# Patient Record
Sex: Female | Born: 1985 | Race: White | Hispanic: No | Marital: Single | Smoking: Never smoker
Health system: Southern US, Community
[De-identification: ages and names within clinical notes are randomized; demographics above are authoritative.]

## PROBLEM LIST (undated history)

## (undated) VITALS — BP 135/93 | HR 84 | Temp 97.7°F | Resp 14 | Ht 63.0 in | Wt 194.0 lb

## (undated) DIAGNOSIS — K567 Ileus, unspecified: Secondary | ICD-10-CM

## (undated) DIAGNOSIS — Z9889 Other specified postprocedural states: Secondary | ICD-10-CM

## (undated) DIAGNOSIS — E282 Polycystic ovarian syndrome: Secondary | ICD-10-CM

## (undated) DIAGNOSIS — I10 Essential (primary) hypertension: Secondary | ICD-10-CM

## (undated) DIAGNOSIS — F329 Major depressive disorder, single episode, unspecified: Secondary | ICD-10-CM

## (undated) DIAGNOSIS — G894 Chronic pain syndrome: Secondary | ICD-10-CM

## (undated) DIAGNOSIS — E3121 Multiple endocrine neoplasia [MEN] type I: Secondary | ICD-10-CM

## (undated) DIAGNOSIS — R112 Nausea with vomiting, unspecified: Secondary | ICD-10-CM

## (undated) DIAGNOSIS — G43909 Migraine, unspecified, not intractable, without status migrainosus: Secondary | ICD-10-CM

## (undated) DIAGNOSIS — F32A Depression, unspecified: Secondary | ICD-10-CM

## (undated) DIAGNOSIS — E213 Hyperparathyroidism, unspecified: Secondary | ICD-10-CM

## (undated) DIAGNOSIS — C801 Malignant (primary) neoplasm, unspecified: Secondary | ICD-10-CM

## (undated) DIAGNOSIS — F319 Bipolar disorder, unspecified: Secondary | ICD-10-CM

## (undated) DIAGNOSIS — S82899A Other fracture of unspecified lower leg, initial encounter for closed fracture: Secondary | ICD-10-CM

## (undated) DIAGNOSIS — F29 Unspecified psychosis not due to a substance or known physiological condition: Secondary | ICD-10-CM

## (undated) DIAGNOSIS — Z8489 Family history of other specified conditions: Secondary | ICD-10-CM

## (undated) DIAGNOSIS — M436 Torticollis: Secondary | ICD-10-CM

## (undated) DIAGNOSIS — M797 Fibromyalgia: Secondary | ICD-10-CM

## (undated) DIAGNOSIS — E063 Autoimmune thyroiditis: Secondary | ICD-10-CM

## (undated) DIAGNOSIS — Z87442 Personal history of urinary calculi: Secondary | ICD-10-CM

## (undated) DIAGNOSIS — K219 Gastro-esophageal reflux disease without esophagitis: Secondary | ICD-10-CM

## (undated) DIAGNOSIS — E039 Hypothyroidism, unspecified: Secondary | ICD-10-CM

## (undated) DIAGNOSIS — D352 Benign neoplasm of pituitary gland: Secondary | ICD-10-CM

## (undated) DIAGNOSIS — I499 Cardiac arrhythmia, unspecified: Secondary | ICD-10-CM

## (undated) DIAGNOSIS — K08409 Partial loss of teeth, unspecified cause, unspecified class: Secondary | ICD-10-CM

## (undated) HISTORY — PX: PARATHYROIDECTOMY: SHX19

## (undated) HISTORY — PX: LAPAROSCOPY: SHX197

## (undated) HISTORY — PX: DILATION AND CURETTAGE OF UTERUS: SHX78

## (undated) HISTORY — PX: OTHER SURGICAL HISTORY: SHX169

## (undated) HISTORY — PX: THYROIDECTOMY: SHX17

## (undated) HISTORY — PX: DENTAL SURGERY: SHX609

---
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
## 2005-01-06 ENCOUNTER — Ambulatory Visit (HOSPITAL_COMMUNITY): Admission: RE | Admit: 2005-01-06 | Discharge: 2005-01-06 | Payer: Self-pay | Admitting: "Endocrinology

## 2005-02-05 ENCOUNTER — Encounter: Admission: RE | Admit: 2005-02-05 | Discharge: 2005-02-05 | Payer: Self-pay | Admitting: Internal Medicine

## 2005-02-12 ENCOUNTER — Ambulatory Visit: Payer: Self-pay | Admitting: "Endocrinology

## 2005-03-09 ENCOUNTER — Ambulatory Visit (HOSPITAL_COMMUNITY): Admission: RE | Admit: 2005-03-09 | Discharge: 2005-03-09 | Payer: Self-pay | Admitting: "Endocrinology

## 2005-05-27 ENCOUNTER — Encounter: Admission: RE | Admit: 2005-05-27 | Discharge: 2005-05-27 | Payer: Self-pay | Admitting: Internal Medicine

## 2005-05-31 ENCOUNTER — Ambulatory Visit: Payer: Self-pay | Admitting: "Endocrinology

## 2005-09-23 ENCOUNTER — Ambulatory Visit (HOSPITAL_COMMUNITY): Payer: Self-pay | Admitting: *Deleted

## 2005-10-11 ENCOUNTER — Ambulatory Visit: Payer: Self-pay | Admitting: "Endocrinology

## 2005-12-21 ENCOUNTER — Ambulatory Visit (HOSPITAL_COMMUNITY): Payer: Self-pay | Admitting: *Deleted

## 2006-03-22 ENCOUNTER — Ambulatory Visit (HOSPITAL_COMMUNITY): Payer: Self-pay | Admitting: *Deleted

## 2006-05-14 IMAGING — CR DG CHEST 2V
2 series · 2 of 2 positions shown · non-contrast
Comparison: none

CLINICAL DATA: Pain.
 CHEST X-RAY:
 Two views of the chest show the lungs to be clear.  The heart is within normal limits in size. No acute bony abnormality is seen.

[w chest pa]
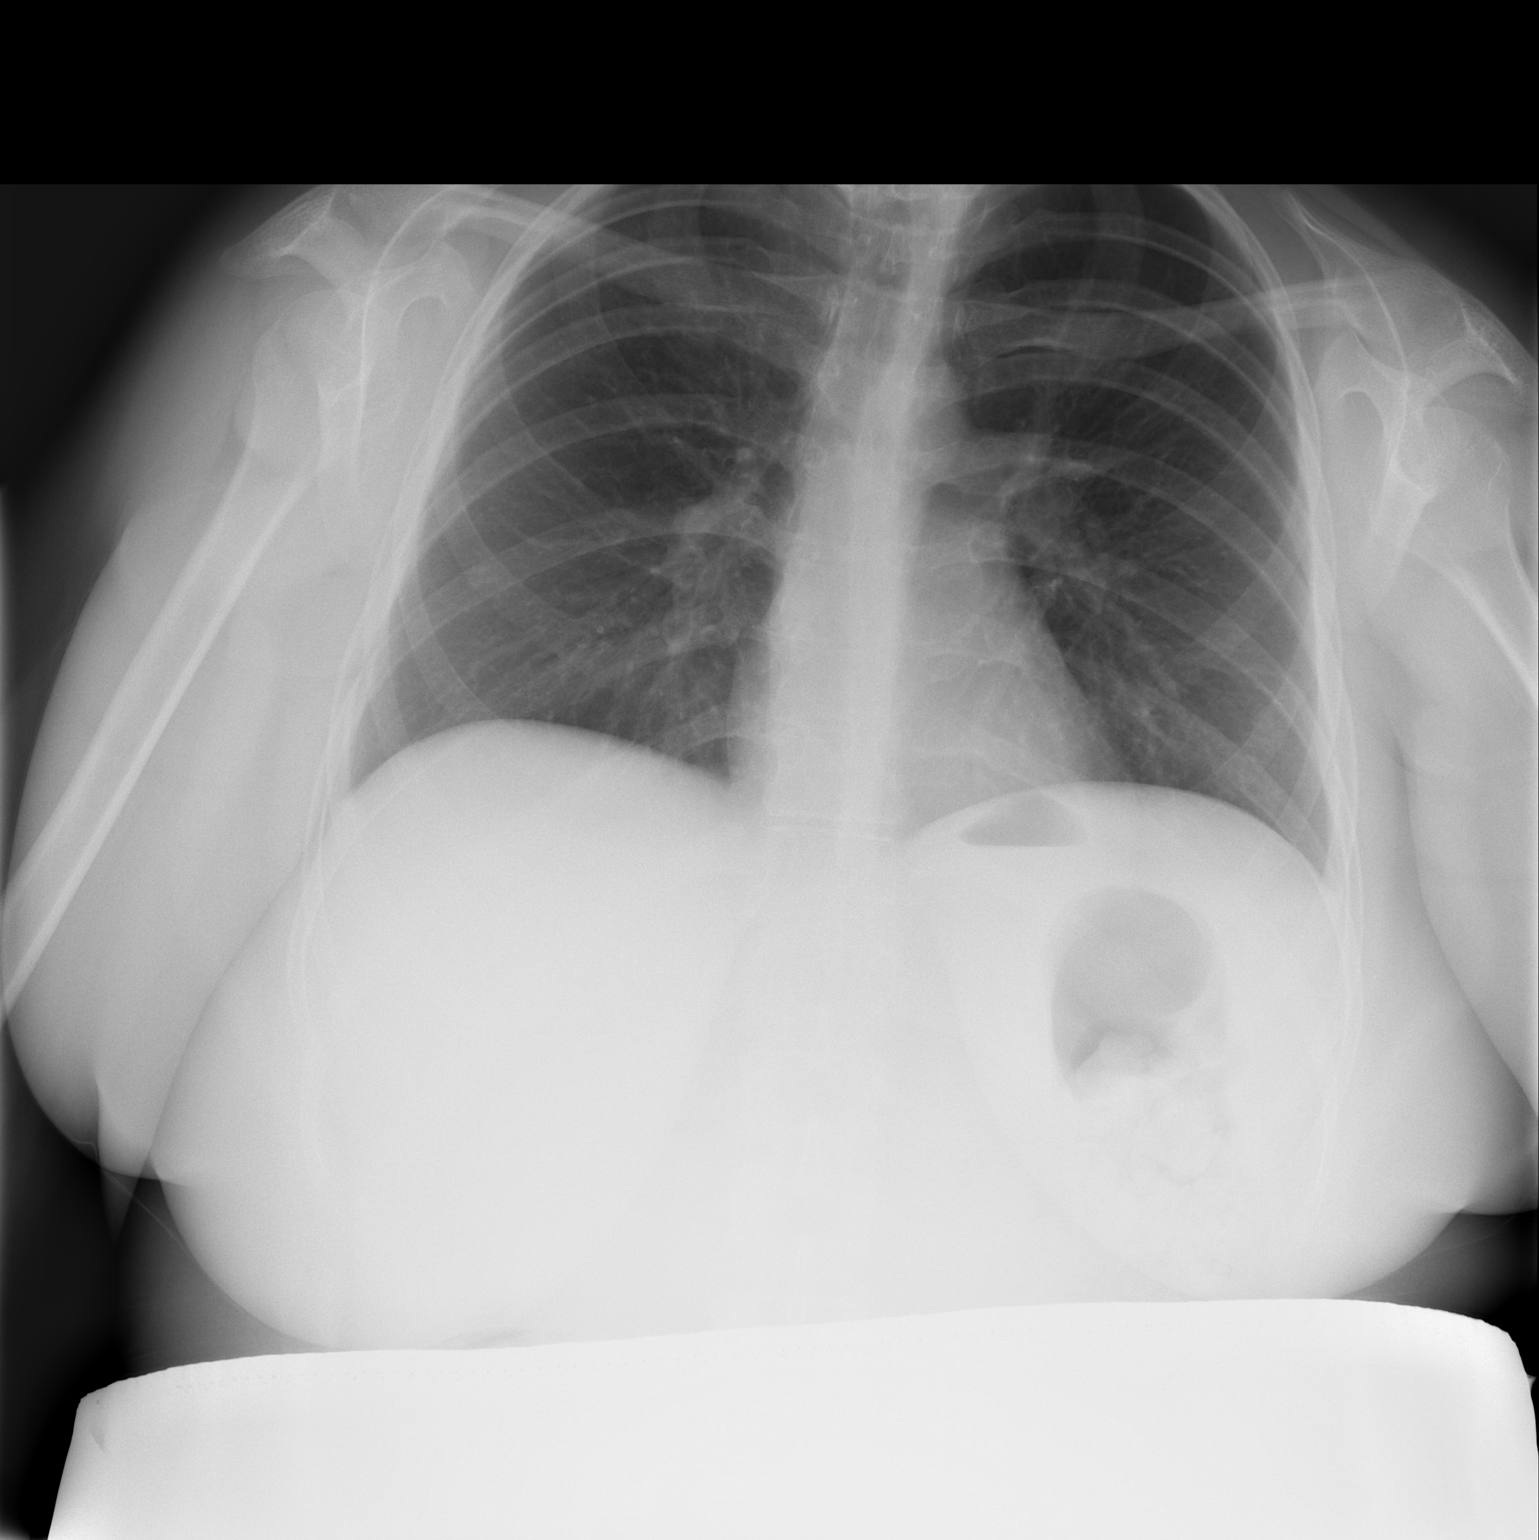

[w chest lat]
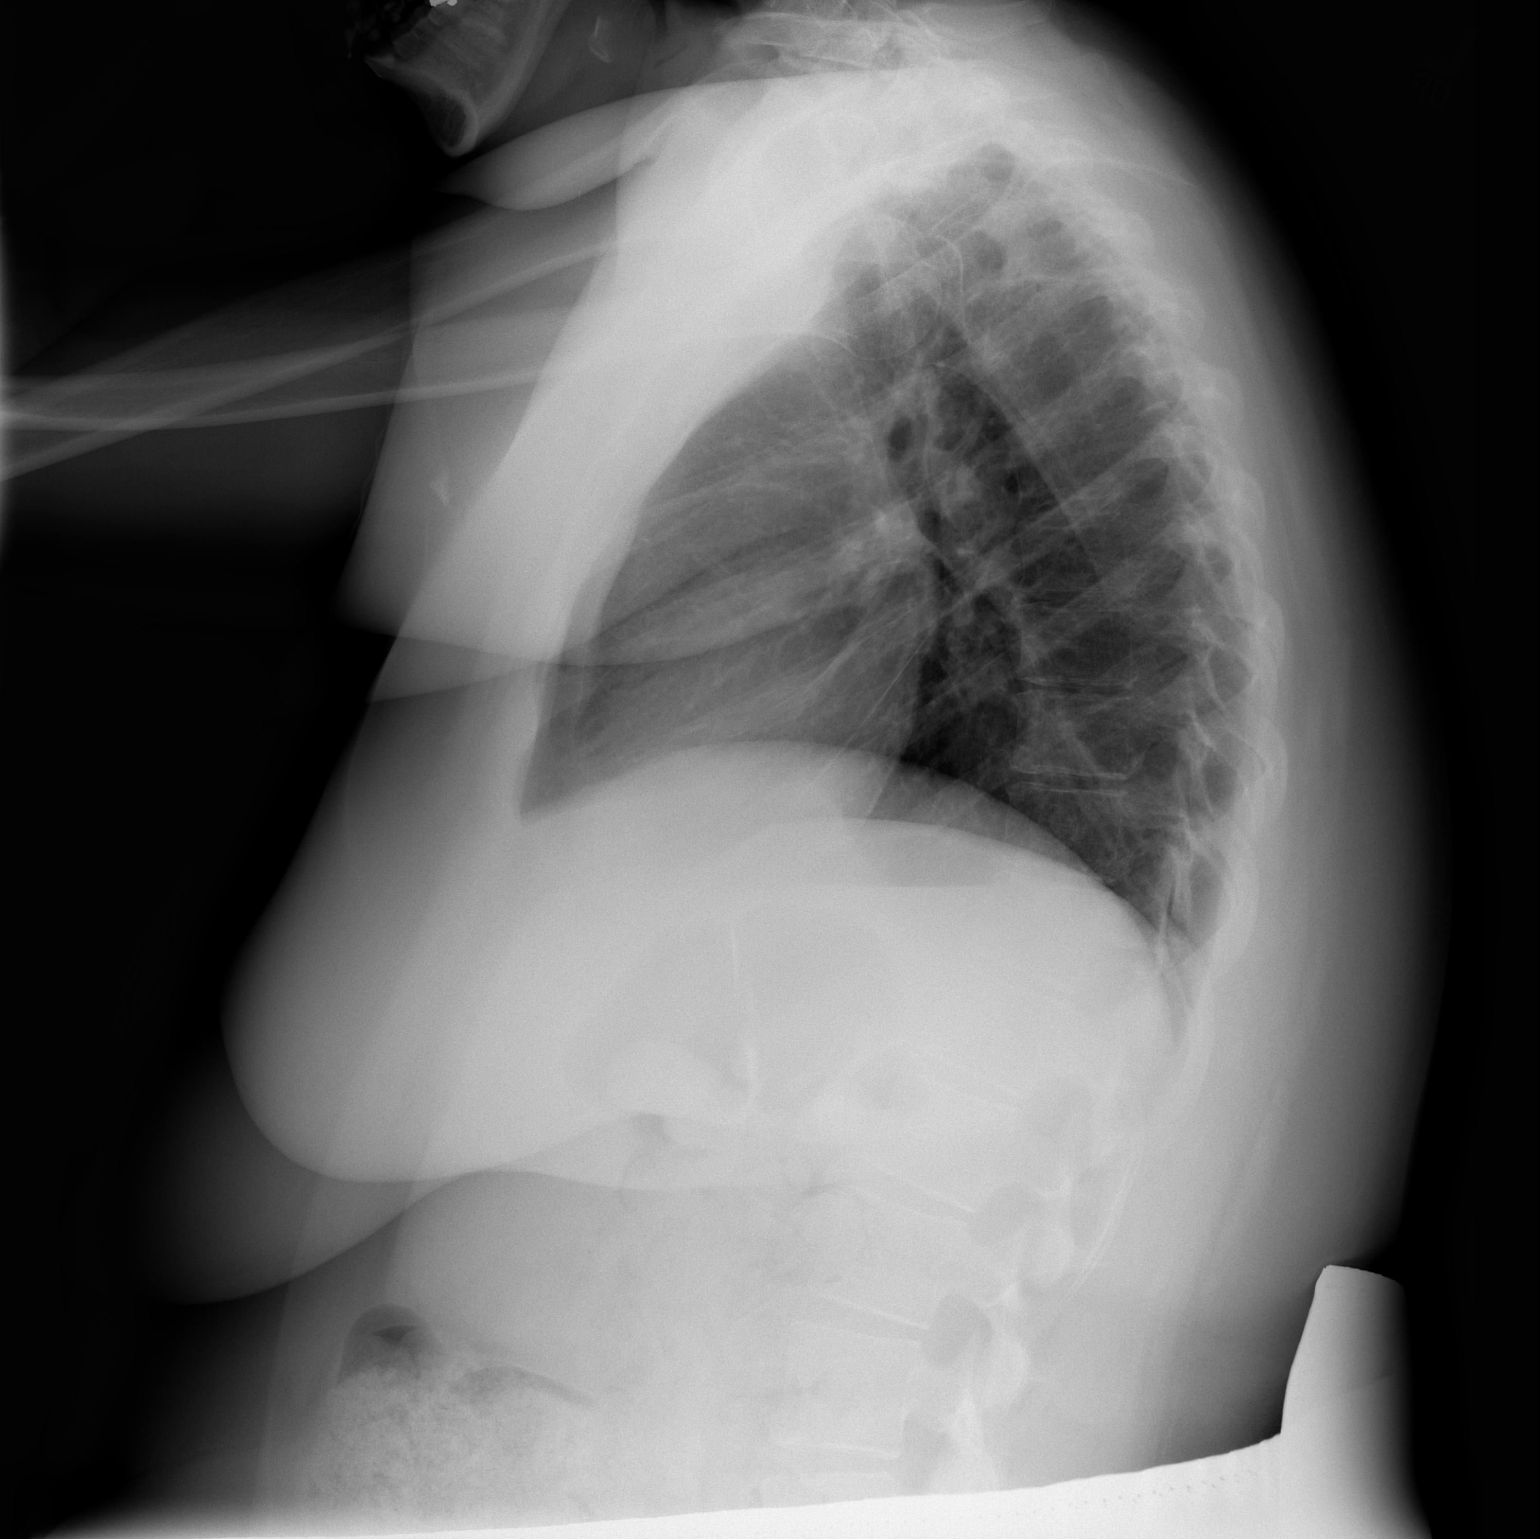

[2 of 2 positions shown; findings below may reference images not displayed]

IMPRESSION: No active lung disease.

## 2006-05-14 IMAGING — US US PELVIS COMPLETE MODIFY
2 series · 14 of 25 positions shown · non-contrast
Comparison: none

CLINICAL DATA: Pelvic pain, pain with urination.
 PELVIC ULTRASOUND WITH TRANSVAGINAL:
 Transabdominal and transvaginal ultrasound of the pelvis were performed.  The uterus measures 6.6 cm sagittally with a depth of 2.3 cm and width of 4.5 cm.  The endometrium is normal measuring 5.4 mm in thickness.  Both ovaries are normal in size.  No free fluid is seen.

[Series 1: unknown · 0.15mm/px · 7 of 25 slices shown (1 of 2)]
[im 1/25]
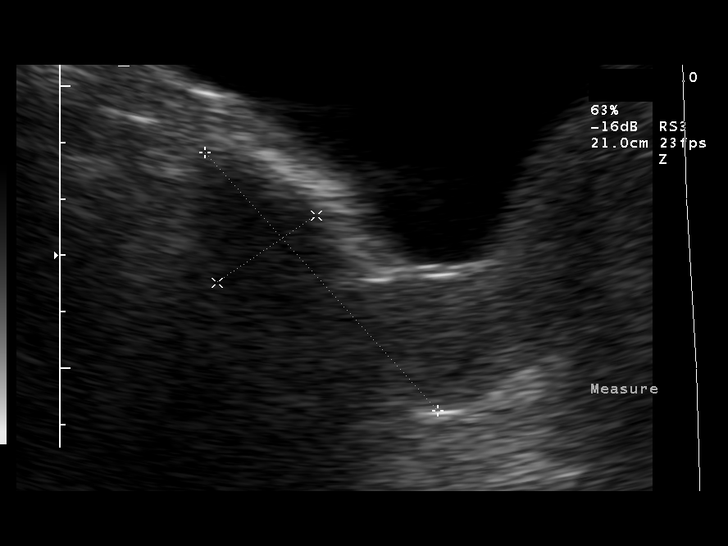
[im 5/25]
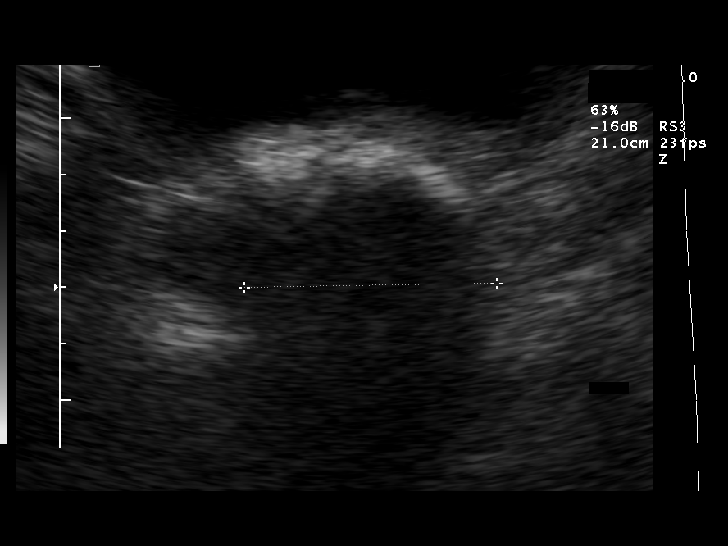
[im 9/25]
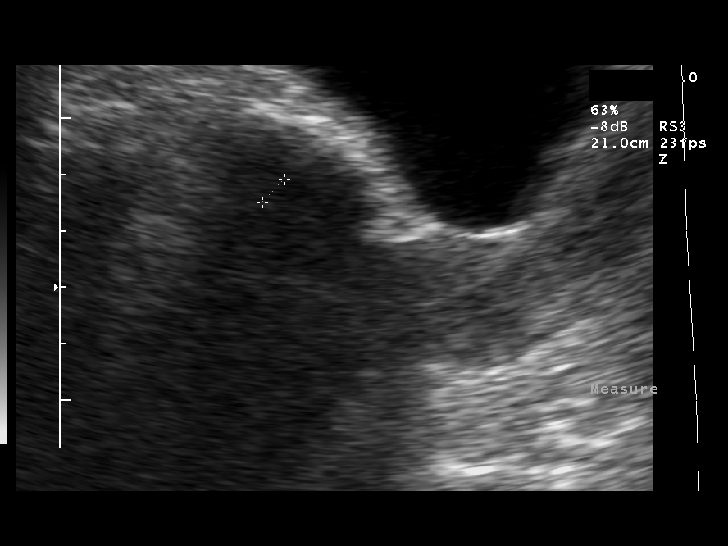
[im 13/25]
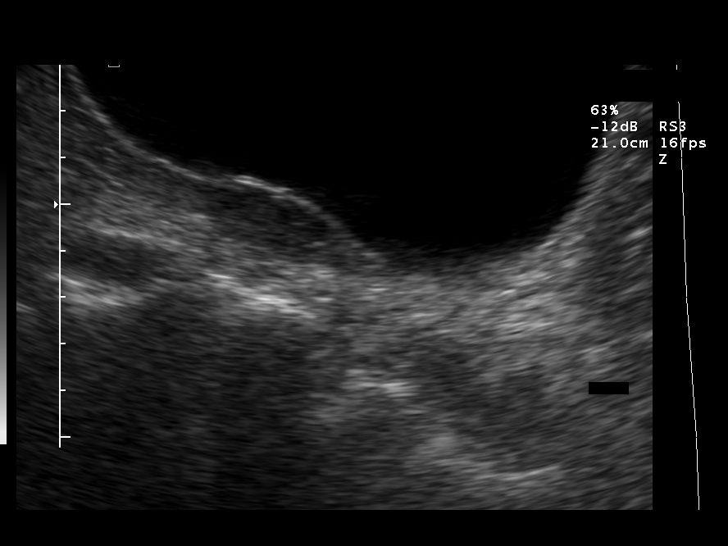
[im 17/25]
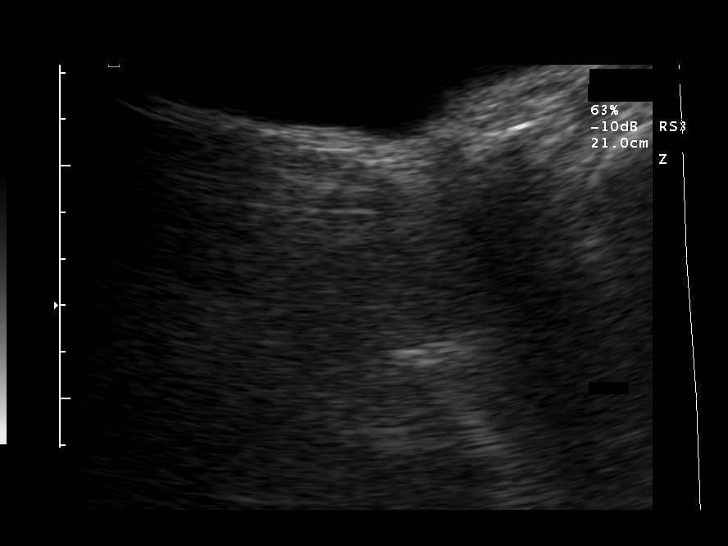
[im 19/25]
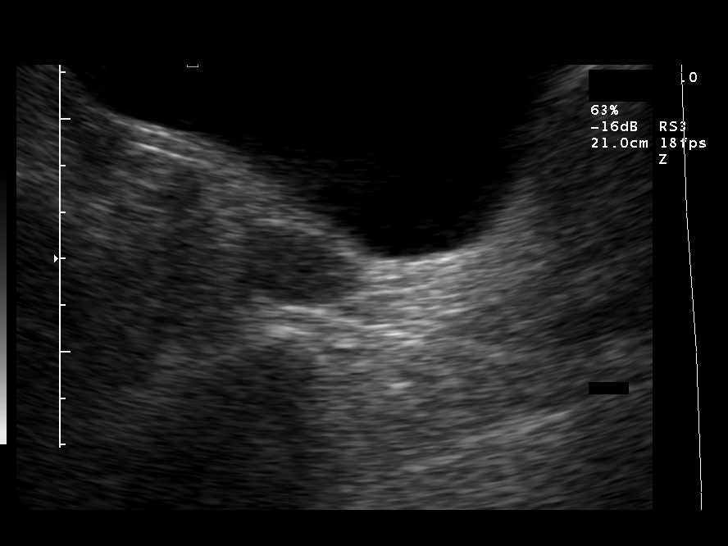
[im 23/25]
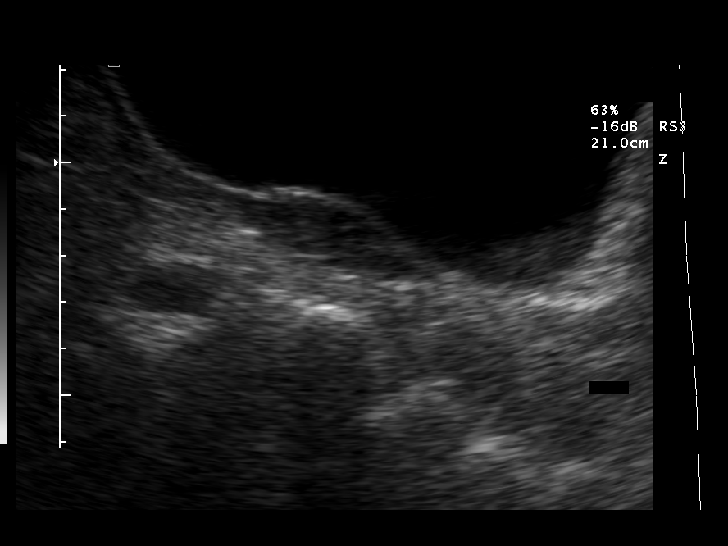

[Series 1: unknown · 0.12mm/px · 7 of 25 slices shown (2 of 2)]
[im 1/25]
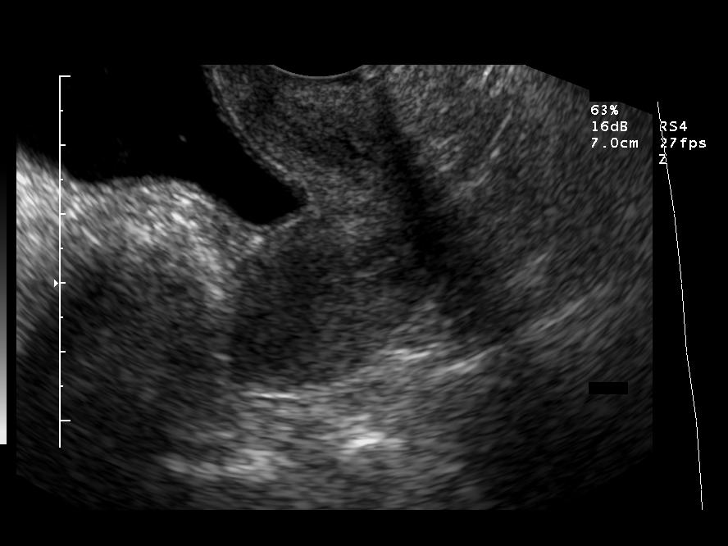
[im 5/25]
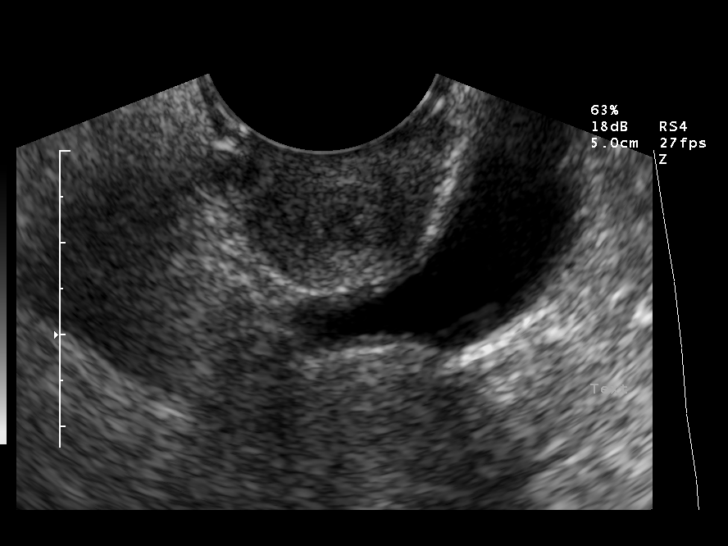
[im 7/25]
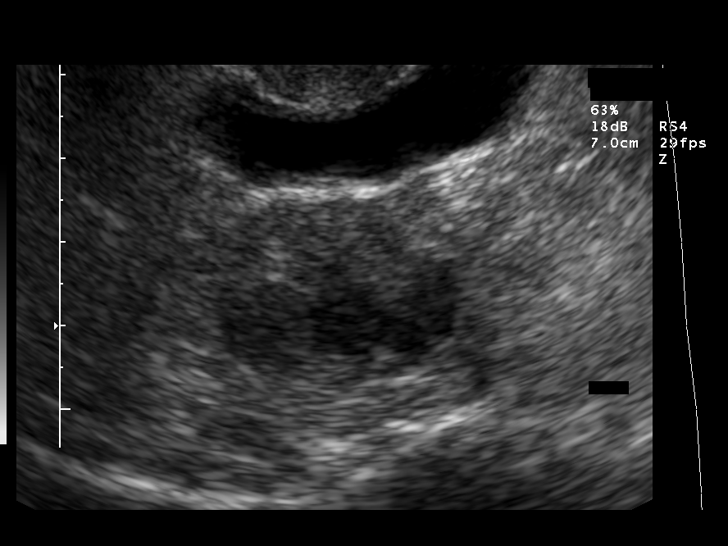
[im 11/25]
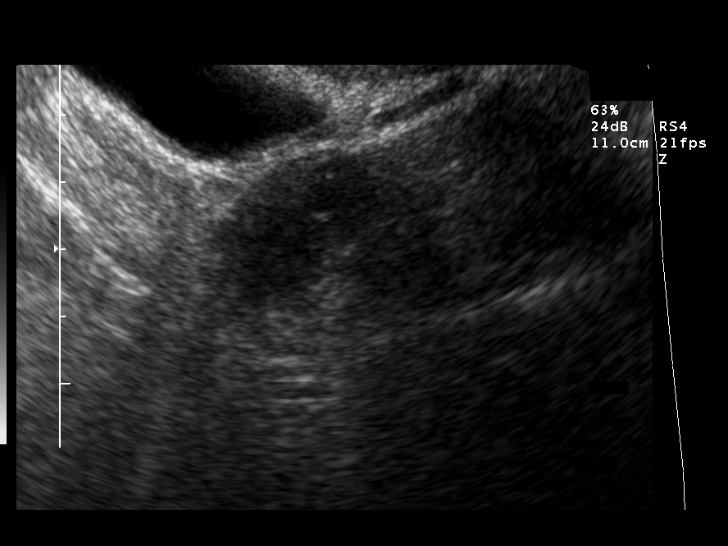
[im 16/25]
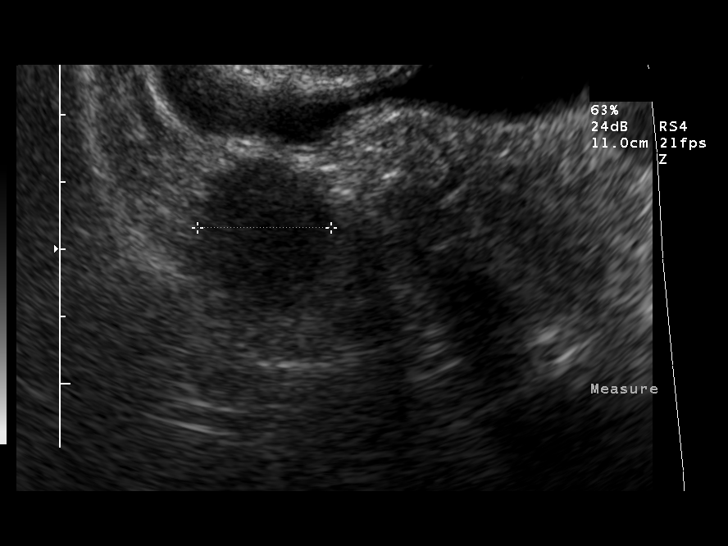
[im 20/25]
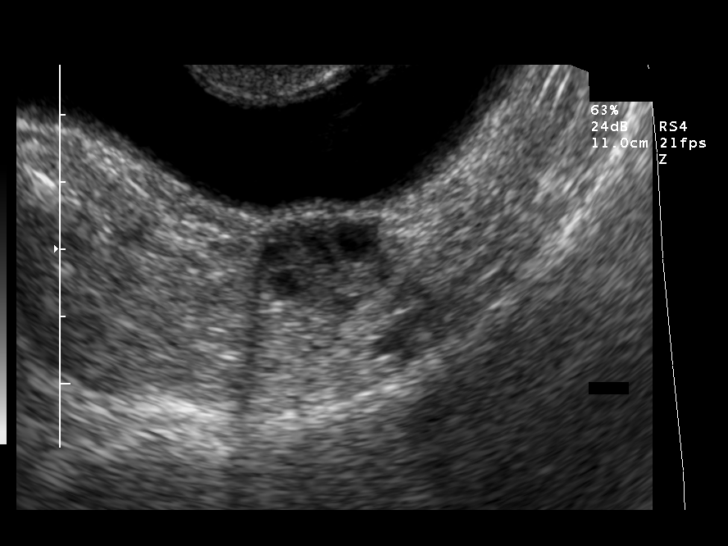
[im 25/25]
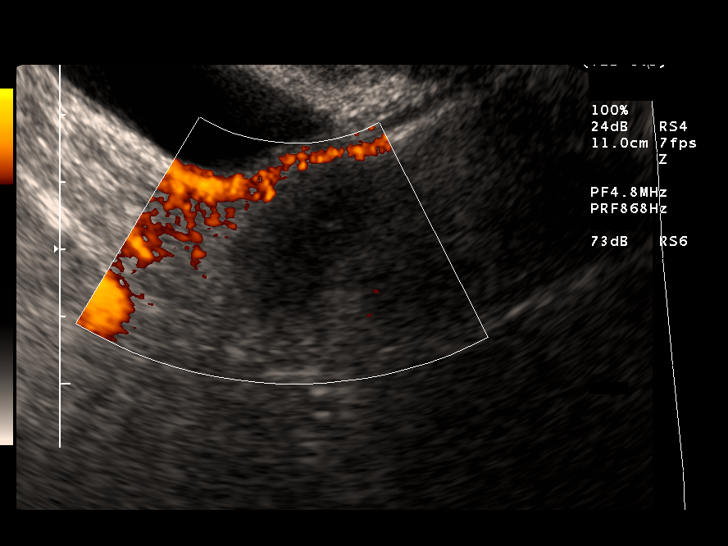

[14 of 25 positions shown; findings below may reference images not displayed]

IMPRESSION: Negative pelvic ultrasound.

## 2006-05-14 IMAGING — US US RENAL
1 series · 14 of 25 positions shown · non-contrast
Comparison: None.

CLINICAL DATA: Pelvic pain with urination.
 RENAL SONOGRAM:

[Series 1: unknown · 0.18mm/px · 14 of 34 slices shown]
[im 1/34]
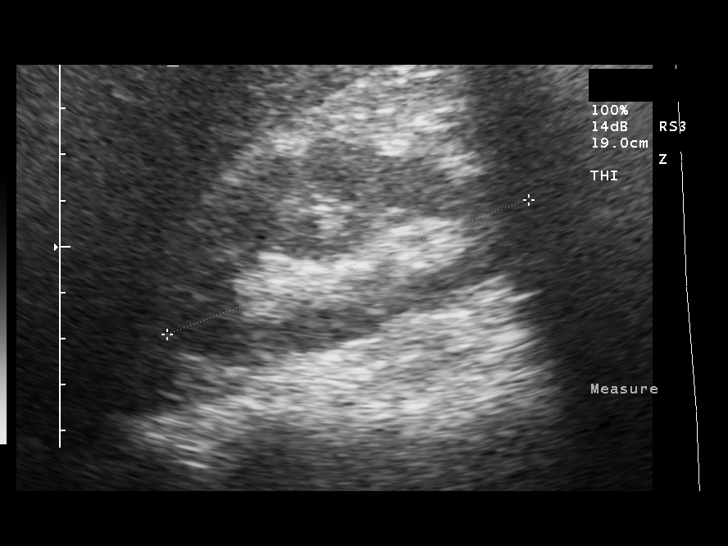
[im 3/34]
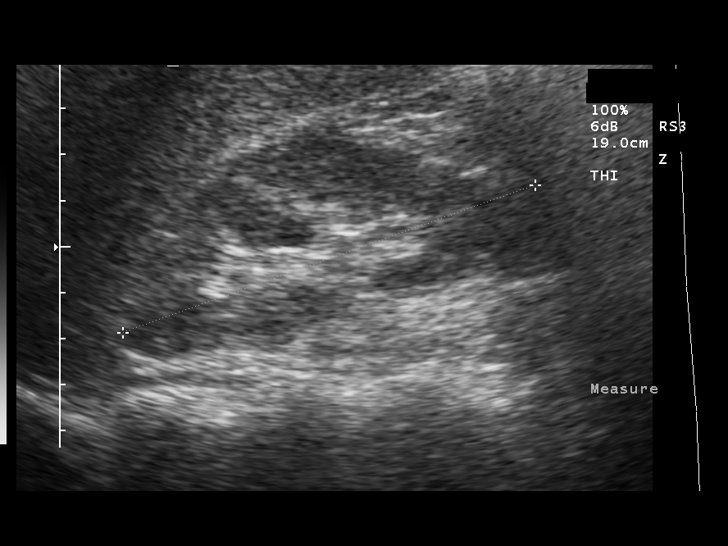
[im 6/34]
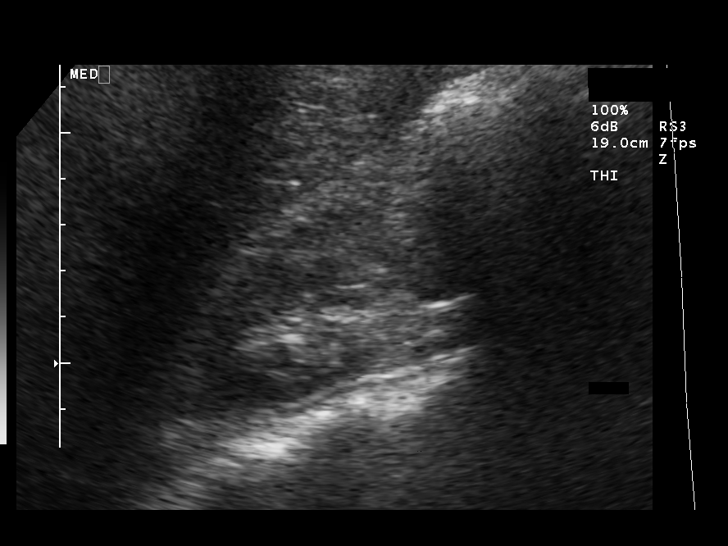
[im 9/34]
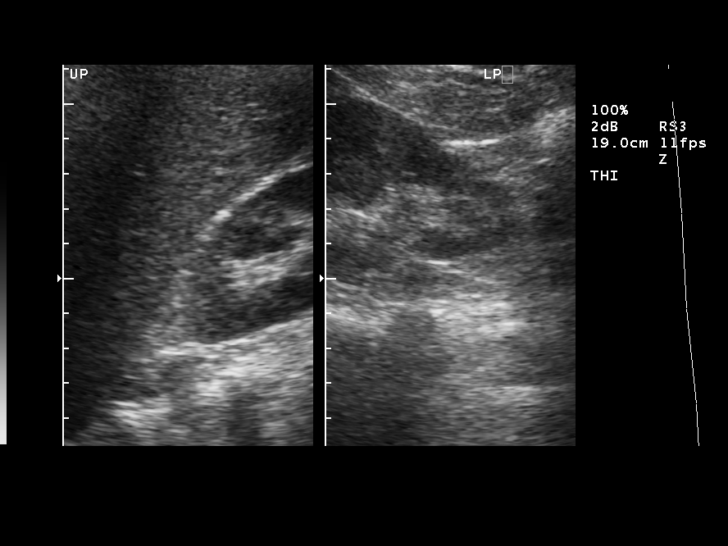
[im 12/34]
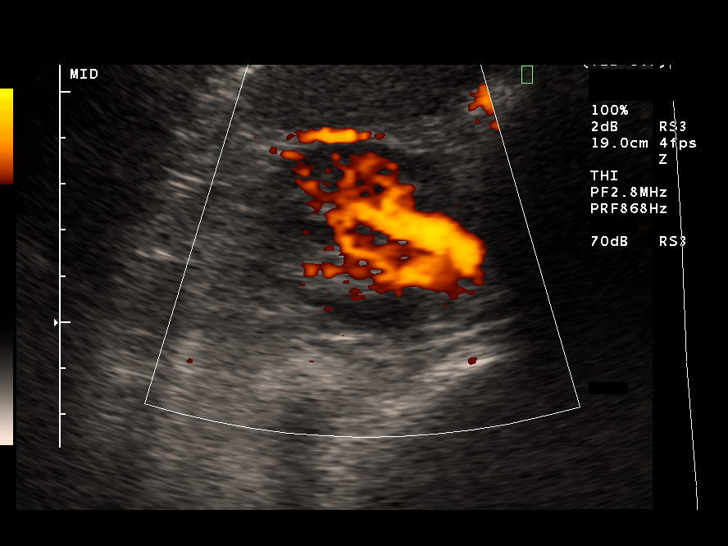
[im 13/34]
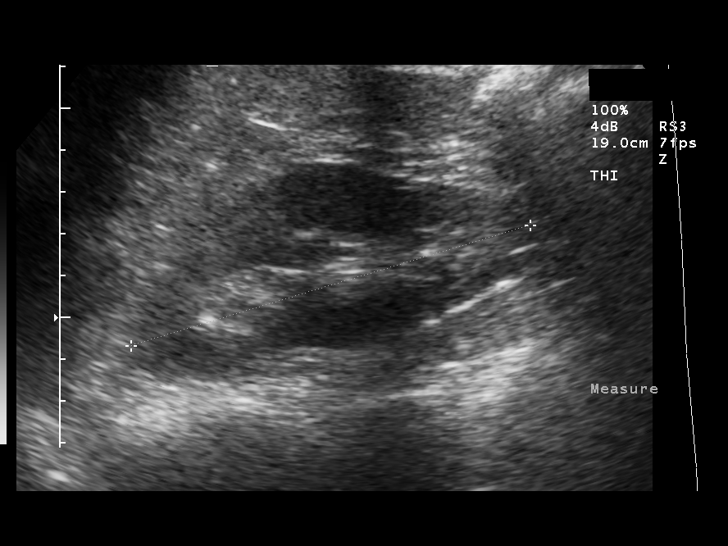
[im 16/34]
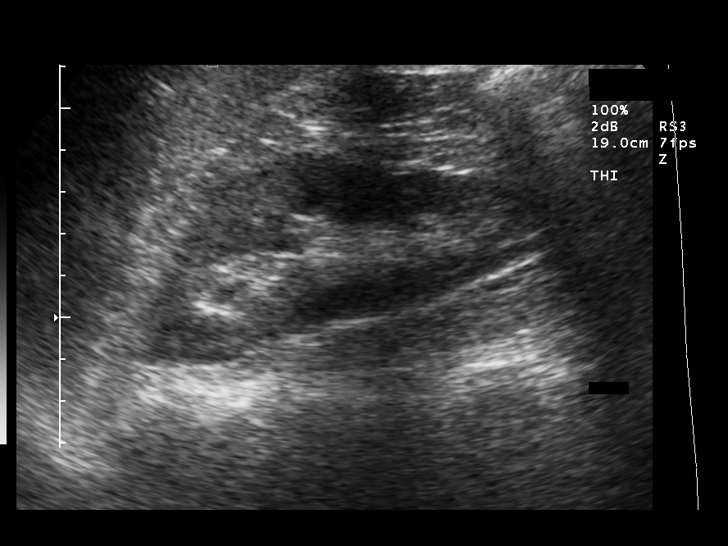
[im 18/34]
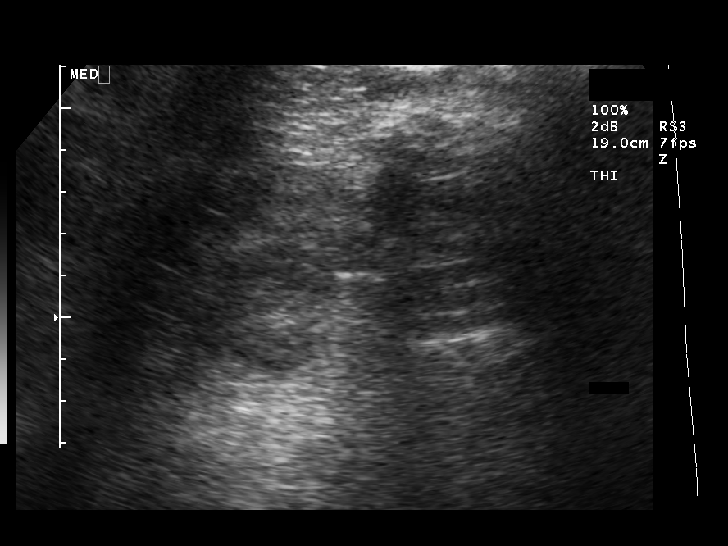
[im 21/34]
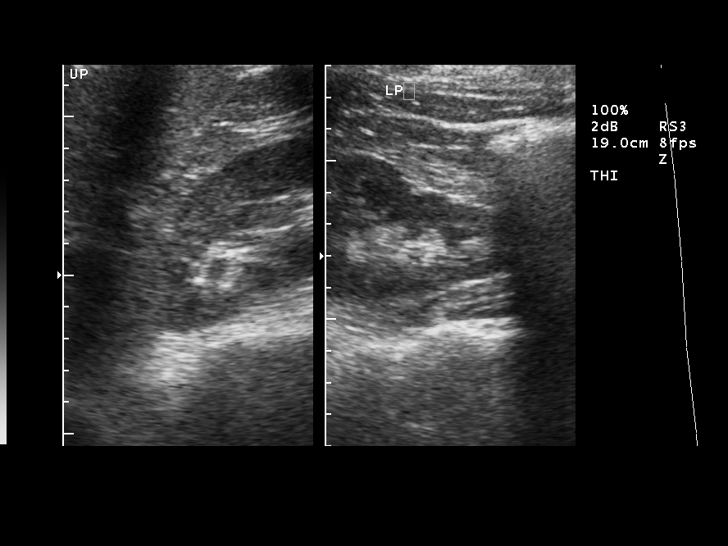
[im 23/34]
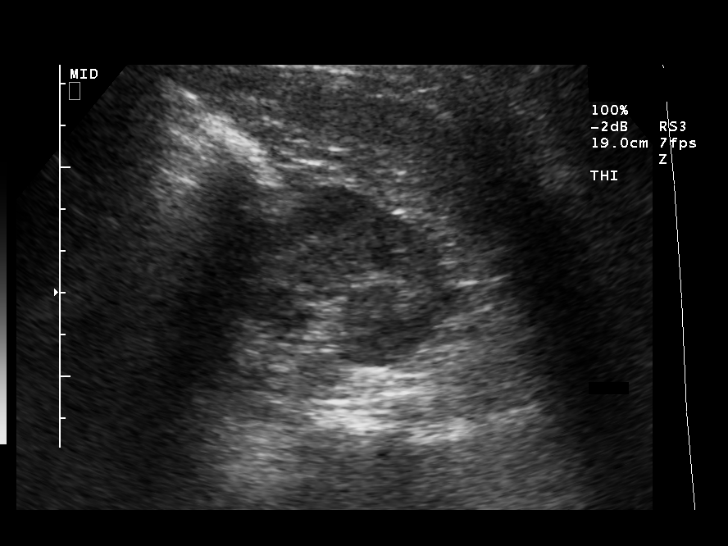
[im 25/34]
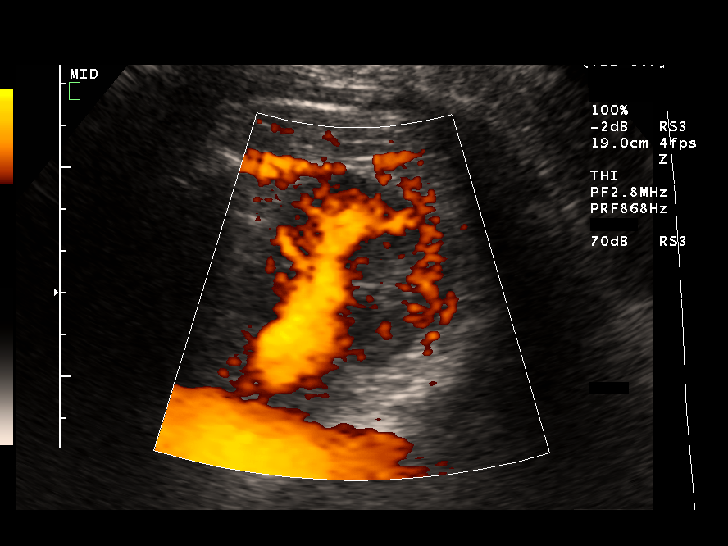
[im 28/34]
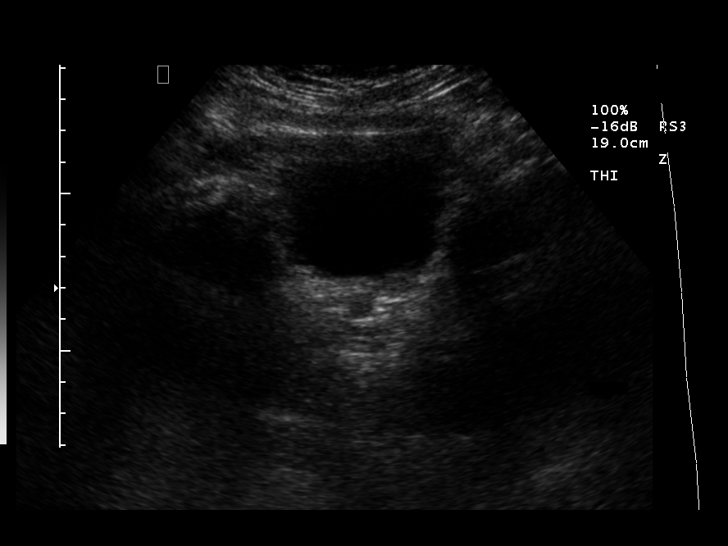
[im 31/34]
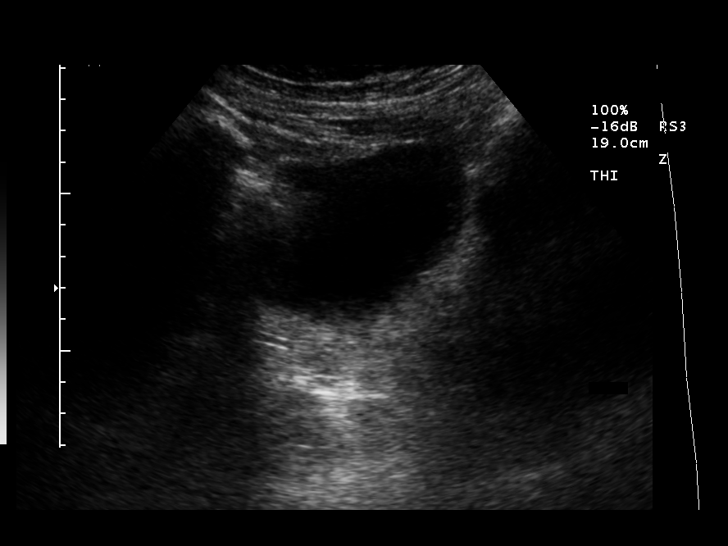
[im 34/34]
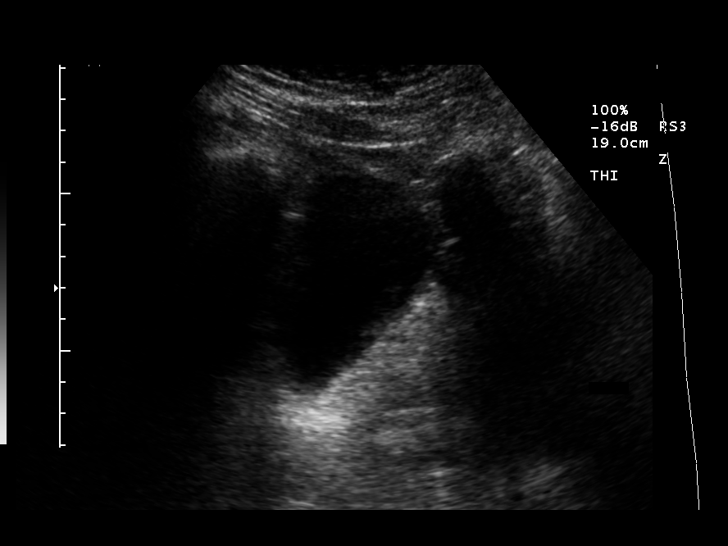

[14 of 25 positions shown; findings below may reference images not displayed]

FINDINGS: Right kidney is normal in appearance measuring 9.5 cm.  
 Left kidney is also normal measuring 10.1 cm. 
 A significant amount of post void residual was noted.  The bladder, however, was otherwise normal in appearance.
IMPRESSION: 1.  Normal kidneys. 
 2.  Significant post void residual within a normal appearing bladder.

## 2006-05-26 ENCOUNTER — Ambulatory Visit (HOSPITAL_COMMUNITY): Payer: Self-pay | Admitting: *Deleted

## 2006-08-30 ENCOUNTER — Ambulatory Visit (HOSPITAL_COMMUNITY): Payer: Self-pay | Admitting: *Deleted

## 2006-10-27 ENCOUNTER — Ambulatory Visit: Payer: Self-pay | Admitting: "Endocrinology

## 2006-11-01 ENCOUNTER — Ambulatory Visit (HOSPITAL_COMMUNITY): Payer: Self-pay | Admitting: *Deleted

## 2006-12-06 ENCOUNTER — Encounter (HOSPITAL_COMMUNITY): Admission: RE | Admit: 2006-12-06 | Discharge: 2007-02-10 | Payer: Self-pay | Admitting: Internal Medicine

## 2007-01-09 ENCOUNTER — Ambulatory Visit: Payer: Self-pay | Admitting: "Endocrinology

## 2007-01-09 ENCOUNTER — Emergency Department (HOSPITAL_COMMUNITY): Admission: EM | Admit: 2007-01-09 | Discharge: 2007-01-09 | Payer: Self-pay | Admitting: Emergency Medicine

## 2007-01-12 ENCOUNTER — Ambulatory Visit: Payer: Self-pay | Admitting: Oncology

## 2007-02-13 LAB — CBC & DIFF AND RETIC
BASO%: 0.4 % (ref 0.0–2.0)
HCT: 37.5 % (ref 34.8–46.6)
IRF: 0.32 (ref 0.130–0.330)
MCHC: 34.4 g/dL (ref 32.0–36.0)
MONO#: 0.4 10*3/uL (ref 0.1–0.9)
NEUT%: 51 % (ref 39.6–76.8)
RDW: 14.9 % — ABNORMAL HIGH (ref 11.3–14.5)
RETIC #: 109 10*3/uL (ref 19.7–115.1)
Retic %: 2.3 % (ref 0.4–2.3)
WBC: 4.7 10*3/uL (ref 3.9–10.0)
lymph#: 1.8 10*3/uL (ref 0.9–3.3)

## 2007-02-13 LAB — MORPHOLOGY
PLT EST: ADEQUATE
RBC Comments: NORMAL

## 2007-02-13 LAB — IVY BLEEDING TIME: Bleeding Time: 2 Minutes (ref 2.0–8.0)

## 2007-02-13 LAB — APTT: aPTT: 35 seconds (ref 24–37)

## 2007-02-13 LAB — CHCC SMEAR

## 2007-02-16 LAB — FERRITIN: Ferritin: 30 ng/mL (ref 10–291)

## 2007-02-16 LAB — VON WILLEBRAND PANEL
Factor-VIII Activity: 103 % (ref 75–150)
Ristocetin-Cofactor: 110 % (ref 50–150)
Von Willebrand Ag: 59 % normal — ABNORMAL LOW (ref 61–164)

## 2007-02-16 LAB — IRON AND TIBC
%SAT: 19 % — ABNORMAL LOW (ref 20–55)
Iron: 58 ug/dL (ref 42–145)

## 2007-03-08 ENCOUNTER — Ambulatory Visit: Payer: Self-pay | Admitting: Oncology

## 2007-04-26 ENCOUNTER — Ambulatory Visit: Payer: Self-pay | Admitting: Oncology

## 2007-06-06 ENCOUNTER — Encounter (HOSPITAL_COMMUNITY): Admission: RE | Admit: 2007-06-06 | Discharge: 2007-08-21 | Payer: Self-pay | Admitting: Oncology

## 2007-06-07 LAB — ABO/RH

## 2007-06-08 ENCOUNTER — Ambulatory Visit (HOSPITAL_COMMUNITY): Payer: Self-pay | Admitting: *Deleted

## 2007-06-08 ENCOUNTER — Ambulatory Visit: Payer: Self-pay | Admitting: Oncology

## 2007-06-13 LAB — THROMBIN TIME: Thrombin Time: 17 s (ref 14.7–19.5)

## 2007-06-13 LAB — FIBRINOGEN: Fibrinogen: 388 mg/dL (ref 204–475)

## 2007-06-13 LAB — FACTOR 13, QUAL

## 2007-06-13 LAB — FACTOR 12 ASSAY: Factor XII Activity: 56 % — ABNORMAL LOW (ref 58–166)

## 2008-08-13 ENCOUNTER — Ambulatory Visit (HOSPITAL_COMMUNITY): Payer: Self-pay | Admitting: *Deleted

## 2008-08-22 ENCOUNTER — Ambulatory Visit (HOSPITAL_COMMUNITY): Payer: Self-pay | Admitting: *Deleted

## 2009-02-05 ENCOUNTER — Ambulatory Visit (HOSPITAL_COMMUNITY): Payer: Self-pay | Admitting: Psychiatry

## 2009-03-04 ENCOUNTER — Ambulatory Visit (HOSPITAL_COMMUNITY): Payer: Self-pay | Admitting: Marriage and Family Therapist

## 2009-03-07 ENCOUNTER — Ambulatory Visit (HOSPITAL_COMMUNITY): Payer: Self-pay | Admitting: Psychiatry

## 2009-03-18 ENCOUNTER — Ambulatory Visit (HOSPITAL_COMMUNITY): Payer: Self-pay | Admitting: Psychiatry

## 2009-04-08 ENCOUNTER — Ambulatory Visit (HOSPITAL_COMMUNITY): Payer: Self-pay | Admitting: Psychiatry

## 2009-04-16 ENCOUNTER — Ambulatory Visit (HOSPITAL_COMMUNITY): Payer: Self-pay | Admitting: Marriage and Family Therapist

## 2009-08-08 ENCOUNTER — Ambulatory Visit (HOSPITAL_COMMUNITY): Payer: Self-pay | Admitting: Psychiatry

## 2009-09-16 ENCOUNTER — Ambulatory Visit (HOSPITAL_COMMUNITY): Payer: Self-pay | Admitting: Psychiatry

## 2010-03-13 ENCOUNTER — Ambulatory Visit (HOSPITAL_COMMUNITY): Payer: Self-pay | Admitting: Psychiatry

## 2010-04-26 ENCOUNTER — Encounter: Payer: Self-pay | Admitting: Neurology

## 2010-04-26 ENCOUNTER — Encounter: Payer: Self-pay | Admitting: Internal Medicine

## 2010-07-08 ENCOUNTER — Encounter (INDEPENDENT_AMBULATORY_CARE_PROVIDER_SITE_OTHER): Payer: PRIVATE HEALTH INSURANCE | Admitting: Physician Assistant

## 2010-07-08 DIAGNOSIS — F319 Bipolar disorder, unspecified: Secondary | ICD-10-CM

## 2010-09-09 ENCOUNTER — Encounter (HOSPITAL_COMMUNITY): Payer: PRIVATE HEALTH INSURANCE | Admitting: Physician Assistant

## 2010-09-10 ENCOUNTER — Encounter (HOSPITAL_COMMUNITY): Payer: Medicare Other | Admitting: Physician Assistant

## 2010-09-10 DIAGNOSIS — F410 Panic disorder [episodic paroxysmal anxiety] without agoraphobia: Secondary | ICD-10-CM

## 2010-09-10 DIAGNOSIS — F39 Unspecified mood [affective] disorder: Secondary | ICD-10-CM

## 2010-11-04 ENCOUNTER — Encounter (HOSPITAL_COMMUNITY): Payer: Medicare Other | Admitting: Physician Assistant

## 2010-12-03 ENCOUNTER — Encounter (INDEPENDENT_AMBULATORY_CARE_PROVIDER_SITE_OTHER): Payer: Medicare Other | Admitting: Physician Assistant

## 2010-12-03 DIAGNOSIS — F39 Unspecified mood [affective] disorder: Secondary | ICD-10-CM

## 2010-12-28 LAB — ABO/RH: ABO/RH(D): A POS

## 2011-01-14 LAB — CBC
MCHC: 33.3
Platelets: 234
RDW: 14.9 — ABNORMAL HIGH

## 2011-01-14 LAB — APTT: aPTT: 33

## 2011-01-14 LAB — DIFFERENTIAL
Basophils Absolute: 0
Basophils Relative: 0
Lymphocytes Relative: 33
Neutro Abs: 2.7
Neutrophils Relative %: 57

## 2011-01-14 LAB — PROTIME-INR: Prothrombin Time: 13.5

## 2011-02-08 ENCOUNTER — Other Ambulatory Visit (HOSPITAL_COMMUNITY): Payer: Self-pay | Admitting: Physician Assistant

## 2011-02-08 NOTE — Telephone Encounter (Signed)
Pt. Requested a refill on her amitriptyline and this was declined as she was to have followed up in 3 months from her last visit in June, and she has not done so.  Pharmacy was asked to have the patient schedule and appointment with her provider.

## 2011-02-10 ENCOUNTER — Other Ambulatory Visit (HOSPITAL_COMMUNITY): Payer: Self-pay | Admitting: *Deleted

## 2011-02-10 DIAGNOSIS — F39 Unspecified mood [affective] disorder: Secondary | ICD-10-CM

## 2011-02-10 MED ORDER — AMITRIPTYLINE HCL 100 MG PO TABS: ORAL_TABLET | ORAL | Status: DC

## 2011-02-16 ENCOUNTER — Telehealth (HOSPITAL_COMMUNITY): Payer: Self-pay | Admitting: Physician Assistant

## 2011-02-19 NOTE — Telephone Encounter (Signed)
Pt's mother returned call. Pt is schedule to undergo surgery to remove most teeth and will not be able to make an appt for an extended time. Writer will continue meds until pt is seen.   By Jorje Guild, PA

## 2011-02-22 ENCOUNTER — Other Ambulatory Visit (HOSPITAL_COMMUNITY): Payer: Self-pay | Admitting: Physician Assistant

## 2011-02-22 DIAGNOSIS — F319 Bipolar disorder, unspecified: Secondary | ICD-10-CM

## 2011-02-22 MED ORDER — LAMOTRIGINE 150 MG PO TABS: 150.0000 mg | ORAL_TABLET | Freq: Every day | ORAL | Status: DC

## 2011-02-22 MED ORDER — ZOLPIDEM TARTRATE 10 MG PO TABS: ORAL_TABLET | ORAL | Status: DC

## 2011-02-24 ENCOUNTER — Other Ambulatory Visit (HOSPITAL_COMMUNITY): Payer: Self-pay | Admitting: Physician Assistant

## 2011-03-04 ENCOUNTER — Ambulatory Visit (HOSPITAL_COMMUNITY): Payer: No Typology Code available for payment source | Admitting: Physician Assistant

## 2011-03-16 ENCOUNTER — Other Ambulatory Visit (HOSPITAL_COMMUNITY): Payer: Self-pay | Admitting: Psychology

## 2011-03-16 DIAGNOSIS — F319 Bipolar disorder, unspecified: Secondary | ICD-10-CM

## 2011-03-16 DIAGNOSIS — G4700 Insomnia, unspecified: Secondary | ICD-10-CM

## 2011-03-16 MED ORDER — ZOLPIDEM TARTRATE 10 MG PO TABS: ORAL_TABLET | ORAL | Status: DC

## 2011-03-17 ENCOUNTER — Other Ambulatory Visit (HOSPITAL_COMMUNITY): Payer: Self-pay | Admitting: *Deleted

## 2011-03-17 DIAGNOSIS — F39 Unspecified mood [affective] disorder: Secondary | ICD-10-CM

## 2011-03-17 MED ORDER — AMITRIPTYLINE HCL 100 MG PO TABS: ORAL_TABLET | ORAL | Status: DC

## 2011-03-25 ENCOUNTER — Other Ambulatory Visit (HOSPITAL_COMMUNITY): Payer: Self-pay | Admitting: Psychiatry

## 2011-03-25 DIAGNOSIS — F319 Bipolar disorder, unspecified: Secondary | ICD-10-CM

## 2011-03-25 MED ORDER — VENLAFAXINE HCL ER 150 MG PO TB24: 150.0000 mg | ORAL_TABLET | Freq: Every day | ORAL | Status: DC

## 2011-03-25 MED ORDER — LAMOTRIGINE 150 MG PO TABS: 150.0000 mg | ORAL_TABLET | Freq: Every day | ORAL | Status: DC

## 2011-03-31 ENCOUNTER — Other Ambulatory Visit (HOSPITAL_COMMUNITY): Payer: Self-pay | Admitting: Physician Assistant

## 2011-04-09 ENCOUNTER — Other Ambulatory Visit (HOSPITAL_COMMUNITY): Payer: Self-pay | Admitting: Physician Assistant

## 2011-04-09 DIAGNOSIS — F319 Bipolar disorder, unspecified: Secondary | ICD-10-CM

## 2011-04-09 MED ORDER — VENLAFAXINE HCL ER 150 MG PO TB24: 150.0000 mg | ORAL_TABLET | Freq: Every day | ORAL | Status: DC

## 2011-04-14 ENCOUNTER — Other Ambulatory Visit (HOSPITAL_COMMUNITY): Payer: Self-pay | Admitting: *Deleted

## 2011-04-14 DIAGNOSIS — F319 Bipolar disorder, unspecified: Secondary | ICD-10-CM

## 2011-04-14 MED ORDER — ZOLPIDEM TARTRATE 10 MG PO TABS: ORAL_TABLET | ORAL | Status: DC

## 2011-04-15 ENCOUNTER — Other Ambulatory Visit (HOSPITAL_COMMUNITY): Payer: Self-pay | Admitting: *Deleted

## 2011-04-15 DIAGNOSIS — F39 Unspecified mood [affective] disorder: Secondary | ICD-10-CM

## 2011-04-15 MED ORDER — OLANZAPINE 10 MG PO TBDP: ORAL_TABLET | ORAL | Status: DC

## 2011-04-21 ENCOUNTER — Other Ambulatory Visit (HOSPITAL_COMMUNITY): Payer: Self-pay | Admitting: Physician Assistant

## 2011-04-21 DIAGNOSIS — F319 Bipolar disorder, unspecified: Secondary | ICD-10-CM

## 2011-04-21 MED ORDER — LAMOTRIGINE 150 MG PO TABS: 150.0000 mg | ORAL_TABLET | Freq: Every day | ORAL | Status: DC

## 2011-05-04 ENCOUNTER — Ambulatory Visit (HOSPITAL_COMMUNITY): Payer: No Typology Code available for payment source | Admitting: Physician Assistant

## 2011-05-06 ENCOUNTER — Other Ambulatory Visit (HOSPITAL_COMMUNITY): Payer: Self-pay | Admitting: *Deleted

## 2011-05-06 ENCOUNTER — Other Ambulatory Visit (HOSPITAL_COMMUNITY): Payer: Self-pay

## 2011-05-06 DIAGNOSIS — F319 Bipolar disorder, unspecified: Secondary | ICD-10-CM

## 2011-05-06 DIAGNOSIS — F39 Unspecified mood [affective] disorder: Secondary | ICD-10-CM

## 2011-05-06 MED ORDER — DIAZEPAM 5 MG PO TABS: 5.0000 mg | ORAL_TABLET | Freq: Four times a day (QID) | ORAL | Status: AC | PRN

## 2011-05-06 MED ORDER — VENLAFAXINE HCL ER 150 MG PO TB24: 150.0000 mg | ORAL_TABLET | Freq: Every day | ORAL | Status: DC

## 2011-05-06 MED ORDER — AMITRIPTYLINE HCL 100 MG PO TABS: ORAL_TABLET | ORAL | Status: DC

## 2011-05-17 ENCOUNTER — Other Ambulatory Visit (HOSPITAL_COMMUNITY): Payer: Self-pay | Admitting: Physician Assistant

## 2011-05-17 DIAGNOSIS — F319 Bipolar disorder, unspecified: Secondary | ICD-10-CM

## 2011-05-17 MED ORDER — ZOLPIDEM TARTRATE 10 MG PO TABS: ORAL_TABLET | ORAL | Status: DC

## 2011-05-24 ENCOUNTER — Other Ambulatory Visit (HOSPITAL_COMMUNITY): Payer: Self-pay | Admitting: Physician Assistant

## 2011-05-24 DIAGNOSIS — F39 Unspecified mood [affective] disorder: Secondary | ICD-10-CM

## 2011-05-24 MED ORDER — AMITRIPTYLINE HCL 100 MG PO TABS: ORAL_TABLET | ORAL | Status: DC

## 2011-05-26 ENCOUNTER — Ambulatory Visit (INDEPENDENT_AMBULATORY_CARE_PROVIDER_SITE_OTHER): Payer: Medicare Other | Admitting: Physician Assistant

## 2011-05-26 DIAGNOSIS — F39 Unspecified mood [affective] disorder: Secondary | ICD-10-CM

## 2011-05-26 MED ORDER — VENLAFAXINE HCL ER 150 MG PO CP24: 300.0000 mg | ORAL_CAPSULE | Freq: Every day | ORAL | Status: DC
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
## 2011-06-04 ENCOUNTER — Other Ambulatory Visit (HOSPITAL_COMMUNITY): Payer: Self-pay | Admitting: Physician Assistant

## 2011-06-04 MED ORDER — DIAZEPAM 5 MG PO TABS: 5.0000 mg | ORAL_TABLET | Freq: Four times a day (QID) | ORAL | Status: AC | PRN

## 2011-06-18 ENCOUNTER — Encounter (HOSPITAL_COMMUNITY): Payer: Self-pay | Admitting: *Deleted

## 2011-06-18 ENCOUNTER — Other Ambulatory Visit (HOSPITAL_COMMUNITY): Payer: Self-pay | Admitting: Psychology

## 2011-06-18 DIAGNOSIS — F319 Bipolar disorder, unspecified: Secondary | ICD-10-CM

## 2011-06-18 MED ORDER — ZOLPIDEM TARTRATE 10 MG PO TABS: ORAL_TABLET | ORAL | Status: DC

## 2011-06-24 ENCOUNTER — Other Ambulatory Visit (HOSPITAL_COMMUNITY): Payer: Self-pay | Admitting: *Deleted

## 2011-06-24 DIAGNOSIS — F39 Unspecified mood [affective] disorder: Secondary | ICD-10-CM

## 2011-06-24 MED ORDER — VENLAFAXINE HCL ER 150 MG PO CP24: 300.0000 mg | ORAL_CAPSULE | Freq: Every day | ORAL | Status: DC

## 2011-06-28 ENCOUNTER — Other Ambulatory Visit (HOSPITAL_COMMUNITY): Payer: Self-pay | Admitting: Physician Assistant

## 2011-07-07 ENCOUNTER — Ambulatory Visit (INDEPENDENT_AMBULATORY_CARE_PROVIDER_SITE_OTHER): Payer: Medicare Other | Admitting: Physician Assistant

## 2011-07-07 DIAGNOSIS — F319 Bipolar disorder, unspecified: Secondary | ICD-10-CM

## 2011-07-07 DIAGNOSIS — F39 Unspecified mood [affective] disorder: Secondary | ICD-10-CM

## 2011-07-07 MED ORDER — ZOLPIDEM TARTRATE 10 MG PO TABS: ORAL_TABLET | ORAL | Status: DC

## 2011-07-07 MED ORDER — LAMOTRIGINE 150 MG PO TABS: 150.0000 mg | ORAL_TABLET | Freq: Every day | ORAL | Status: DC

## 2011-07-07 MED ORDER — DIAZEPAM 5 MG PO TABS: 5.0000 mg | ORAL_TABLET | Freq: Four times a day (QID) | ORAL | Status: DC

## 2011-07-07 MED ORDER — OLANZAPINE 10 MG PO TBDP: ORAL_TABLET | ORAL | Status: DC

## 2011-07-07 NOTE — Progress Notes (Signed)
   [facility] Follow-up Outpatient Visit  STARKEISHA VANWINKLE 12-05-1985  Date: 07/07/2011   Subjective: Kerri presents today with her mother to followup on medications for bipolar disorder. She reports that her mood is somewhat improved and she has more energy and motivation. She endorses that she is more interested in working on her craps as well as going out. She complains that she is having periods of anxiety in which she becomes irritable, and asks to be left alone. She states that these periods of anxiety and hearing ability last for several hours. She states that what can help her it is laughing, and if she can watch something funny it helps. She and her mother also brought up the fact that she is having some problems with her organization of her bedroom, and she has many crafts and other items in her room that spilled out onto the floor and at times into the hallway. She denies any suicidal or homicidal ideation. She denies any auditory or visual hallucinations.  There were no vitals filed for this visit.  Mental Status Examination  Appearance: Well groomed and casually dressed Alert: Yes Attention: good  Cooperative: Yes Eye Contact: Good Speech: Clear and even Psychomotor Activity: Decreased Memory/Concentration: Intact Oriented: person, place, time/date and situation Mood: Depressed, mildly Affect: Flat Thought Processes and Associations: Goal Directed and Logical Fund of Knowledge: Good Thought Content: Normal Insight: Fair Judgement: Good  Diagnosis: Bipolar disorder not otherwise specified  Treatment Plan: Although Sheron requested her Valium the increased, I encouraged her to find ways that she can treat her anxiety and irritability without using medication. She has agreed to try to use, he on the television to improve her mood. We will continue her Effexor at 300 mg daily, Ambien 20 mg at bedtime, Zyprexa Zydis 10 mg 3 times daily, Lamictal 150 mg daily,  Valium 5 mg 4 times daily, and Elavil 300 mg at bedtime. I asked her to return in one month, but she asked to wait 3. We will followup in 3 months per her request.  Raidyn Wassink, PA-C

## 2011-07-23 ENCOUNTER — Other Ambulatory Visit (HOSPITAL_COMMUNITY): Payer: Self-pay | Admitting: Physician Assistant

## 2011-07-28 ENCOUNTER — Other Ambulatory Visit (HOSPITAL_COMMUNITY): Payer: Self-pay

## 2011-07-28 DIAGNOSIS — F39 Unspecified mood [affective] disorder: Secondary | ICD-10-CM

## 2011-07-28 MED ORDER — AMITRIPTYLINE HCL 100 MG PO TABS: ORAL_TABLET | ORAL | Status: DC

## 2011-08-16 ENCOUNTER — Other Ambulatory Visit (HOSPITAL_COMMUNITY): Payer: Self-pay | Admitting: *Deleted

## 2011-08-16 DIAGNOSIS — F39 Unspecified mood [affective] disorder: Secondary | ICD-10-CM

## 2011-08-16 MED ORDER — AMITRIPTYLINE HCL 100 MG PO TABS: ORAL_TABLET | ORAL | Status: DC

## 2011-08-26 ENCOUNTER — Other Ambulatory Visit (HOSPITAL_COMMUNITY): Payer: Self-pay | Admitting: *Deleted

## 2011-08-26 DIAGNOSIS — F39 Unspecified mood [affective] disorder: Secondary | ICD-10-CM

## 2011-08-26 MED ORDER — VENLAFAXINE HCL ER 150 MG PO CP24: 300.0000 mg | ORAL_CAPSULE | Freq: Every day | ORAL | Status: DC

## 2011-09-07 ENCOUNTER — Other Ambulatory Visit (HOSPITAL_COMMUNITY): Payer: Self-pay | Admitting: *Deleted

## 2011-09-07 DIAGNOSIS — F319 Bipolar disorder, unspecified: Secondary | ICD-10-CM

## 2011-09-07 MED ORDER — DIAZEPAM 5 MG PO TABS: 5.0000 mg | ORAL_TABLET | Freq: Four times a day (QID) | ORAL | Status: DC

## 2011-09-07 NOTE — Telephone Encounter (Signed)
EPIC shows-RX of Diazepam 5 mg-take  QID - phoned in on 07/07/11  for 30 day supply with 2 refills. Per pharmacy--RX of Diazepam 5 mg-take  QID - phoned in on 4/3 was recorded as 30 day supply with one refill.   Was filled initially on 07/09/11 and the second time on 08/06/11.

## 2011-09-10 ENCOUNTER — Other Ambulatory Visit (HOSPITAL_COMMUNITY): Payer: Self-pay | Admitting: Psychology

## 2011-09-10 DIAGNOSIS — F319 Bipolar disorder, unspecified: Secondary | ICD-10-CM

## 2011-09-10 DIAGNOSIS — F39 Unspecified mood [affective] disorder: Secondary | ICD-10-CM

## 2011-09-10 MED ORDER — LAMOTRIGINE 150 MG PO TABS: 150.0000 mg | ORAL_TABLET | Freq: Every day | ORAL | Status: DC

## 2011-09-10 MED ORDER — OLANZAPINE 10 MG PO TBDP: ORAL_TABLET | ORAL | Status: DC

## 2011-10-06 ENCOUNTER — Ambulatory Visit (HOSPITAL_COMMUNITY): Payer: No Typology Code available for payment source | Admitting: Physician Assistant

## 2011-10-08 ENCOUNTER — Other Ambulatory Visit (HOSPITAL_COMMUNITY): Payer: Self-pay | Admitting: Psychology

## 2011-10-08 DIAGNOSIS — F319 Bipolar disorder, unspecified: Secondary | ICD-10-CM

## 2011-10-08 MED ORDER — ZOLPIDEM TARTRATE 10 MG PO TABS: ORAL_TABLET | ORAL | Status: DC

## 2011-10-26 ENCOUNTER — Other Ambulatory Visit (HOSPITAL_COMMUNITY): Payer: Self-pay | Admitting: *Deleted

## 2011-10-26 DIAGNOSIS — F39 Unspecified mood [affective] disorder: Secondary | ICD-10-CM

## 2011-10-26 MED ORDER — VENLAFAXINE HCL ER 150 MG PO CP24: 300.0000 mg | ORAL_CAPSULE | Freq: Every day | ORAL | Status: DC

## 2011-11-08 ENCOUNTER — Other Ambulatory Visit (HOSPITAL_COMMUNITY): Payer: Self-pay | Admitting: *Deleted

## 2011-11-08 DIAGNOSIS — F319 Bipolar disorder, unspecified: Secondary | ICD-10-CM

## 2011-11-08 MED ORDER — DIAZEPAM 5 MG PO TABS: 5.0000 mg | ORAL_TABLET | Freq: Four times a day (QID) | ORAL | Status: DC

## 2011-11-08 MED ORDER — ZOLPIDEM TARTRATE 10 MG PO TABS: ORAL_TABLET | ORAL | Status: DC

## 2011-11-08 NOTE — Telephone Encounter (Signed)
Per EPIC, Valium was given 09/07/11 - phoned in- #120 with 2 refills.  Per pharmacy, only 1 refill recorded.  Refill given for 30 days

## 2011-12-01 ENCOUNTER — Telehealth (HOSPITAL_COMMUNITY): Payer: Self-pay

## 2011-12-01 ENCOUNTER — Ambulatory Visit (HOSPITAL_COMMUNITY): Payer: No Typology Code available for payment source | Admitting: Physician Assistant

## 2011-12-01 NOTE — Telephone Encounter (Signed)
8:49AM 12/01/11 PT'S MOTHER CALLED STATING THAT PT'S WAS JUST DISCHARGED FROM THE HOSPITAL DUE TO ILLNESS WITH KIDNEY PROBLEMS - UNABLE TO MAKE APPT - PT'S MOTHER WOULD LIKE FOR THE PROVIDER (ALAN) TO CALL HER./SH

## 2011-12-08 ENCOUNTER — Other Ambulatory Visit (HOSPITAL_COMMUNITY): Payer: Self-pay | Admitting: *Deleted

## 2011-12-08 DIAGNOSIS — F319 Bipolar disorder, unspecified: Secondary | ICD-10-CM

## 2011-12-08 MED ORDER — ZOLPIDEM TARTRATE 10 MG PO TABS: ORAL_TABLET | ORAL | Status: DC

## 2011-12-09 ENCOUNTER — Other Ambulatory Visit (HOSPITAL_COMMUNITY): Payer: Self-pay | Admitting: *Deleted

## 2011-12-09 DIAGNOSIS — F319 Bipolar disorder, unspecified: Secondary | ICD-10-CM

## 2011-12-10 MED ORDER — DIAZEPAM 5 MG PO TABS: 5.0000 mg | ORAL_TABLET | Freq: Four times a day (QID) | ORAL | Status: DC

## 2011-12-10 NOTE — Telephone Encounter (Signed)
Attempted to reach patient at her home telephone number listed in the medical record. No answer, and no opportunity to leave a message. Patient has not been seen since 07/07/2011, and has had multiple missed appointments since. Patient continues to request refills on medications, although she has not been seen in 5 months. Provider needs to discuss with patient need to be seen in office for continued medication prescription. Will authorize one month of requested medication at this time, an attempt will be made to reach patient.

## 2011-12-16 ENCOUNTER — Emergency Department (HOSPITAL_BASED_OUTPATIENT_CLINIC_OR_DEPARTMENT_OTHER): Payer: No Typology Code available for payment source

## 2011-12-16 ENCOUNTER — Inpatient Hospital Stay (HOSPITAL_BASED_OUTPATIENT_CLINIC_OR_DEPARTMENT_OTHER)
Admission: EM | Admit: 2011-12-16 | Discharge: 2011-12-19 | DRG: 644 | Disposition: A | Discharge status: Home / Self Care | Payer: No Typology Code available for payment source | Attending: Internal Medicine | Admitting: Internal Medicine

## 2011-12-16 ENCOUNTER — Encounter (HOSPITAL_BASED_OUTPATIENT_CLINIC_OR_DEPARTMENT_OTHER): Payer: Self-pay | Admitting: Emergency Medicine

## 2011-12-16 DIAGNOSIS — K219 Gastro-esophageal reflux disease without esophagitis: Secondary | ICD-10-CM

## 2011-12-16 DIAGNOSIS — F329 Major depressive disorder, single episode, unspecified: Secondary | ICD-10-CM

## 2011-12-16 DIAGNOSIS — Z8249 Family history of ischemic heart disease and other diseases of the circulatory system: Secondary | ICD-10-CM

## 2011-12-16 DIAGNOSIS — K08409 Partial loss of teeth, unspecified cause, unspecified class: Secondary | ICD-10-CM

## 2011-12-16 DIAGNOSIS — F39 Unspecified mood [affective] disorder: Secondary | ICD-10-CM

## 2011-12-16 DIAGNOSIS — Z882 Allergy status to sulfonamides status: Secondary | ICD-10-CM

## 2011-12-16 DIAGNOSIS — K567 Ileus, unspecified: Secondary | ICD-10-CM

## 2011-12-16 DIAGNOSIS — F319 Bipolar disorder, unspecified: Secondary | ICD-10-CM

## 2011-12-16 DIAGNOSIS — M797 Fibromyalgia: Secondary | ICD-10-CM

## 2011-12-16 DIAGNOSIS — K5900 Constipation, unspecified: Secondary | ICD-10-CM

## 2011-12-16 DIAGNOSIS — IMO0001 Reserved for inherently not codable concepts without codable children: Secondary | ICD-10-CM | POA: Diagnosis present

## 2011-12-16 DIAGNOSIS — Z880 Allergy status to penicillin: Secondary | ICD-10-CM

## 2011-12-16 DIAGNOSIS — E213 Hyperparathyroidism, unspecified: Principal | ICD-10-CM

## 2011-12-16 DIAGNOSIS — G43909 Migraine, unspecified, not intractable, without status migrainosus: Secondary | ICD-10-CM

## 2011-12-16 DIAGNOSIS — E669 Obesity, unspecified: Secondary | ICD-10-CM | POA: Diagnosis present

## 2011-12-16 DIAGNOSIS — F32A Depression, unspecified: Secondary | ICD-10-CM

## 2011-12-16 DIAGNOSIS — N390 Urinary tract infection, site not specified: Secondary | ICD-10-CM | POA: Diagnosis present

## 2011-12-16 DIAGNOSIS — Z833 Family history of diabetes mellitus: Secondary | ICD-10-CM

## 2011-12-16 DIAGNOSIS — E063 Autoimmune thyroiditis: Secondary | ICD-10-CM | POA: Diagnosis present

## 2011-12-16 DIAGNOSIS — Z6836 Body mass index (BMI) 36.0-36.9, adult: Secondary | ICD-10-CM

## 2011-12-16 DIAGNOSIS — N200 Calculus of kidney: Secondary | ICD-10-CM

## 2011-12-16 HISTORY — DX: Hyperparathyroidism, unspecified: E21.3

## 2011-12-16 HISTORY — DX: Major depressive disorder, single episode, unspecified: F32.9

## 2011-12-16 HISTORY — DX: Migraine, unspecified, not intractable, without status migrainosus: G43.909

## 2011-12-16 HISTORY — DX: Bipolar disorder, unspecified: F31.9

## 2011-12-16 HISTORY — DX: Fibromyalgia: M79.7

## 2011-12-16 HISTORY — DX: Ileus, unspecified: K56.7

## 2011-12-16 HISTORY — DX: Partial loss of teeth, unspecified cause, unspecified class: K08.409

## 2011-12-16 HISTORY — DX: Autoimmune thyroiditis: E06.3

## 2011-12-16 HISTORY — DX: Gastro-esophageal reflux disease without esophagitis: K21.9

## 2011-12-16 HISTORY — DX: Depression, unspecified: F32.A

## 2011-12-16 LAB — RAPID URINE DRUG SCREEN, HOSP PERFORMED
Amphetamines: NOT DETECTED
Barbiturates: NOT DETECTED
Benzodiazepines: POSITIVE — AB
Tetrahydrocannabinol: NOT DETECTED

## 2011-12-16 LAB — URINALYSIS, ROUTINE W REFLEX MICROSCOPIC
Glucose, UA: NEGATIVE mg/dL
Hgb urine dipstick: NEGATIVE
Specific Gravity, Urine: 1.018 (ref 1.005–1.030)
pH: 6 (ref 5.0–8.0)

## 2011-12-16 LAB — BASIC METABOLIC PANEL
BUN: 9 mg/dL (ref 6–23)
CO2: 22 mEq/L (ref 19–32)
Chloride: 103 mEq/L (ref 96–112)
Chloride: 103 mEq/L (ref 96–112)
Creatinine, Ser: 0.9 mg/dL (ref 0.50–1.10)
GFR calc Af Amer: >90 mL/min (ref >90)
Potassium: 3.3 mEq/L — ABNORMAL LOW (ref 3.5–5.1)

## 2011-12-16 LAB — HEPATIC FUNCTION PANEL
ALT: 11 U/L (ref 0–35)
AST: 12 U/L (ref 0–37)
Albumin: 4.6 g/dL (ref 3.5–5.2)
Indirect Bilirubin: 0.1 mg/dL — ABNORMAL LOW (ref 0.3–0.9)
Total Protein: 7.6 g/dL (ref 6.0–8.3)

## 2011-12-16 LAB — CBC WITH DIFFERENTIAL/PLATELET
Eosinophils Relative: 1 % (ref 0–5)
HCT: 37.8 % (ref 36.0–46.0)
Lymphocytes Relative: 23 % (ref 12–46)
Lymphs Abs: 1.2 10*3/uL (ref 0.7–4.0)
MCV: 76.8 fL — ABNORMAL LOW (ref 78.0–100.0)
Monocytes Absolute: 0.5 10*3/uL (ref 0.1–1.0)
Neutro Abs: 3.6 10*3/uL (ref 1.7–7.7)
Platelets: 215 10*3/uL (ref 150–400)
RBC: 4.92 MIL/uL (ref 3.87–5.11)
WBC: 5.4 10*3/uL (ref 4.0–10.5)

## 2011-12-16 LAB — GC/CHLAMYDIA PROBE AMP, GENITAL: Chlamydia, DNA Probe: NEGATIVE

## 2011-12-16 LAB — POTASSIUM: Potassium: 3.5 mEq/L (ref 3.5–5.1)

## 2011-12-16 LAB — WET PREP, GENITAL: Clue Cells Wet Prep HPF POC: NONE SEEN

## 2011-12-16 LAB — ACETAMINOPHEN LEVEL: Acetaminophen (Tylenol), Serum: <15 ug/mL (ref 10–30)

## 2011-12-16 LAB — MAGNESIUM: Magnesium: 1.9 mg/dL (ref 1.5–2.5)

## 2011-12-16 MED ORDER — PROMETHAZINE HCL 25 MG PO TABS
25.0000 mg | ORAL_TABLET | Freq: Four times a day (QID) | ORAL | Status: DC | PRN
  Administered 2011-12-17 (×2): 25 mg via ORAL
  Filled 2011-12-16 (×2): qty 1

## 2011-12-16 MED ORDER — POTASSIUM CHLORIDE CRYS ER 20 MEQ PO TBCR
EXTENDED_RELEASE_TABLET | ORAL | Status: AC
  Administered 2011-12-16: 40 meq via ORAL
  Filled 2011-12-16: qty 2

## 2011-12-16 MED ORDER — FUROSEMIDE 10 MG/ML IJ SOLN
20.0000 mg | Freq: Once | INTRAMUSCULAR | Status: AC
  Administered 2011-12-16: 20 mg via INTRAVENOUS

## 2011-12-16 MED ORDER — LAMOTRIGINE 150 MG PO TABS
150.0000 mg | ORAL_TABLET | Freq: Every day | ORAL | Status: DC
  Administered 2011-12-16 – 2011-12-19 (×4): 150 mg via ORAL
  Filled 2011-12-16 (×4): qty 1

## 2011-12-16 MED ORDER — METHOCARBAMOL 500 MG PO TABS
500.0000 mg | ORAL_TABLET | Freq: Three times a day (TID) | ORAL | Status: DC | PRN
  Filled 2011-12-16: qty 1

## 2011-12-16 MED ORDER — ENOXAPARIN SODIUM 40 MG/0.4ML ~~LOC~~ SOLN
40.0000 mg | SUBCUTANEOUS | Status: DC
  Administered 2011-12-16 – 2011-12-19 (×4): 40 mg via SUBCUTANEOUS
  Filled 2011-12-16 (×4): qty 0.4

## 2011-12-16 MED ORDER — FUROSEMIDE 10 MG/ML IJ SOLN
40.0000 mg | Freq: Once | INTRAMUSCULAR | Status: AC
  Administered 2011-12-16: 40 mg via INTRAVENOUS
  Filled 2011-12-16: qty 4

## 2011-12-16 MED ORDER — POTASSIUM CHLORIDE CRYS ER 20 MEQ PO TBCR
40.0000 meq | EXTENDED_RELEASE_TABLET | Freq: Once | ORAL | Status: AC
  Administered 2011-12-16: 40 meq via ORAL

## 2011-12-16 MED ORDER — SODIUM CHLORIDE 0.9 % IV BOLUS (SEPSIS)
1000.0000 mL | Freq: Once | INTRAVENOUS | Status: AC
  Administered 2011-12-16: 1000 mL via INTRAVENOUS

## 2011-12-16 MED ORDER — FUROSEMIDE 10 MG/ML IJ SOLN
INTRAMUSCULAR | Status: AC
  Administered 2011-12-16: 20 mg via INTRAVENOUS
  Filled 2011-12-16: qty 4

## 2011-12-16 MED ORDER — CIPROFLOXACIN HCL 500 MG PO TABS
500.0000 mg | ORAL_TABLET | Freq: Two times a day (BID) | ORAL | Status: DC
  Administered 2011-12-16 – 2011-12-17 (×4): 500 mg via ORAL
  Filled 2011-12-16 (×5): qty 1

## 2011-12-16 MED ORDER — OXYCODONE-ACETAMINOPHEN 5-325 MG PO TABS
1.0000 | ORAL_TABLET | ORAL | Status: DC | PRN
  Administered 2011-12-16 – 2011-12-17 (×4): 2 via ORAL
  Filled 2011-12-16 (×4): qty 2

## 2011-12-16 MED ORDER — ACETAMINOPHEN 325 MG PO TABS
650.0000 mg | ORAL_TABLET | Freq: Once | ORAL | Status: AC
  Administered 2011-12-16: 650 mg via ORAL

## 2011-12-16 MED ORDER — LACTULOSE 10 GM/15ML PO SOLN
20.0000 g | Freq: Three times a day (TID) | ORAL | Status: DC
  Filled 2011-12-16 (×12): qty 30

## 2011-12-16 MED ORDER — ZONISAMIDE 100 MG PO CAPS
300.0000 mg | ORAL_CAPSULE | Freq: Every day | ORAL | Status: DC
  Administered 2011-12-16 – 2011-12-18 (×3): 300 mg via ORAL
  Filled 2011-12-16 (×4): qty 3

## 2011-12-16 MED ORDER — HYDROCODONE-ACETAMINOPHEN 10-325 MG PO TABS
1.0000 | ORAL_TABLET | Freq: Four times a day (QID) | ORAL | Status: DC | PRN
  Administered 2011-12-16 (×3): 1 via ORAL
  Filled 2011-12-16 (×3): qty 1

## 2011-12-16 MED ORDER — VENLAFAXINE HCL ER 150 MG PO CP24
300.0000 mg | ORAL_CAPSULE | Freq: Every day | ORAL | Status: DC
  Administered 2011-12-16 – 2011-12-19 (×4): 300 mg via ORAL
  Filled 2011-12-16 (×4): qty 2

## 2011-12-16 MED ORDER — ONDANSETRON HCL 4 MG PO TABS
4.0000 mg | ORAL_TABLET | Freq: Four times a day (QID) | ORAL | Status: DC | PRN
  Administered 2011-12-16 – 2011-12-18 (×3): 4 mg via ORAL
  Filled 2011-12-16 (×3): qty 1

## 2011-12-16 MED ORDER — ACETAMINOPHEN 650 MG RE SUPP: 650.0000 mg | Freq: Four times a day (QID) | RECTAL | Status: DC | PRN

## 2011-12-16 MED ORDER — SERTRALINE HCL 100 MG PO TABS
200.0000 mg | ORAL_TABLET | Freq: Every day | ORAL | Status: DC
  Administered 2011-12-16 – 2011-12-19 (×4): 200 mg via ORAL
  Filled 2011-12-16 (×4): qty 2

## 2011-12-16 MED ORDER — ZOLPIDEM TARTRATE 5 MG PO TABS
5.0000 mg | ORAL_TABLET | Freq: Every evening | ORAL | Status: DC | PRN
  Administered 2011-12-16 – 2011-12-17 (×2): 5 mg via ORAL
  Filled 2011-12-16 (×2): qty 1

## 2011-12-16 MED ORDER — DIAZEPAM 5 MG PO TABS
5.0000 mg | ORAL_TABLET | Freq: Four times a day (QID) | ORAL | Status: DC | PRN
  Administered 2011-12-17: 5 mg via ORAL
  Filled 2011-12-16: qty 1

## 2011-12-16 MED ORDER — OLANZAPINE 10 MG PO TBDP
10.0000 mg | ORAL_TABLET | Freq: Three times a day (TID) | ORAL | Status: DC | PRN
  Filled 2011-12-16: qty 1

## 2011-12-16 MED ORDER — ONDANSETRON HCL 4 MG/2ML IJ SOLN
4.0000 mg | Freq: Once | INTRAMUSCULAR | Status: AC
  Administered 2011-12-16: 4 mg via INTRAVENOUS

## 2011-12-16 MED ORDER — ONDANSETRON HCL 4 MG/2ML IJ SOLN
4.0000 mg | Freq: Four times a day (QID) | INTRAMUSCULAR | Status: DC | PRN
  Administered 2011-12-17 (×4): 4 mg via INTRAVENOUS
  Filled 2011-12-16 (×4): qty 2

## 2011-12-16 MED ORDER — ACETAMINOPHEN 325 MG PO TABS
650.0000 mg | ORAL_TABLET | Freq: Four times a day (QID) | ORAL | Status: DC | PRN
  Administered 2011-12-16: 650 mg via ORAL
  Filled 2011-12-16: qty 2

## 2011-12-16 MED ORDER — FUROSEMIDE 10 MG/ML IJ SOLN
20.0000 mg | Freq: Once | INTRAMUSCULAR | Status: AC
  Administered 2011-12-16: 20 mg via INTRAVENOUS
  Filled 2011-12-16: qty 2

## 2011-12-16 MED ORDER — SODIUM CHLORIDE 0.9 % IV SOLN
INTRAVENOUS | Status: DC
  Administered 2011-12-16 – 2011-12-19 (×8): via INTRAVENOUS
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
## 2011-12-16 NOTE — ED Provider Notes (Signed)
History     CSN: [id number]  Arrival date & time 12/16/11  0033   First MD Initiated Contact with Patient 12/16/11 0054      No chief complaint on file.   (Consider location/radiation/quality/duration/timing/severity/associated sxs/prior treatment) Patient is a 26 y.o. female presenting with weakness. The history is provided by the patient and a parent.  Weakness The primary symptoms include nausea. Primary symptoms do not include headaches, syncope, loss of consciousness, altered mental status, seizures, dizziness, visual change, paresthesias, focal weakness, loss of sensation, speech change, memory loss, fever or vomiting. The symptoms began more than 1 week ago (more than 2 weeks has been seen at [facility] in the Ed four times and treated for UTI). The symptoms are unchanged. The neurological symptoms are diffuse. Context: none.  Additional symptoms include weakness. Additional symptoms do not include neck stiffness, loss of balance, photophobia, hallucinations, taste disturbance, hyperacusis, hearing loss, tinnitus, vertigo, anxiety or irritability. Associated symptoms comments: Myalgias but has fibromyalgia. Medical issues do not include seizures, alcohol use, diabetes, hypertension or recent surgery. Workup history does not include MRI.  Feels fatigued.  Denies CP, SOB and DOE.  PERC negative and [place] 0.  No leg swelling or pain.  No long car trips or plane trips.  No rashes on the skin.  No f/c/r.   No tick exposure (asked twice).  No travel.    History reviewed. No pertinent past medical history.  No past surgical history on file.  No family history on file.  History  Substance Use Topics  . Smoking status: Not on file  . Smokeless tobacco: Not on file  . Alcohol Use: Not on file    OB History    Grav Para Term Preterm Abortions TAB SAB Ect Mult Living                  Review of Systems  Constitutional: Negative for fever, chills, diaphoresis, irritability and unexpected  weight change.  HENT: Negative for hearing loss, facial swelling, neck stiffness and tinnitus.   Eyes: Negative for photophobia.  Respiratory: Negative for chest tightness and shortness of breath.   Cardiovascular: Negative for chest pain, palpitations, leg swelling and syncope.  Gastrointestinal: Positive for nausea. Negative for vomiting, abdominal pain and anal bleeding.  Skin: Negative for rash.  Neurological: Positive for weakness. Negative for dizziness, vertigo, speech change, focal weakness, seizures, loss of consciousness, syncope, facial asymmetry, speech difficulty, light-headedness, numbness, headaches, paresthesias and loss of balance.  Hematological: Negative for adenopathy.  Psychiatric/Behavioral: Negative for hallucinations, memory loss and altered mental status. The patient is nervous/anxious.   All other systems reviewed and are negative.    Allergies  Review of patient's allergies indicates not on file.  Home Medications   Current Outpatient Rx  Name Route Sig Dispense Refill  . AMITRIPTYLINE HCL 100 MG PO TABS  Take 3 tablets by mouth every night at bedtime 90 tablet 1  . DIAZEPAM 5 MG PO TABS Oral Take 1 tablet (5 mg total) by mouth 4 (four) times daily. 120 tablet 0  . LAMOTRIGINE 150 MG PO TABS Oral Take 1 tablet (150 mg total) by mouth daily. 30 tablet 2  . OLANZAPINE 10 MG PO TBDP  Dissolve 1 (one) tablet by mouth three times daily 90 tablet 2  . VENLAFAXINE HCL ER 150 MG PO CP24 Oral Take 2 capsules (300 mg total) by mouth daily. 60 capsule 1  . ZOLPIDEM TARTRATE 10 MG PO TABS  Two by mouth at bedtime  60 tablet 0    BP 135/76  Pulse 94  Temp 98.7 F (37.1 C) (Oral)  Resp 18  Ht 5\' 3"  (1.6 m)  Wt 225 lb (102.059 kg)  BMI 39.86 kg/m2  SpO2 99%  Physical Exam  Constitutional: She is oriented to person, place, and time. She appears well-developed and well-nourished. No distress.  HENT:  Head: Normocephalic and atraumatic.  Right Ear: Tympanic membrane  is not injected. No hemotympanum.  Left Ear: Tympanic membrane is not injected. No hemotympanum.  Mouth/Throat: Oropharynx is clear and moist.  Eyes: Conjunctivae normal and EOM are normal. Pupils are equal, round, and reactive to light.  Neck: Normal range of motion. Neck supple. No tracheal deviation present. No thyromegaly present.       No LAN of the neck arms posterior knees or groin  Cardiovascular: Normal rate, regular rhythm and intact distal pulses.   Pulmonary/Chest: Effort normal and breath sounds normal. She has no wheezes. She has no rales.  Abdominal: Soft. Bowel sounds are normal. There is no tenderness. There is no rebound and no guarding.  Genitourinary: Vaginal discharge found.  Musculoskeletal: Normal range of motion. She exhibits no edema and no tenderness.  Lymphadenopathy:    She has no cervical adenopathy.  Neurological: She is alert and oriented to person, place, and time.  Skin: Skin is warm and dry. No rash noted. She is not diaphoretic. No pallor.  Psychiatric: Her mood appears anxious.    ED Course  Procedures (including critical care time)  Labs Reviewed  URINALYSIS, ROUTINE W REFLEX MICROSCOPIC - Abnormal; Notable for the following:    APPearance CLOUDY (*)     Bilirubin Urine SMALL (*)     All other components within normal limits  CBC WITH DIFFERENTIAL - Abnormal; Notable for the following:    MCV 76.8 (*)     MCH 24.8 (*)     All other components within normal limits  BASIC METABOLIC PANEL - Abnormal; Notable for the following:    Calcium 12.7 (*)     GFR calc non Af Amer 88 (*)     All other components within normal limits  URINE RAPID DRUG SCREEN (HOSP PERFORMED) - Abnormal; Notable for the following:    Opiates POSITIVE (*)     Benzodiazepines POSITIVE (*)     All other components within normal limits  SALICYLATE LEVEL - Abnormal; Notable for the following:    Salicylate Lvl <2.0 (*)     All other components within normal limits  WET PREP,  GENITAL - Abnormal; Notable for the following:    WBC, Wet Prep HPF POC FEW (*)     All other components within normal limits  PREGNANCY, URINE  MONONUCLEOSIS SCREEN  ACETAMINOPHEN LEVEL  GC/CHLAMYDIA PROBE AMP, GENITAL   Dg Chest 2 View  12/16/2011  *RADIOLOGY REPORT*  Clinical Data: Abdominal pain. Fatigue.  Headache.  CHEST - 2 VIEW  Comparison: 10/21/2004  Findings: The heart size and pulmonary vascularity are normal. The lungs appear clear and expanded without focal air space disease or consolidation. No blunting of the costophrenic angles. Calcified granuloma in the right midlung.  No pneumothorax.  Mediastinal contours appear intact.  The mild anterior wedging of T12 is stable since previous study.  Mild degenerative changes in the thoracic spine.  IMPRESSION: No evidence of active pulmonary disease.   Original Report Authenticated By: Marlon Pel, M.D.      No diagnosis found.   Date: 12/16/2011  Rate: 77  Rhythm: normal sinus rhythm  QRS Axis: normal  Intervals: normal  ST/T Wave abnormalities: normal  Conduction Disutrbances: none  Narrative Interpretation: unremarkable      MDM  Cannot exclude that myalgias and nausea and hypersomnolence are secondary to patient's known but untreated hypercalcemia secondary to hyper parathyroidism will continue IVF and lasix and admit        Shamiya Demeritt K Abcde Oneil-Rasch, MD 12/16/11 [phone]

## 2011-12-17 DIAGNOSIS — G43909 Migraine, unspecified, not intractable, without status migrainosus: Secondary | ICD-10-CM

## 2011-12-17 DIAGNOSIS — F39 Unspecified mood [affective] disorder: Secondary | ICD-10-CM

## 2011-12-17 LAB — BASIC METABOLIC PANEL
BUN: 7 mg/dL (ref 6–23)
BUN: 7 mg/dL (ref 6–23)
Calcium: 11.8 mg/dL — ABNORMAL HIGH (ref 8.4–10.5)
Chloride: 104 mEq/L (ref 96–112)
Creatinine, Ser: 0.88 mg/dL (ref 0.50–1.10)
GFR calc Af Amer: >90 mL/min (ref >90)
GFR calc non Af Amer: >90 mL/min (ref >90)
Glucose, Bld: 85 mg/dL (ref 70–99)
Glucose, Bld: 70 mg/dL (ref 70–99)

## 2011-12-17 LAB — PTH, INTACT AND CALCIUM
Calcium, Total (PTH): 12.3 mg/dL — ABNORMAL HIGH (ref 8.4–10.5)
PTH: 179 pg/mL — ABNORMAL HIGH (ref 14.0–72.0)

## 2011-12-17 LAB — CBC
Hemoglobin: 11.2 g/dL — ABNORMAL LOW (ref 12.0–15.0)
MCH: 24.5 pg — ABNORMAL LOW (ref 26.0–34.0)
MCHC: 31.6 g/dL (ref 30.0–36.0)
Platelets: 206 10*3/uL (ref 150–400)

## 2011-12-17 LAB — T3, FREE: T3, Free: 3 pg/mL (ref 2.3–4.2)

## 2011-12-17 MED ORDER — CINACALCET HCL 30 MG PO TABS
30.0000 mg | ORAL_TABLET | Freq: Two times a day (BID) | ORAL | Status: DC
  Administered 2011-12-17 – 2011-12-19 (×5): 30 mg via ORAL
  Filled 2011-12-17 (×7): qty 1

## 2011-12-17 MED ORDER — FUROSEMIDE 10 MG/ML IJ SOLN
20.0000 mg | Freq: Once | INTRAMUSCULAR | Status: AC
  Administered 2011-12-17: 20 mg via INTRAVENOUS
  Filled 2011-12-17: qty 2

## 2011-12-17 MED ORDER — OXYCODONE HCL 5 MG PO TABS
10.0000 mg | ORAL_TABLET | Freq: Four times a day (QID) | ORAL | Status: DC | PRN
  Administered 2011-12-17 – 2011-12-19 (×7): 10 mg via ORAL
  Filled 2011-12-17 (×7): qty 2

## 2011-12-17 MED ORDER — SODIUM CHLORIDE 0.9 % IV SOLN: 60.0000 mg | Freq: Once | INTRAVENOUS | Status: DC

## 2011-12-17 MED ORDER — OXYCODONE HCL 5 MG PO TABS: 5.0000 mg | ORAL_TABLET | Freq: Four times a day (QID) | ORAL | Status: DC | PRN

## 2011-12-17 MED ORDER — POTASSIUM CHLORIDE CRYS ER 20 MEQ PO TBCR
20.0000 meq | EXTENDED_RELEASE_TABLET | Freq: Once | ORAL | Status: AC
  Administered 2011-12-17: 20 meq via ORAL

## 2011-12-17 MED ORDER — KETOROLAC TROMETHAMINE 30 MG/ML IJ SOLN
30.0000 mg | Freq: Once | INTRAMUSCULAR | Status: AC
  Administered 2011-12-17: 30 mg via INTRAVENOUS
  Filled 2011-12-17: qty 1

## 2011-12-17 MED ORDER — PANTOPRAZOLE SODIUM 40 MG PO TBEC
40.0000 mg | DELAYED_RELEASE_TABLET | Freq: Every day | ORAL | Status: DC
  Administered 2011-12-18 – 2011-12-19 (×2): 40 mg via ORAL
  Filled 2011-12-17 (×2): qty 1

## 2011-12-17 MED ORDER — FUROSEMIDE 10 MG/ML IJ SOLN
20.0000 mg | Freq: Once | INTRAMUSCULAR | Status: AC
  Administered 2011-12-17: 20 mg via INTRAVENOUS

## 2011-12-17 MED ORDER — KETOROLAC TROMETHAMINE 30 MG/ML IJ SOLN
30.0000 mg | Freq: Four times a day (QID) | INTRAMUSCULAR | Status: DC
  Administered 2011-12-18 – 2011-12-19 (×7): 30 mg via INTRAVENOUS
  Filled 2011-12-17 (×12): qty 1

## 2011-12-17 MED ORDER — BUTALBITAL-APAP-CAFFEINE 50-325-40 MG PO TABS
2.0000 | ORAL_TABLET | Freq: Four times a day (QID) | ORAL | Status: DC | PRN
  Administered 2011-12-17: 2 via ORAL
  Filled 2011-12-17: qty 2

## 2011-12-17 NOTE — Discharge Summary (Signed)
Physician Discharge Summary  Ashlee Day ZOX:[id number] DOB: March 10, 1986 DOA: 12/16/2011  PCP: No primary provider on file.  Admit date: 12/16/2011 Discharge date: 12/19/2011  Recommendations for Outpatient Follow-up:  Follow up with primary care doctor within 1 week for migraine headache management. Follow up with Dr. Allena Katz for further evaluation of hyperparathyroidism, Hashimoto thyroiditis.  Discharge Diagnoses:  Active Problems:  Constipation  Fibromyalgia  Depression  Hashimoto's thyroiditis  Hyperparathyroidism, unspecified  Bipolar disorder  GERD (gastroesophageal reflux disease)  Migraines   Discharge Condition: stable, improved  Diet recommendation:  Regular  Wt Readings from Last 3 Encounters:  12/16/11 93.895 kg (207 lb)    History of present illness:   The patient is a 26 year old female with depression, bipolar disorder, fibromyalgia, hashimoto thyroiditis, migraines, constipation, kidney stones. A month ago, she had a tooth infection, treated with antibiotics. She became very fatigued and was sleeping throughout the day. She was seen at [facility] where she was diagnosed with a virus. She has not been drinking or eating well so her mom took her back one week later where she was rediagnosed with a virus. She was then found to have a UTI. She was started on macrobid and then 4 days ago she was called because her culture revealed that she had resistance to macrobid adn she was started on ciprofloxacin. She did not urinate for 48 hours and was not eating or drinking. Her mother stayed up over night two nights ago to make sure that she was drinking. She has been profoundly weak.   Her hypercalcemia was diagnosed incidentally in Feb 2013 when she had an ileus. No further work up was done at the time, but then she developed a kidney stone in June/July and was again found to be hypercalcemic. Her primary care doctor determined she had hyperparathyroidism and she has  been scheduled to see Dr. Allena Katz at [facility].   Hospital Course:   Hypercalcemia: Likely primary hyperparathyroidism as phos is low and calcium and high and PTH is elevated at 179. Vitamin D was wnl at 35. Patient has had significant symptoms of hypercalcemia, including constipation, nausea, aches, and headache. Discussed case with Endocrinologist Dr. Allena Katz who recommended starting sensipar, which was started on 9/13.  [name] was given NS infusion with intermittently dosed lasix 20mg  IV with adequate diuresis after each dose.  Her calcium slowly trended down and her muscle and bone pains also started to improve.  Spoke with the pharmacist who recommended against using bisphosphonate at this time secondary to hypophosphatemia.  Her calcium on the date of discharge is 11.1.  And she should continue hydration with 3 liters of fluids daily as an outpatient.  She should also continue sensipar until she follows up with Dr. Allena Katz.    Hashimoto thyroiditis:  The patient's TSH was just below the lower limit of normal and her fT4 and T3 were within normal limit:  Subclinical hyperthyroidism.  She should follow up with Dr. Allena Katz regarding these results.  TSH 0.342, Free T4 1.22, free T3 3.  Although she initially had a mild headache at admission, her headache evolved and she developed worsening pain with nausea, photophobia and phonophobia similar to previous migraines.  She was started on toradol with GI prophylaxis, Butalbital - APAP - caffeine, phenergan.  She continued her home narcotics for pain but was advised that these are not a good treatment for migraine headache.  The morning of 9/14, she continued to have pain so prednisone was started.  She has allergy to DHE and is on zoloft which is a relative contraindication to triptan.  She also has allergy to reglan and depakote.  She started to feel better by the morning of 9/14 and should continue a Adm[place] course of high dose ibuprofen and  phenergan at home for her headache.  She will use protonix for GI prophylaxis.    Although she stated that she had decreased urine output prior to admission, her BUN and creatinine were within normal limits and she made normal quantities of urine during admission.    Bipolar disorder and depression, currently stable. Denies SI and home medications were continued.    UTI: MDR, s/p cipro course.  No signs of C dif.  Procedures:  CXR on 9/12  Consultations:  None  Discharge Exam: Filed Vitals:   12/19/11 0630  BP: 115/64  Pulse: 61  Temp: 98.6 F (37 C)  Resp: 15   Filed Vitals:   12/18/11 0500 12/18/11 1400 12/18/11 2130 12/19/11 0630  BP: 133/83 125/72 106/72 115/64  Pulse: 90 89 78 61  Temp: 98.2 F (36.8 C) 98 F (36.7 C) 98.5 F (36.9 C) 98.6 F (37 C)  TempSrc:  Oral    Resp: 18 20 18 15   Height:      Weight:      SpO2: 95% 96% 93% 98%    General:  Obese caucasian female, no acute distress  ENT: Nares and oropharynx nonerythematous, no exudate, MMM  Cardiovascular: RRR, no murmurs, rubs, or gallops  Respiratory: CTAB  Abdomen: Normal active BS, soft, nondistended, nontender, no organomegaly  Skin: No rash  Musculoskeletal: Normal tone and bulk  Psychiatric: A&Ox4    Discharge Instructions      Discharge Orders    Future Orders Please Complete By Expires   Diet general      Increase activity slowly      Discharge instructions      Comments:   You were hospitalized with fatigue and headache and were found to have a high calcium level from overactive parathyroid glands (hyperparathyroidism).  You were started on IV fluids and lasix to help you flush out the calcium.  You were also given a medication called sensipar which helps calm your parathyroid glands.  Your calcium on the day of discharge is 11.1.  Please follow up with Dr. Allena Katz on 9/19.  Your TSH (thyroid stimulating hormone) was a little low, but your thyroid hormones were in the normal range.  You  had a migraine which was treated with toradol, narcotics, phenergan, and prednisone.  Fioricet did not work and you were not able to take the other common medications we use to treat migraine.  Please take high dose ibuprofen at home as needed and use protonix to protect your stomach from the ibuprofen.  You may discontinue both when you feel better.  Please drink plenty of fluids, at least 3L a day which is 3-4 of your 32 ounce bottles of water.  Hope you feel better!   Call MD for:  temperature >100.4      Call MD for:  persistant nausea and vomiting      Call MD for:  severe uncontrolled pain      Call MD for:  difficulty breathing, headache or visual disturbances      Call MD for:  hives      Call MD for:  persistant dizziness or light-headedness      Call MD for:  extreme fatigue  Medication List     As of 12/19/2011  9:43 AM    STOP taking these medications         ciprofloxacin 500 MG tablet   Commonly known as: CIPRO      HYDROcodone-acetaminophen 10-325 MG per tablet   Commonly known as: NORCO      naproxen 500 MG tablet   Commonly known as: NAPROSYN      TAKE these medications         cinacalcet 30 MG tablet   Commonly known as: SENSIPAR   Take 1 tablet (30 mg total) by mouth 2 (two) times daily with a meal.      diazepam 5 MG tablet   Commonly known as: VALIUM   Take 5 mg by mouth every 6 (six) hours as needed. Bipolar disorder      ibuprofen 800 MG tablet   Commonly known as: ADVIL,MOTRIN   Take 1 tablet (800 mg total) by mouth every 8 (eight) hours as needed for pain.      lactulose 10 GM/15ML solution   Commonly known as: CHRONULAC   Take 20 g by mouth 3 (three) times daily.      lamoTRIgine 150 MG tablet   Commonly known as: LAMICTAL   Take 1 tablet (150 mg total) by mouth daily.      methocarbamol 500 MG tablet   Commonly known as: ROBAXIN   Take 500 mg by mouth every 8 (eight) hours as needed. Muscle spasms      OLANZapine zydis 10 MG  disintegrating tablet   Commonly known as: ZYPREXA   Take 10 mg by mouth 3 (three) times daily as needed. For periods of high anxiety or mood swings      Oxycodone HCl 10 MG Tabs   Take 1 tablet (10 mg total) by mouth every 6 (six) hours as needed.      pantoprazole 40 MG tablet   Commonly known as: PROTONIX   Take 1 tablet (40 mg total) by mouth daily at 12 noon.      promethazine 25 MG tablet   Commonly known as: PHENERGAN   Take 1 tablet (25 mg total) by mouth every 6 (six) hours as needed for nausea. nausea      sertraline 100 MG tablet   Commonly known as: ZOLOFT   Take 200 mg by mouth daily.      venlafaxine XR 150 MG 24 hr capsule   Commonly known as: EFFEXOR-XR   Take 2 capsules (300 mg total) by mouth daily.      zolpidem 10 MG tablet   Commonly known as: AMBIEN   Two by mouth at bedtime      zonisamide 100 MG capsule   Commonly known as: ZONEGRAN   Take 300 mg by mouth at bedtime.        Follow-up Information    Follow up with PATEL,DHAVAL, MD. On 12/23/2011.   Contact information:   [address] [place] [address] [phone]       Follow up with Primary care doctor. Schedule an appointment as soon as possible for a visit in 1 week. (to discuss migraine management)           The results of significant diagnostics from this hospitalization (including imaging, microbiology, ancillary and laboratory) are listed below for reference.    Significant Diagnostic Studies: Dg Chest 2 View  12/16/2011  *RADIOLOGY REPORT*  Clinical Data: Abdominal pain. Fatigue.  Headache.  CHEST -  2 VIEW  Comparison: 10/21/2004  Findings: The heart size and pulmonary vascularity are normal. The lungs appear clear and expanded without focal air space disease or consolidation. No blunting of the costophrenic angles. Calcified granuloma in the right midlung.  No pneumothorax.  Mediastinal contours appear intact.  The mild anterior wedging of T12 is stable since previous  study.  Mild degenerative changes in the thoracic spine.  IMPRESSION: No evidence of active pulmonary disease.   Original Report Authenticated By: Marlon Pel, M.D.     Microbiology: Recent Results (from the past 240 hour(s))  WET PREP, GENITAL     Status: Abnormal   Collection Time   12/16/11  2:30 AM      Component Value Range Status Comment   Yeast Wet Prep HPF POC NONE SEEN  NONE SEEN Final    Trich, Wet Prep NONE SEEN  NONE SEEN Final    Clue Cells Wet Prep HPF POC NONE SEEN  NONE SEEN Final    WBC, Wet Prep HPF POC FEW (*) NONE SEEN Final      Labs: Basic Metabolic Panel:  Lab 12/19/11 4696 12/18/11 0635 12/17/11 1305 12/17/11 0534 12/16/11 2123 12/16/11 0949  NA 136 138 139 140 -- 138  K 3.7 3.8 3.6 3.8 3.5 --  CL 105 107 104 108 -- 103  CO2 21 21 23 20  -- 22  GLUCOSE 107* 86 70 85 -- 90  BUN 8 6 7 7  -- 7  CREATININE 0.78 0.83 0.88 0.83 -- 0.81  CALCIUM 11.1* 11.2* 12.1* 11.8* -- 12.3*12.7*  MG -- -- -- -- -- 1.9  PHOS 2.8 -- -- -- -- 1.9*   Liver Function Tests:  Lab 12/16/11 0100  AST 12  ALT 11  ALKPHOS 152*  BILITOT 0.2*  PROT 7.6  ALBUMIN 4.6   No results found for this basename: LIPASE:5,AMYLASE:5 in the last 168 hours No results found for this basename: AMMONIA:5 in the last 168 hours CBC:  Lab 12/18/11 0635 12/17/11 0534 12/16/11 0115  WBC 4.1 4.0 5.4  NEUTROABS -- -- 3.6  HGB 11.4* 11.2* 12.2  HCT 36.1 35.4* 37.8  MCV 76.5* 77.5* 76.8*  PLT 194 206 215   Cardiac Enzymes: No results found for this basename: CKTOTAL:5,CKMB:5,CKMBINDEX:5,TROPONINI:5 in the last 168 hours BNP: BNP (last 3 results) No results found for this basename: PROBNP:3 in the last 8760 hours CBG:  Lab 12/19/11 0753 12/18/11 0758  GLUCAP 70 80    Time coordinating discharge: 45 minutes  Signed:  Addiel Mccardle  [facility] 12/19/2011, 9:43 AM

## 2011-12-17 NOTE — Progress Notes (Signed)
[facility] PROGRESS NOTE  Ashlee Day QMV:[id number] DOB: 1985-04-20 DOA: 12/16/2011 PCP: No primary provider on file.  Assessment/Plan: Active Problems:  Constipation  Fibromyalgia  Depression  Hashimoto's thyroiditis  Hyperparathyroidism, unspecified  Bipolar disorder  GERD (gastroesophageal reflux disease)  Migraines  Active Problems:  Constipation   Hypercalcemia: Likely primary hyperparathyroidism as phos is low and calcium and high and PTH is elevated.  Vitamin D wnl. Patient has had significant symptoms of hypercalcemia, including constipation, nausea, aches, and headache. Discussed case with Endocrinologist Dr. Allena Katz who recommended starting sensipar. -  Sensipar 30mg  po BID - NS at 160ml/h  - Serial BMP - Lasix PRN  - Zofran and phenergan prn nausea  - Continue lactulose as needed  -  Spoke with pharmacist who recommended against using bisphosphonate at this time secondary to hypophosphatemia.  Headache, migrainous.  Per patient steroids do not work.  DHEA and  -  Toradol with GI prophylaxis -  Avoid reglan because of rash -  Oral phenergan -  Butalbital - APAP - caffeine -  Avoid tryptans and DHE because patient is on SSRI. -  Oxycodone prn - Tylenol and narcotics as needed for headache   Urine output brisk and creatinine is stable.    Bipolar disorder and depression, currently stable. Denies SI  - Continue home medications   UTI: MDR, s/p cipro - Monitor for c. Diff   DIET:  Regular ACCESS:  PIV PROPH:  lovenox  Code Status: full  Family Communication: updated mom and patient Disposition Plan: pending improvement in calcium and headache.   Brief narrative:  The patient is a 26 year old female with depression, bipolar disorder, fibromyalgia, hashimoto thyroiditis, migraines, constipation, kidney stones. A month ago, she had a tooth infection, treated with antibiotics. She became very fatigued and was sleeping throughout the day. She was seen  at [facility] where she was diagnosed with a virus. She has not been drinking or eating well so her mom took her back one week later where she was rediagnosed with a virus. She was then found to have a UTI. She was started on macrobid and then 4 days ago she was called because her culture revealed that she had resistance to macrobid adn she was started on ciprofloxacin. She did not urinate for 48 hours and was not eating or drinking. Her mother stayed up over night two nights ago to make sure that she was drinking. She has been profoundly weak.  Her hypercalcemia was diagnosed incidentally in Feb 2013 when she had an ileus. No further work up was done at the time, but then she developed a kidney stone in June/July and was again found to be hypercalcemic. Her primary care doctor determined she had hyperparathyroidism and she has been scheduled to see Dr. Allena Katz at [facility].    Consultants:  None  Procedures:  none  Antibiotics:  Ciprofloxacin s/p 3 day course on 9/12  HPI/Subjective:  Increased headache, severe 10/10 with photophobia and phonophobia.  Continues to have muscle and bone pain.  Objective: Filed Vitals:   12/16/11 1500 12/16/11 1600 12/16/11 2047 12/17/11 0500  BP: 138/85 111/76 92/65 113/75  Pulse: 79 73 73 76  Temp: 98 F (36.7 C) 98 F (36.7 C) 97.5 F (36.4 C) 98.3 F (36.8 C)  TempSrc:  Oral Oral Oral  Resp: 18 18 20    Height:      Weight:      SpO2: 98% 96% 95% 97%    Intake/Output Summary (Last  24 hours) at 12/17/11 0852 Last data filed at 12/16/11 1200  Gross per 24 hour  Intake    240 ml  Output      0 ml  Net    240 ml   Filed Weights   12/16/11 0043 12/16/11 0642  Weight: 102.059 kg (225 lb) 93.895 kg (207 lb)    Exam:  General: Obese caucasian female, no acute distress  ENT: Nares and oropharynx nonerythematous, no exudate, MMM  Lymph: No cervcial or supraclavicular lymphadenopathy  Cardiovascular: RRR, no murmurs,  rubs, or gallops  Respiratory: CTAB  Abdomen: Normal active BS, soft, nondistended, nontender, no organomegaly  Skin: No rash  Musculoskeletal: Normal tone and bulk  Psychiatric: A&Ox4    Data Reviewed: Basic Metabolic Panel:  Lab 12/17/11 1610 12/16/11 2123 12/16/11 0949 12/16/11 0115  NA 140 -- 138 138  K 3.8 3.5 3.3* 3.8  CL 108 -- 103 103  CO2 20 -- 22 22  GLUCOSE 85 -- 90 99  BUN 7 -- 7 9  CREATININE 0.83 -- 0.81 0.90  CALCIUM 11.8* -- 12.7* 12.7*  MG -- -- 1.9 --  PHOS -- -- 1.9* --   Liver Function Tests:  Lab 12/16/11 0100  AST 12  ALT 11  ALKPHOS 152*  BILITOT 0.2*  PROT 7.6  ALBUMIN 4.6   No results found for this basename: LIPASE:5,AMYLASE:5 in the last 168 hours No results found for this basename: AMMONIA:5 in the last 168 hours CBC:  Lab 12/17/11 0534 12/16/11 0115  WBC 4.0 5.4  NEUTROABS -- 3.6  HGB 11.2* 12.2  HCT 35.4* 37.8  MCV 77.5* 76.8*  PLT 206 215   Cardiac Enzymes: No results found for this basename: CKTOTAL:5,CKMB:5,CKMBINDEX:5,TROPONINI:5 in the last 168 hours BNP (last 3 results) No results found for this basename: PROBNP:3 in the last 8760 hours CBG: No results found for this basename: GLUCAP:5 in the last 168 hours  Recent Results (from the past 240 hour(s))  WET PREP, GENITAL     Status: Abnormal   Collection Time   12/16/11  2:30 AM      Component Value Range Status Comment   Yeast Wet Prep HPF POC NONE SEEN  NONE SEEN Final    Trich, Wet Prep NONE SEEN  NONE SEEN Final    Clue Cells Wet Prep HPF POC NONE SEEN  NONE SEEN Final    WBC, Wet Prep HPF POC FEW (*) NONE SEEN Final      Studies: Dg Chest 2 View  12/16/2011  *RADIOLOGY REPORT*  Clinical Data: Abdominal pain. Fatigue.  Headache.  CHEST - 2 VIEW  Comparison: 10/21/2004  Findings: The heart size and pulmonary vascularity are normal. The lungs appear clear and expanded without focal air space disease or consolidation. No blunting of the costophrenic angles. Calcified  granuloma in the right midlung.  No pneumothorax.  Mediastinal contours appear intact.  The mild anterior wedging of T12 is stable since previous study.  Mild degenerative changes in the thoracic spine.  IMPRESSION: No evidence of active pulmonary disease.   Original Report Authenticated By: Marlon Pel, M.D.     Scheduled Meds:   . cinacalcet  30 mg Oral BID WC  . ciprofloxacin  500 mg Oral BID  . enoxaparin (LOVENOX) injection  40 mg Subcutaneous Q24H  . furosemide  20 mg Intravenous Once  . furosemide  20 mg Intravenous Once  . furosemide  20 mg Intravenous Once  . lactulose  20 g Oral TID  .  lamoTRIgine  150 mg Oral Daily  . potassium chloride  40 mEq Oral Once  . sertraline  200 mg Oral Daily  . venlafaxine XR  300 mg Oral Daily  . zonisamide  300 mg Oral QHS   Continuous Infusions:   . sodium chloride 150 mL/hr at 12/17/11 4098    Active Problems:  Constipation  Fibromyalgia  Depression  Hashimoto's thyroiditis  Hyperparathyroidism, unspecified  Bipolar disorder  GERD (gastroesophageal reflux disease)  Migraines    Time spent: 30    Ashlee Day, [facility]  [facility] Pager [phone]. If 8PM-8AM, please contact night-coverage at [email or web address], password [facility] 12/17/2011, 8:52 AM  LOS: 1 day

## 2011-12-18 LAB — BASIC METABOLIC PANEL
Calcium: 11.2 mg/dL — ABNORMAL HIGH (ref 8.4–10.5)
Creatinine, Ser: 0.83 mg/dL (ref 0.50–1.10)
GFR calc non Af Amer: >90 mL/min (ref >90)
Sodium: 138 mEq/L (ref 135–145)

## 2011-12-18 LAB — CBC
MCH: 24.2 pg — ABNORMAL LOW (ref 26.0–34.0)
MCV: 76.5 fL — ABNORMAL LOW (ref 78.0–100.0)
Platelets: 194 10*3/uL (ref 150–400)
RBC: 4.72 MIL/uL (ref 3.87–5.11)
RDW: 14.2 % (ref 11.5–15.5)
WBC: 4.1 10*3/uL (ref 4.0–10.5)

## 2011-12-18 LAB — GLUCOSE, CAPILLARY: Glucose-Capillary: 80 mg/dL (ref 70–99)

## 2011-12-18 MED ORDER — PROMETHAZINE HCL 25 MG RE SUPP: 25.0000 mg | Freq: Four times a day (QID) | RECTAL | Status: DC | PRN

## 2011-12-18 MED ORDER — FUROSEMIDE 10 MG/ML IJ SOLN
20.0000 mg | Freq: Once | INTRAMUSCULAR | Status: AC
  Administered 2011-12-18: 20 mg via INTRAVENOUS
  Filled 2011-12-18: qty 2

## 2011-12-18 MED ORDER — PROMETHAZINE HCL 25 MG/ML IJ SOLN
12.5000 mg | Freq: Four times a day (QID) | INTRAMUSCULAR | Status: DC | PRN
  Administered 2011-12-18: 25 mg via INTRAVENOUS
  Filled 2011-12-18 (×2): qty 1

## 2011-12-18 MED ORDER — PREDNISONE 20 MG PO TABS
40.0000 mg | ORAL_TABLET | Freq: Once | ORAL | Status: AC
  Administered 2011-12-18: 40 mg via ORAL
  Filled 2011-12-18: qty 2

## 2011-12-18 MED ORDER — PROMETHAZINE HCL 25 MG PO TABS
25.0000 mg | ORAL_TABLET | Freq: Four times a day (QID) | ORAL | Status: DC | PRN
  Administered 2011-12-18 (×2): 25 mg via ORAL
  Filled 2011-12-18 (×3): qty 1

## 2011-12-18 MED ORDER — PREDNISONE 20 MG PO TABS
40.0000 mg | ORAL_TABLET | Freq: Every day | ORAL | Status: DC
  Administered 2011-12-19: 40 mg via ORAL
  Filled 2011-12-18 (×2): qty 2

## 2011-12-18 NOTE — Progress Notes (Signed)
[facility] PROGRESS NOTE  Ashlee Day LKG:[id number] DOB: 1986-01-06 DOA: 12/16/2011 PCP: No primary provider on file.  Assessment/Plan: Active Problems:  Constipation  Fibromyalgia  Depression  Hashimoto's thyroiditis  Hyperparathyroidism, unspecified  Bipolar disorder  GERD (gastroesophageal reflux disease)  Migraines  Active Problems:  Constipation   Hypercalcemia: Likely primary hyperparathyroidism as phos is low and calcium and high and PTH is elevated.  Vitamin D wnl. Patient has had significant symptoms of hypercalcemia, including constipation, nausea, aches, and headache. Discussed case with Endocrinologist Dr. Allena Katz who recommended starting sensipar.  Calcium slowly trending down, but patient still symptomatic.  Will continue fluids and lasix x 1 more day and if Calcium still decreasing, will discharge to home. -  Sensipar 30mg  po BID -  Continue NS at 163ml/h  -  Serial BMP - Continue Lasix PRN  - Zofran and phenergan prn nausea  - Continue lactulose as needed  -  Spoke with pharmacist who recommended against using bisphosphonate at this time secondary to hypophosphatemia.  Headache, migrainous.  Not improved with toradol and worsened nausea overnight.  She has allergy to DHE and is on zoloft which is a relative contraindication to triptan. She also has allergy to reglan and depakote.  -  Add prednisone 40mg  daily  -  Continue toradol with GI prophylaxis -  Continue oral phenergan -  Minimize narcotics -  Continue Butalbital - APAP - caffeine -  Oxycodone prn  Urine output brisk and creatinine is stable.    Bipolar disorder and depression, currently stable. Denies SI  - Continue home medications   UTI: MDR, s/p cipro - Monitor for c. Diff   DIET:  Regular ACCESS:  PIV PROPH:  lovenox  Code Status: full  Family Communication: updated mom and patient Disposition Plan: pending improvement in calcium and headache.   Brief narrative:  The patient  is a 26 year old female with depression, bipolar disorder, fibromyalgia, hashimoto thyroiditis, migraines, constipation, kidney stones. A month ago, she had a tooth infection, treated with antibiotics. She became very fatigued and was sleeping throughout the day. She was seen at [facility] where she was diagnosed with a virus. She has not been drinking or eating well so her mom took her back one week later where she was rediagnosed with a virus. She was then found to have a UTI. She was started on macrobid and then 4 days ago she was called because her culture revealed that she had resistance to macrobid adn she was started on ciprofloxacin. She did not urinate for 48 hours and was not eating or drinking. Her mother stayed up over night two nights ago to make sure that she was drinking. She has been profoundly weak.  Her hypercalcemia was diagnosed incidentally in Feb 2013 when she had an ileus. No further work up was done at the time, but then she developed a kidney stone in June/July and was again found to be hypercalcemic. Her primary care doctor determined she had hyperparathyroidism and she has been scheduled to see Dr. Allena Katz at [facility].    Consultants:  None  Procedures:  none  Antibiotics:  Ciprofloxacin s/p 3 day course on 9/12  HPI/Subjective:  Stable headache, severe 10/10 with photophobia and phonophobia and worsened nausea overnight.  Continues to have muscle and bone pain.  Objective: Filed Vitals:   12/17/11 1622 12/17/11 2100 12/18/11 0500 12/18/11 1400  BP: 113/78 107/71 133/83 125/72  Pulse: 81 70 90 89  Temp: 97.7 F (36.5 C) 98.2  F (36.8 C) 98.2 F (36.8 C) 98 F (36.7 C)  TempSrc: Oral   Oral  Resp: 20 18 18 20   Height:      Weight:      SpO2: 96% 96% 95% 96%    Intake/Output Summary (Last 24 hours) at 12/18/11 2055 Last data filed at 12/18/11 1800  Gross per 24 hour  Intake   3938 ml  Output   1100 ml  Net   2838 ml   Filed  Weights   12/16/11 0043 12/16/11 0642  Weight: 102.059 kg (225 lb) 93.895 kg (207 lb)    Exam:  General: Obese caucasian female, no acute distress  ENT:  MMM  Cardiovascular: RRR, no murmurs, rubs, or gallops  Respiratory: CTAB  Abdomen: Normal active BS, soft, nondistended, nontender, no organomegaly  Musculoskeletal: Normal tone and bulk  Psychiatric: A&Ox4    Data Reviewed: Basic Metabolic Panel:  Lab 12/18/11 1478 12/17/11 1305 12/17/11 0534 12/16/11 2123 12/16/11 0949 12/16/11 0115  NA 138 139 140 -- 138 138  K 3.8 3.6 3.8 3.5 3.3* --  CL 107 104 108 -- 103 103  CO2 21 23 20  -- 22 22  GLUCOSE 86 70 85 -- 90 99  BUN 6 7 7  -- 7 9  CREATININE 0.83 0.88 0.83 -- 0.81 0.90  CALCIUM 11.2* 12.1* 11.8* -- 12.3*12.7* 12.7*  MG -- -- -- -- 1.9 --  PHOS -- -- -- -- 1.9* --   Liver Function Tests:  Lab 12/16/11 0100  AST 12  ALT 11  ALKPHOS 152*  BILITOT 0.2*  PROT 7.6  ALBUMIN 4.6   No results found for this basename: LIPASE:5,AMYLASE:5 in the last 168 hours No results found for this basename: AMMONIA:5 in the last 168 hours CBC:  Lab 12/18/11 0635 12/17/11 0534 12/16/11 0115  WBC 4.1 4.0 5.4  NEUTROABS -- -- 3.6  HGB 11.4* 11.2* 12.2  HCT 36.1 35.4* 37.8  MCV 76.5* 77.5* 76.8*  PLT 194 206 215   Cardiac Enzymes: No results found for this basename: CKTOTAL:5,CKMB:5,CKMBINDEX:5,TROPONINI:5 in the last 168 hours BNP (last 3 results) No results found for this basename: PROBNP:3 in the last 8760 hours CBG:  Lab 12/18/11 0758  GLUCAP 80    Recent Results (from the past 240 hour(s))  WET PREP, GENITAL     Status: Abnormal   Collection Time   12/16/11  2:30 AM      Component Value Range Status Comment   Yeast Wet Prep HPF POC NONE SEEN  NONE SEEN Final    Trich, Wet Prep NONE SEEN  NONE SEEN Final    Clue Cells Wet Prep HPF POC NONE SEEN  NONE SEEN Final    WBC, Wet Prep HPF POC FEW (*) NONE SEEN Final      Studies: No results found.  Scheduled Meds:      . cinacalcet  30 mg Oral BID WC  . enoxaparin (LOVENOX) injection  40 mg Subcutaneous Q24H  . furosemide  20 mg Intravenous Once  . ketorolac  30 mg Intravenous Q6H  . lactulose  20 g Oral TID  . lamoTRIgine  150 mg Oral Daily  . pantoprazole  40 mg Oral Q1200  . predniSONE  40 mg Oral Once  . sertraline  200 mg Oral Daily  . venlafaxine XR  300 mg Oral Daily  . zonisamide  300 mg Oral QHS  . DISCONTD: ciprofloxacin  500 mg Oral BID   Continuous Infusions:    .  sodium chloride 150 mL/hr at 12/18/11 1713    Active Problems:  Constipation  Fibromyalgia  Depression  Hashimoto's thyroiditis  Hyperparathyroidism, unspecified  Bipolar disorder  GERD (gastroesophageal reflux disease)  Migraines    Time spent: 30    Ashlee Day, [facility]  [facility] Pager [phone]. If 8PM-8AM, please contact night-coverage at [email or web address], password [facility] 12/18/2011, 8:55 PM  LOS: 2 days

## 2011-12-19 DIAGNOSIS — N200 Calculus of kidney: Secondary | ICD-10-CM

## 2011-12-19 DIAGNOSIS — K219 Gastro-esophageal reflux disease without esophagitis: Secondary | ICD-10-CM

## 2011-12-19 LAB — BASIC METABOLIC PANEL
BUN: 8 mg/dL (ref 6–23)
Calcium: 11.1 mg/dL — ABNORMAL HIGH (ref 8.4–10.5)
GFR calc Af Amer: >90 mL/min (ref >90)
GFR calc non Af Amer: >90 mL/min (ref >90)
Glucose, Bld: 107 mg/dL — ABNORMAL HIGH (ref 70–99)
Potassium: 3.7 mEq/L (ref 3.5–5.1)
Sodium: 136 mEq/L (ref 135–145)

## 2011-12-19 LAB — GLUCOSE, CAPILLARY

## 2011-12-19 MED ORDER — OXYCODONE HCL 10 MG PO TABS: 10.0000 mg | ORAL_TABLET | Freq: Four times a day (QID) | ORAL | Status: AC | PRN

## 2011-12-19 MED ORDER — CINACALCET HCL 30 MG PO TABS: 30.0000 mg | ORAL_TABLET | Freq: Two times a day (BID) | ORAL | Status: AC

## 2011-12-19 MED ORDER — IBUPROFEN 800 MG PO TABS: 800.0000 mg | ORAL_TABLET | Freq: Three times a day (TID) | ORAL | Status: AC | PRN

## 2011-12-19 MED ORDER — PROMETHAZINE HCL 25 MG PO TABS: 25.0000 mg | ORAL_TABLET | Freq: Four times a day (QID) | ORAL | Status: DC | PRN

## 2011-12-19 MED ORDER — PANTOPRAZOLE SODIUM 40 MG PO TBEC: 40.0000 mg | DELAYED_RELEASE_TABLET | Freq: Every day | ORAL | Status: DC

## 2011-12-19 NOTE — Progress Notes (Signed)
Pt reports being more weak and lethargic than she has been.  Pt is alert and ambulatory to bathroom.  Pt has been medicated previously with oxy and phenergan as prescribed.  Pt's mother at bedside and agrees.  They request am labs to be drawn.  NP on call advised and new orders to draw labs now received.  Will continue to monitor.

## 2011-12-27 ENCOUNTER — Telehealth (HOSPITAL_COMMUNITY): Payer: Self-pay | Admitting: *Deleted

## 2011-12-27 ENCOUNTER — Telehealth (HOSPITAL_COMMUNITY): Payer: Self-pay | Admitting: Physician Assistant

## 2011-12-27 NOTE — Telephone Encounter (Signed)
Patient's mother called to update provider on patient's current medical condition. Patient has been diagnosed with hyperparathyroid problem. Was hospitalized and do to hypercalcemia, and has subsequently had surgery to correct her hyperparathyroid. Has had complications from surgery with calcium dropping too low. Patient's doctor, Dr. Orvan Falconer, may be contacting this provider to discuss possible medication changes.

## 2011-12-27 NOTE — Telephone Encounter (Signed)
Mother called 9/20  @ 1353.Left VM. Received 9/23 @ 0907. Mother wanted to update provider, Jorje Guild PA,  on Ashlee Day: Ashlee Day missed last appt on 12/01/11 due to patient being recently discharged from [facility] at that time. Was discharged Sunday 12/19/11 from [facility], admitted for increased Calcium levels, placed on cardiac unit to be monitored Now inpatient at [facility] for increased Calcium, with complications. May need surgery.

## 2012-01-03 ENCOUNTER — Telehealth (HOSPITAL_COMMUNITY): Payer: Self-pay | Admitting: *Deleted

## 2012-01-03 DIAGNOSIS — F39 Unspecified mood [affective] disorder: Secondary | ICD-10-CM

## 2012-01-03 MED ORDER — VENLAFAXINE HCL ER 150 MG PO CP24: 300.0000 mg | ORAL_CAPSULE | Freq: Every day | ORAL | Status: DC

## 2012-01-05 ENCOUNTER — Other Ambulatory Visit (HOSPITAL_COMMUNITY): Payer: Self-pay | Admitting: *Deleted

## 2012-01-05 DIAGNOSIS — F319 Bipolar disorder, unspecified: Secondary | ICD-10-CM

## 2012-01-06 ENCOUNTER — Telehealth (HOSPITAL_COMMUNITY): Payer: Self-pay | Admitting: *Deleted

## 2012-01-06 MED ORDER — ZOLPIDEM TARTRATE 10 MG PO TABS: ORAL_TABLET | ORAL | Status: DC

## 2012-01-06 NOTE — Telephone Encounter (Signed)
error 

## 2012-01-06 NOTE — Telephone Encounter (Signed)
Patient last appt 4/3. Requested front office staff contact pt for follow up appt.

## 2012-01-27 ENCOUNTER — Other Ambulatory Visit (HOSPITAL_COMMUNITY): Payer: Self-pay | Admitting: *Deleted

## 2012-01-27 DIAGNOSIS — F319 Bipolar disorder, unspecified: Secondary | ICD-10-CM

## 2012-01-27 MED ORDER — LAMOTRIGINE 150 MG PO TABS: 150.0000 mg | ORAL_TABLET | Freq: Every day | ORAL | Status: DC

## 2012-02-01 ENCOUNTER — Other Ambulatory Visit (HOSPITAL_COMMUNITY): Payer: Self-pay | Admitting: *Deleted

## 2012-02-01 DIAGNOSIS — F39 Unspecified mood [affective] disorder: Secondary | ICD-10-CM

## 2012-02-01 MED ORDER — VENLAFAXINE HCL ER 150 MG PO CP24: 300.0000 mg | ORAL_CAPSULE | Freq: Every day | ORAL | Status: DC

## 2012-02-08 ENCOUNTER — Other Ambulatory Visit (HOSPITAL_COMMUNITY): Payer: Self-pay | Admitting: *Deleted

## 2012-02-08 DIAGNOSIS — F319 Bipolar disorder, unspecified: Secondary | ICD-10-CM

## 2012-02-08 MED ORDER — ZOLPIDEM TARTRATE 10 MG PO TABS: ORAL_TABLET | ORAL | Status: DC

## 2012-02-23 ENCOUNTER — Ambulatory Visit (INDEPENDENT_AMBULATORY_CARE_PROVIDER_SITE_OTHER): Payer: Medicare Other | Admitting: Physician Assistant

## 2012-02-23 DIAGNOSIS — F39 Unspecified mood [affective] disorder: Secondary | ICD-10-CM | POA: Insufficient documentation

## 2012-02-23 MED ORDER — LORAZEPAM 1 MG PO TABS: 1.0000 mg | ORAL_TABLET | Freq: Four times a day (QID) | ORAL | Status: DC | PRN

## 2012-02-23 MED ORDER — VENLAFAXINE HCL ER 150 MG PO CP24: 300.0000 mg | ORAL_CAPSULE | Freq: Every day | ORAL | Status: DC

## 2012-02-23 NOTE — Progress Notes (Signed)
   [facility] Follow-up Outpatient Visit  Ashlee Day 02-14-1986  Date: 02/23/2012   Subjective: Ashlee Day presents today with her mother to followup on her treatment for depression and anxiety. She was last seen in June of 2013. She has been going through multiple medical problems, and had surgery for a parathyroid problem. This surgery seems to have resolved her aggressive behavior. She also has lost 60 pounds, and feels a lot better. She feels that she can think more clearly now. She has stopped taking the Zyprexa as she felt it did not help and she no longer needed. She feels that the Valium has not been very effective, and reports that while in the hospital she received Ativan, which she would like to try on an outpatient basis instead of the Valium. She reports that recently she has been depressed and anxious. Her sleep is okay, but she complains of having nightmares. Her appetite is poor, and she does not always he has she should. She denies any suicidal or homicidal ideation. She denies any auditory or visual hallucinations.  There were no vitals filed for this visit.  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 We will continue her alum it to 150 mg daily, Zoloft 200 mg daily, Effexor XR 300 mg daily, and Ambien 20 mg at bedtime. She will return for followup in 2 months at my next available  appointment.  Jeff Frieden, PA-C

## 2012-03-06 ENCOUNTER — Other Ambulatory Visit (HOSPITAL_COMMUNITY): Payer: Self-pay | Admitting: *Deleted

## 2012-03-06 DIAGNOSIS — F319 Bipolar disorder, unspecified: Secondary | ICD-10-CM

## 2012-03-06 MED ORDER — ZOLPIDEM TARTRATE 10 MG PO TABS: ORAL_TABLET | ORAL | Status: DC

## 2012-03-31 ENCOUNTER — Telehealth (HOSPITAL_COMMUNITY): Payer: Self-pay

## 2012-03-31 DIAGNOSIS — F319 Bipolar disorder, unspecified: Secondary | ICD-10-CM

## 2012-03-31 MED ORDER — LAMOTRIGINE 150 MG PO TABS: 150.0000 mg | ORAL_TABLET | Freq: Every day | ORAL | Status: DC

## 2012-03-31 NOTE — Telephone Encounter (Signed)
Refill authorization for Lamictal sent to pharmacy

## 2012-04-10 ENCOUNTER — Other Ambulatory Visit (HOSPITAL_COMMUNITY): Payer: Self-pay | Admitting: *Deleted

## 2012-04-10 DIAGNOSIS — F319 Bipolar disorder, unspecified: Secondary | ICD-10-CM

## 2012-04-10 MED ORDER — ZOLPIDEM TARTRATE 10 MG PO TABS: ORAL_TABLET | ORAL | Status: DC

## 2012-05-03 ENCOUNTER — Ambulatory Visit (HOSPITAL_COMMUNITY): Payer: Self-pay | Admitting: Physician Assistant

## 2012-05-10 ENCOUNTER — Telehealth (HOSPITAL_COMMUNITY): Payer: Self-pay

## 2012-05-15 ENCOUNTER — Telehealth (HOSPITAL_COMMUNITY): Payer: Self-pay

## 2012-05-16 ENCOUNTER — Telehealth (HOSPITAL_COMMUNITY): Payer: Self-pay

## 2012-05-17 ENCOUNTER — Ambulatory Visit (HOSPITAL_COMMUNITY): Payer: PRIVATE HEALTH INSURANCE | Admitting: Physician Assistant

## 2012-05-19 ENCOUNTER — Telehealth (HOSPITAL_COMMUNITY): Payer: Self-pay

## 2012-05-19 ENCOUNTER — Ambulatory Visit (HOSPITAL_COMMUNITY)
Admission: RE | Admit: 2012-05-19 | Discharge: 2012-05-19 | Disposition: A | Discharge status: Home / Self Care | Payer: No Typology Code available for payment source | Attending: Psychiatry | Admitting: Psychiatry

## 2012-05-19 ENCOUNTER — Encounter (HOSPITAL_COMMUNITY): Payer: Self-pay | Admitting: Emergency Medicine

## 2012-05-19 ENCOUNTER — Emergency Department (HOSPITAL_COMMUNITY)
Admission: EM | Admit: 2012-05-19 | Discharge: 2012-05-22 | Disposition: A | Discharge status: Other Acute Inpatient Hospital | Payer: No Typology Code available for payment source | Attending: Emergency Medicine | Admitting: Emergency Medicine

## 2012-05-19 ENCOUNTER — Encounter (HOSPITAL_COMMUNITY): Payer: Self-pay | Admitting: *Deleted

## 2012-05-19 DIAGNOSIS — F319 Bipolar disorder, unspecified: Secondary | ICD-10-CM | POA: Diagnosis not present

## 2012-05-19 DIAGNOSIS — Z8719 Personal history of other diseases of the digestive system: Secondary | ICD-10-CM | POA: Diagnosis not present

## 2012-05-19 DIAGNOSIS — R443 Hallucinations, unspecified: Secondary | ICD-10-CM | POA: Insufficient documentation

## 2012-05-19 DIAGNOSIS — Z79899 Other long term (current) drug therapy: Secondary | ICD-10-CM | POA: Diagnosis not present

## 2012-05-19 DIAGNOSIS — E3121 Multiple endocrine neoplasia [MEN] type I: Secondary | ICD-10-CM | POA: Insufficient documentation

## 2012-05-19 DIAGNOSIS — Z862 Personal history of diseases of the blood and blood-forming organs and certain disorders involving the immune mechanism: Secondary | ICD-10-CM | POA: Insufficient documentation

## 2012-05-19 DIAGNOSIS — Z87442 Personal history of urinary calculi: Secondary | ICD-10-CM | POA: Insufficient documentation

## 2012-05-19 DIAGNOSIS — Z008 Encounter for other general examination: Secondary | ICD-10-CM | POA: Diagnosis present

## 2012-05-19 DIAGNOSIS — Z8639 Personal history of other endocrine, nutritional and metabolic disease: Secondary | ICD-10-CM | POA: Insufficient documentation

## 2012-05-19 DIAGNOSIS — F411 Generalized anxiety disorder: Secondary | ICD-10-CM | POA: Diagnosis not present

## 2012-05-19 DIAGNOSIS — IMO0001 Reserved for inherently not codable concepts without codable children: Secondary | ICD-10-CM | POA: Insufficient documentation

## 2012-05-19 DIAGNOSIS — Z8739 Personal history of other diseases of the musculoskeletal system and connective tissue: Secondary | ICD-10-CM | POA: Insufficient documentation

## 2012-05-19 DIAGNOSIS — G43909 Migraine, unspecified, not intractable, without status migrainosus: Secondary | ICD-10-CM | POA: Insufficient documentation

## 2012-05-19 DIAGNOSIS — F419 Anxiety disorder, unspecified: Secondary | ICD-10-CM

## 2012-05-19 HISTORY — DX: Torticollis: M43.6

## 2012-05-19 LAB — COMPREHENSIVE METABOLIC PANEL
ALT: 14 U/L (ref 0–35)
AST: 10 U/L (ref 0–37)
Alkaline Phosphatase: 82 U/L (ref 39–117)
CO2: 20 mEq/L (ref 19–32)
Chloride: 100 mEq/L (ref 96–112)
GFR calc non Af Amer: >90 mL/min (ref >90)
Potassium: 3.6 mEq/L (ref 3.5–5.1)
Sodium: 134 mEq/L — ABNORMAL LOW (ref 135–145)
Total Bilirubin: 0.1 mg/dL — ABNORMAL LOW (ref 0.3–1.2)

## 2012-05-19 LAB — RAPID URINE DRUG SCREEN, HOSP PERFORMED
Amphetamines: NOT DETECTED
Barbiturates: NOT DETECTED
Tetrahydrocannabinol: NOT DETECTED

## 2012-05-19 LAB — T4: T4, Total: 8.9 ug/dL (ref 5.0–12.5)

## 2012-05-19 LAB — CBC
MCV: 79.3 fL (ref 78.0–100.0)
Platelets: 295 10*3/uL (ref 150–400)
RBC: 4.97 MIL/uL (ref 3.87–5.11)
WBC: 6.2 10*3/uL (ref 4.0–10.5)

## 2012-05-19 LAB — TSH: TSH: 0.993 u[IU]/mL (ref 0.350–4.500)

## 2012-05-19 MED ORDER — IBUPROFEN 600 MG PO TABS
600.0000 mg | ORAL_TABLET | Freq: Three times a day (TID) | ORAL | Status: DC | PRN
  Administered 2012-05-20 – 2012-05-22 (×2): 600 mg via ORAL
  Filled 2012-05-19 (×2): qty 1

## 2012-05-19 MED ORDER — HALOPERIDOL 5 MG PO TABS
5.0000 mg | ORAL_TABLET | Freq: Two times a day (BID) | ORAL | Status: DC
  Administered 2012-05-19 – 2012-05-20 (×2): 5 mg via ORAL
  Filled 2012-05-19 (×2): qty 1

## 2012-05-19 MED ORDER — OXYCODONE-ACETAMINOPHEN 5-325 MG PO TABS
1.0000 | ORAL_TABLET | Freq: Once | ORAL | Status: AC
  Administered 2012-05-19: 1 via ORAL
  Filled 2012-05-19: qty 1

## 2012-05-19 MED ORDER — LORAZEPAM 1 MG PO TABS
1.0000 mg | ORAL_TABLET | Freq: Three times a day (TID) | ORAL | Status: DC | PRN
  Administered 2012-05-20 – 2012-05-22 (×5): 1 mg via ORAL
  Filled 2012-05-19 (×5): qty 1

## 2012-05-19 MED ORDER — ONDANSETRON HCL 4 MG PO TABS: 4.0000 mg | ORAL_TABLET | Freq: Three times a day (TID) | ORAL | Status: DC | PRN

## 2012-05-19 MED ORDER — ZOLPIDEM TARTRATE 5 MG PO TABS
5.0000 mg | ORAL_TABLET | Freq: Every evening | ORAL | Status: DC | PRN
  Administered 2012-05-19 – 2012-05-21 (×3): 5 mg via ORAL
  Filled 2012-05-19 (×3): qty 1

## 2012-05-19 MED ORDER — LORAZEPAM 1 MG PO TABS
1.0000 mg | ORAL_TABLET | Freq: Once | ORAL | Status: AC
  Administered 2012-05-19: 1 mg via ORAL
  Filled 2012-05-19: qty 1

## 2012-05-19 NOTE — BH Assessment (Signed)
Assessment Note   Ashlee Day is an 27 y.o. female. Walk in accompanied by Mother for an assessment for admission. Mother had called earlier today after speaking to Ashlee Guild PA in our outpatient dept and he referred her here for admission. She reports she has been more agitated than usual and she is having auditory hallucinations and is not sleeping. She has an appt with an outpatient provider but not until the 26th and Mom didn't think she could wait that long for an eval and intervention. She has multiple health problems possibly contributing to her mental health concerns. She had parathyroid surgery the end of Sept and she has Hashimotos disease.They discovered her calcium was elevated and she almost went in to heart failure.She was having very long and heavy menses and she recently had a D&C laproscope and hystoscope on 02/2012.She has not had a period since Nov.She has a pituitary lesion daily migraines and chronic back pain for which she takes Lorocet without much relief.She is pleasant and cooperative. Her Mother is knowledgeable and supportive re her history and need for treatment. [name] endorses visual hallucinations of various types of spiders and bees that she sees all the time. She also reports hearing voices in the room but no one there.She has both nightmares and day terrors and all of these things cause her to feel anxious and restless. When I first encountered her for the assessment she had her thumb tucked under her left breast and offerred she was doing so to better be directed by God. She could communicate better with him as he guided her through our conversation with her thumb in this position.She sees what she refers to [employer]" and her day terrors are of children being hurt and this is most upsetting to her. She has had a 60 lb wt loss since Sept probably related to her para thyroid surgery and the treatment of her pituitary gland.She is less social than previously and is  sleeping only two hours a night, and falls asleep when exhausted. She is fearful of her nightmares and dreads going to sleep. She was hospitalized at [facility] in Jan 4th to Feb 7th. And mom feels they put her medications up and down and it has not been helpful. Ashlee Day while endorses a/v hallucinations also states the haldol has been helpful with the voices and states she is medication compliant. She recently believed that a man she had been interested in was waiting for her at the local [employer] and she flagged down a neighbor to take her there and the neighbor called the family and they told him no one was waiting on her and he returned her to the home.All of this behavior is different than her baseline. She has been different since sept. She has had multiple medication changes and they are involved in medical services to cont tx for her medical problems but her psychotic sx persist. Dr. Carmin Richmond is her medical Dr. And Ashlee Day is treating her mental illness here at [facility]. Cooperative during interview but restless and paced much of interview.States she punched a wall today in frustration over hearing voices but states she does not want to hurt anyone and has no history of hurting people. Denies thoughts to hurt self. She is not using substances. States the only thing she can do to help her through the day with all the psychotic sx is to "pray". Discussed her presentation with [facility] and Shuvon NP and feel she needs a 400 hall bed and  there is not one available so sent to [facility] for medical clearance and holding until a bed is available for her. Mom transported to ED both were agreeable to this plan and to be admitted when bed becomes available.Notified the ED nurse. At [facility] of her transfer.  Axis I: Psychotic Disorder NOS Axis II: Deferred Axis III:  Past Medical History  Diagnosis Date  . Fibromyalgia   . Depression   . Ileus     x3-4  . Hashimoto's thyroiditis     2005ish  . Hyperparathyroidism, unspecified    . Kidney stone     2013  . Bipolar disorder   . GERD (gastroesophageal reflux disease)   . Migraines     occasional aura  . Partial edentulism    Axis IV: other psychosocial or environmental problems Axis V: 21-30 behavior considerably influenced by delusions or hallucinations OR serious impairment in judgment, communication OR inability to function in almost all areas  Past Medical History:  Past Medical History  Diagnosis Date  . Fibromyalgia   . Depression   . Ileus     x3-4  . Hashimoto's thyroiditis     2005ish  . Hyperparathyroidism, unspecified   . Kidney stone     2013  . Bipolar disorder   . GERD (gastroesophageal reflux disease)   . Migraines     occasional aura  . Partial edentulism     Past Surgical History  Procedure Laterality Date  . Appendicitis      2003  . Dilation and curettage of uterus      1999    Family History:  Family History  Problem Relation Age of Onset  . Hypertension Father   . Coronary artery disease Paternal Grandfather     smoker  . Cancer Maternal Grandfather     MM  . Diabetes Paternal Grandfather     Medication induced  . Hypertension Maternal Grandmother   . Migraines Mother   . Rheum arthritis Mother     undifferentiated connective tissue    Social History:  reports that she has never smoked. She does not have any smokeless tobacco history on file. She reports that she does not drink alcohol or use illicit drugs.  Additional Social History:  Alcohol / Drug Use Pain Medications: not abusing Prescriptions: not abusing Over the Counter: not abusing History of alcohol / drug use?: No history of alcohol / drug abuse  CIWA:   COWS:    Allergies:  Allergies  Allergen Reactions  . Amoxicillin Other (See Comments)    Blisters all over Cannot take any 'cillins' at all  . Demerol (Meperidine) Nausea And Vomiting    Can not tolerate Iv administration Can tolerate IM adminstration  . Imitrex (Sumatriptan)      Severity unknown  . Penicillins Other (See Comments)    Cannot not tolerate any cillins due to amoxicillin reaction   . Sulfa Antibiotics Nausea And Vomiting  . Depacon (Valproate Sodium) Nausea And Vomiting and Rash  . Depakote (Divalproex Sodium) Nausea And Vomiting and Rash  . Dihydroergotamine Nausea And Vomiting and Rash  . Metoclopramide Nausea And Vomiting and Rash    React to IV form Can tolerate oral medication  . Morphine And Related Rash    Home Medications:  (Not in a hospital admission)  OB/GYN Status:  No LMP recorded. Patient has had an injection.  General Assessment Data Location of Assessment: [facility] Assessment Services Living Arrangements: Parent Can pt return to current living arrangement?: Yes  Admission Status: Voluntary Is patient capable of signing voluntary admission?: Yes Transfer from: Home Referral Source: Psychiatrist  Education Status Is patient currently in school?: No  Risk to self Suicidal Ideation: No Suicidal Intent: No Is patient at risk for suicide?: No Suicidal Plan?: No Access to Means: No What has been your use of drugs/alcohol within the last 12 months?: none Previous Attempts/Gestures: No How many times?: 0 Other Self Harm Risks: none Triggers for Past Attempts: Unknown Intentional Self Injurious Behavior: None Family Suicide History: No Recent stressful life event(s): Loss (Comment) (lost all but three of her teeth, recent surgury and proceedu) Persecutory voices/beliefs?: Yes Depression: Yes Depression Symptoms: Insomnia;Fatigue;Isolating;Loss of interest in usual pleasures;Feeling angry/irritable Substance abuse history and/or treatment for substance abuse?: No Suicide prevention information given to non-admitted patients: Not applicable  Risk to Others Homicidal Ideation: No Thoughts of Harm to Others: No Current Homicidal Intent: No Current Homicidal Plan: No Access to Homicidal Means: No History of harm to others?:  No Assessment of Violence: None Noted Does patient have access to weapons?: No Criminal Charges Pending?: No Does patient have a court date: No  Psychosis Hallucinations: Auditory;Visual Delusions: Unspecified (thought a man was waiting on her at [facility])  Mental Status Report Appear/Hygiene: Disheveled (wearing pajamas) Eye Contact: Good Motor Activity: Restlessness Speech: Logical/coherent Level of Consciousness: Alert Mood: Depressed;Sad;Preoccupied Affect: Blunted Anxiety Level: Moderate Thought Processes: Coherent;Relevant Judgement: Impaired Orientation: Person;Place;Time;Situation Obsessive Compulsive Thoughts/Behaviors: None  Cognitive Functioning Concentration: Decreased Memory: Recent Intact;Remote Intact IQ: Average Insight: Fair Impulse Control: Fair Appetite: Fair Weight Loss: 60 (since 12/2011 and had recent dx of pituitary lision, and thry) Weight Gain: 0 Sleep: Decreased Total Hours of Sleep: 2 Vegetative Symptoms: None  ADLScreening [facility] Assessment Services) Patient's cognitive ability adequate to safely complete daily activities?: Yes Patient able to express need for assistance with ADLs?: Yes Independently performs ADLs?: Yes (appropriate for developmental age)  Abuse/Neglect [facility]) Physical Abuse: Denies Verbal Abuse: Denies Sexual Abuse: Denies  Prior Inpatient Therapy Prior Inpatient Therapy: Yes Prior Therapy Dates: 05/2012 Prior Therapy Facilty/Provider(s): [facility] Reason for Treatment: psychosis  Prior Outpatient Therapy Prior Outpatient Therapy: Yes Prior Therapy Dates: current  Prior Therapy Facilty/Provider(s): [facility] outpatient Reason for Treatment: psychosis and anxiety  ADL Screening (condition at time of admission) Patient's cognitive ability adequate to safely complete daily activities?: Yes Patient able to express need for assistance with ADLs?: Yes Independently performs ADLs?: Yes (appropriate for developmental age) Weakness  of Legs: None Weakness of Arms/Hands: None  Home Assistive Devices/Equipment Home Assistive Devices/Equipment: None    Abuse/Neglect Assessment (Assessment to be complete while patient is alone) Physical Abuse: Denies Verbal Abuse: Denies Sexual Abuse: Denies Exploitation of patient/patient's resources: Denies Self-Neglect: Denies Values / Beliefs Cultural Requests During Hospitalization: None Spiritual Requests During Hospitalization: None   Advance Directives (For Healthcare) Advance Directive: Patient does not have advance directive Nutrition Screen- [facility] Adult/WL/AP Have you recently lost weight without trying?: Yes If yes, how much weight have you lost?: 34 lb or more Have you been eating poorly because of a decreased appetite?: No Malnutrition Screening Tool Score: 4  Additional Information 1:1 In Past 12 Months?: No CIRT Risk: No Elopement Risk: No Does patient have medical clearance?: No (being sent to [facility] for medical clearance and holding)     Disposition:  Disposition Disposition of Patient: Referred to [facility] for medical clearance and admit when 400 hall avail) Patient referred to: Other (Comment) ([facility] medical clearance)  On Site Evaluation by:   Reviewed with Physician:  Wynona Luna 05/19/2012 6:21 PM

## 2012-05-19 NOTE — ED Provider Notes (Addendum)
History     CSN: [id number]  Arrival date & time 05/19/12  4098   First MD Initiated Contact with Patient 05/19/12 1919      Chief Complaint  Patient presents with  . Medical Clearance    (Consider location/radiation/quality/duration/timing/severity/associated sxs/prior treatment) HPI Pt presents with c/o hearing voices- she states the voices tell her negative things about herself.  She denies having current SI/HI. She also c/o anxiety.  She and her mother who is accompanying her state that she was just discharged approx 1 week ago from [facility] behavioral health.  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  Mom states that since discharge she has been acting more anxious and responding to more hallucinations.  She has not had problems with vomiting, has had normal appetite.  No fever/chills.  She states she is taking her medications as prescribed. There are no other associated systemic symptoms, there are no other alleviating or modifying factors.   Past Medical History  Diagnosis Date  . Fibromyalgia   . Depression   . Ileus     x3-4  . Hashimoto's thyroiditis     2005ish  . Hyperparathyroidism, unspecified   . Kidney stone     2013  . Bipolar disorder   . GERD (gastroesophageal reflux disease)   . Migraines     occasional aura  . Partial edentulism   . Torticollis     Past Surgical History  Procedure Laterality Date  . Appendicitis      2003  . Dilation and curettage of uterus      1999  . Parathyroidectomy      Family History  Problem Relation Age of Onset  . Hypertension Father   . Coronary artery disease Paternal Grandfather     smoker  . Cancer Maternal Grandfather     MM  . Diabetes Paternal Grandfather     Medication induced  . Hypertension Maternal Grandmother   . Migraines Mother   . Rheum arthritis Mother     undifferentiated connective  tissue    History  Substance Use Topics  . Smoking status: Never Smoker   . Smokeless tobacco: Not on file  . Alcohol Use: No    OB History   Grav Para Term Preterm Abortions TAB SAB Ect Mult Living                  Review of Systems ROS reviewed and all otherwise negative except for mentioned in HPI Allergies  Amoxicillin; Demerol; Imitrex; Penicillins; Sulfa antibiotics; Depacon; Depakote; Dihydroergotamine; Metoclopramide; and Morphine and related  Home Medications   Current Outpatient Rx  Name  Route  Sig  Dispense  Refill  . benztropine (COGENTIN) 0.5 MG tablet   Oral   Take 0.5 mg by mouth 2 (two) times daily.         . calcium carbonate (OS-CAL) 600 MG TABS   Oral   Take 600 mg by mouth daily.         . cholecalciferol (VITAMIN D) 1000 UNITS tablet   Oral   Take 1,000 Units by mouth daily.         . haloperidol (HALDOL) 5 MG tablet   Oral   Take 5-10 mg by mouth 3 (three) times daily. Take 1 tablet in the morning 8am 2 tablets at 4pm 2 tablets at 9pm         . HYDROcodone-acetaminophen (  LORCET) 10-650 MG per tablet   Oral   Take 1 tablet by mouth every 6 (six) hours as needed for pain. For pain         . ibuprofen (ADVIL,MOTRIN) 800 MG tablet   Oral   Take 800 mg by mouth every 8 (eight) hours as needed for pain. For pain         . IRON PO   Oral   Take 1 tablet by mouth daily.         Marland Kitchen lamoTRIgine (LAMICTAL) 200 MG tablet   Oral   Take 200 mg by mouth at bedtime.         Marland Kitchen LORazepam (ATIVAN) 0.5 MG tablet   Oral   Take 0.5 mg by mouth 2 (two) times daily. scheduled         . methocarbamol (ROBAXIN) 500 MG tablet   Oral   Take 500 mg by mouth every 8 (eight) hours as needed. Muscle spasms         . norethindrone-ethinyl estradiol (MICROGESTIN,JUNEL,LOESTRIN) 1-20 MG-MCG tablet   Oral   Take 1 tablet by mouth daily.         . promethazine (PHENERGAN) 25 MG tablet   Oral   Take 1 tablet (25 mg total) by mouth every 6  (six) hours as needed for nausea. nausea   30 tablet   0   . venlafaxine XR (EFFEXOR-XR) 150 MG 24 hr capsule   Oral   Take 2 capsules (300 mg total) by mouth daily.   60 capsule   1   . zolpidem (AMBIEN) 10 MG tablet   Oral   Take 10 mg by mouth at bedtime as needed for sleep. For sleep         . zonisamide (ZONEGRAN) 100 MG capsule   Oral   Take 300 mg by mouth at bedtime.         Marland Kitchen lactulose (CHRONULAC) 10 GM/15ML solution   Oral   Take 20 g by mouth daily as needed. For constipation           BP 135/95  Pulse 98  Temp(Src) 97.8 F (36.6 C) (Oral)  Resp 20  SpO2 98% Vitals reviewed Physical Exam Physical Examination: General appearance - alert, well appearing, and in no distress Mental status - alert, oriented to person, place, and time Eyes - no conjunctival injection, no scleral icterus Mouth - mucous membranes moist, pharynx normal without lesions Neck - supple, no significant adenopathy, well healing horizontal surgical incision site in mid lower neck Chest - clear to auscultation, no wheezes, rales or rhonchi, symmetric air entry Heart - normal rate, regular rhythm, normal S1, S2, no murmurs, rubs, clicks or gallops Abdomen - soft, nontender, nondistended, no masses or organomegaly Extremities - peripheral pulses normal, no pedal edema, no clubbing or cyanosis Skin - normal coloration and turgor, no rashes, no suspicious skin lesions noted Psych- anxious, responding to internal stimuli, but cooperative and answering questions  ED Course  Procedures (including critical care time)  12:19 AM labs obtained and reassuring.  Pt is medically cleared, will order telepsych consult.   Labs Reviewed  CBC - Abnormal; Notable for the following:    MCH 25.6 (*)    RDW 16.7 (*)    All other components within normal limits  COMPREHENSIVE METABOLIC PANEL - Abnormal; Notable for the following:    Sodium 134 (*)    Total Bilirubin 0.1 (*)    All other components  within  normal limits  URINE RAPID DRUG SCREEN (HOSP PERFORMED) - Abnormal; Notable for the following:    Opiates POSITIVE (*)    All other components within normal limits  ETHANOL  PROLACTIN  TSH  T4   No results found.   1. Hallucinations   2. Anxiety       MDM  Pt with hx of bipolar presents with worsening hallucinations and anxiety.  She also has hx of MEN, type 1.  Will check thyroid studies, prolactin level (hx of pituitary adenoma), also calcium levels- recent parathryoidectomy.  Per family these are all stable after admission at [facility] states that they were advised to come to the ED for admission to behavioral health because alan Watts- PA-c states he is not able to adjust her medications as an outpatient.  Once medically cleared will need telepsych.         Ethelda Chick, MD 05/19/12 4098  Ethelda Chick, MD 05/20/12 [phone]

## 2012-05-19 NOTE — ED Notes (Signed)
Pts mom Cell phone [phone] [facility]

## 2012-05-19 NOTE — ED Notes (Signed)
Pt given multiple beverages. Pt states she continues to be unable to give urine sample.

## 2012-05-19 NOTE — Telephone Encounter (Signed)
Returned patient's call and spoke with mother regarding pt's desire to have medications resumed from prior care.  Told mother pt must be seen in order to determine what changes are appropriate.  If cannot wait until appt on 2/26, can request admission to inpatient unit.

## 2012-05-19 NOTE — ED Notes (Signed)
Pts belonging at desk, mother will take belongings home when she leaves.

## 2012-05-19 NOTE — ED Notes (Signed)
Patient reports that she is hearing things today and that the voices are tell her that she is not pretty and about her teeth. The patient denies any SI/ HI. The patient reports that she is also having pain to her back. The patient is very anxious.

## 2012-05-20 DIAGNOSIS — R443 Hallucinations, unspecified: Secondary | ICD-10-CM | POA: Diagnosis not present

## 2012-05-20 MED ORDER — HALOPERIDOL 5 MG PO TABS
5.0000 mg | ORAL_TABLET | Freq: Three times a day (TID) | ORAL | Status: DC
  Administered 2012-05-20: 5 mg via ORAL
  Filled 2012-05-20: qty 1

## 2012-05-20 MED ORDER — LACTULOSE 10 GM/15ML PO SOLN
20.0000 g | Freq: Every day | ORAL | Status: DC | PRN
Start: 2012-05-20 — End: 2012-05-22
  Filled 2012-05-20: qty 30

## 2012-05-20 MED ORDER — FERROUS SULFATE 325 (65 FE) MG PO TABS
325.0000 mg | ORAL_TABLET | Freq: Two times a day (BID) | ORAL | Status: DC
  Administered 2012-05-20 – 2012-05-22 (×5): 325 mg via ORAL
  Filled 2012-05-20 (×7): qty 1

## 2012-05-20 MED ORDER — BENZTROPINE MESYLATE 1 MG PO TABS
0.5000 mg | ORAL_TABLET | Freq: Two times a day (BID) | ORAL | Status: DC
  Administered 2012-05-20 – 2012-05-22 (×6): 0.5 mg via ORAL
  Filled 2012-05-20 (×6): qty 1

## 2012-05-20 MED ORDER — VENLAFAXINE HCL ER 150 MG PO CP24: 150.0000 mg | ORAL_CAPSULE | Freq: Every day | ORAL | Status: DC

## 2012-05-20 MED ORDER — ACETAMINOPHEN 325 MG PO TABS
650.0000 mg | ORAL_TABLET | Freq: Four times a day (QID) | ORAL | Status: DC | PRN
  Administered 2012-05-20 – 2012-05-22 (×2): 650 mg via ORAL
  Filled 2012-05-20 (×2): qty 2

## 2012-05-20 MED ORDER — VENLAFAXINE HCL ER 150 MG PO CP24
300.0000 mg | ORAL_CAPSULE | Freq: Every day | ORAL | Status: DC
  Administered 2012-05-20 – 2012-05-22 (×3): 300 mg via ORAL
  Filled 2012-05-20 (×3): qty 2

## 2012-05-20 MED ORDER — ZONISAMIDE 100 MG PO CAPS
300.0000 mg | ORAL_CAPSULE | Freq: Every day | ORAL | Status: DC
  Administered 2012-05-20 – 2012-05-21 (×3): 300 mg via ORAL
  Filled 2012-05-20 (×4): qty 3

## 2012-05-20 MED ORDER — VITAMIN D3 25 MCG (1000 UNIT) PO TABS
1000.0000 [IU] | ORAL_TABLET | Freq: Every day | ORAL | Status: DC
  Administered 2012-05-20 – 2012-05-22 (×3): 1000 [IU] via ORAL
  Filled 2012-05-20 (×3): qty 1

## 2012-05-20 MED ORDER — LORAZEPAM 1 MG PO TABS: 1.0000 mg | ORAL_TABLET | Freq: Every evening | ORAL | Status: DC | PRN

## 2012-05-20 MED ORDER — PROMETHAZINE HCL 25 MG PO TABS
25.0000 mg | ORAL_TABLET | Freq: Four times a day (QID) | ORAL | Status: DC | PRN
  Administered 2012-05-21 – 2012-05-22 (×2): 25 mg via ORAL
  Filled 2012-05-20 (×2): qty 1

## 2012-05-20 MED ORDER — NORETHINDRONE-ETH ESTRADIOL 0.4-35 MG-MCG PO TABS
1.0000 | ORAL_TABLET | Freq: Every day | ORAL | Status: DC
  Administered 2012-05-20 – 2012-05-22 (×3): 1 via ORAL

## 2012-05-20 MED ORDER — CALCIUM CARBONATE 1250 (500 CA) MG PO TABS
1250.0000 mg | ORAL_TABLET | Freq: Every day | ORAL | Status: DC
  Administered 2012-05-20 – 2012-05-22 (×3): 1250 mg via ORAL
  Filled 2012-05-20 (×3): qty 1

## 2012-05-20 MED ORDER — IBUPROFEN 800 MG PO TABS
800.0000 mg | ORAL_TABLET | Freq: Three times a day (TID) | ORAL | Status: DC | PRN
  Administered 2012-05-21: 800 mg via ORAL
  Filled 2012-05-20 (×2): qty 1

## 2012-05-20 MED ORDER — LAMOTRIGINE 200 MG PO TABS
200.0000 mg | ORAL_TABLET | Freq: Every day | ORAL | Status: DC
  Administered 2012-05-20 – 2012-05-21 (×3): 200 mg via ORAL
  Filled 2012-05-20 (×4): qty 1

## 2012-05-20 MED ORDER — HALOPERIDOL 5 MG PO TABS
10.0000 mg | ORAL_TABLET | ORAL | Status: DC
  Administered 2012-05-20 – 2012-05-22 (×4): 10 mg via ORAL
  Filled 2012-05-20 (×2): qty 1
  Filled 2012-05-20 (×2): qty 2
  Filled 2012-05-20: qty 1

## 2012-05-20 MED ORDER — METHOCARBAMOL 500 MG PO TABS
500.0000 mg | ORAL_TABLET | Freq: Three times a day (TID) | ORAL | Status: DC | PRN
  Administered 2012-05-20 – 2012-05-22 (×3): 500 mg via ORAL
  Filled 2012-05-20 (×3): qty 1

## 2012-05-20 MED ORDER — HALOPERIDOL 5 MG PO TABS
5.0000 mg | ORAL_TABLET | ORAL | Status: DC
  Administered 2012-05-21 – 2012-05-22 (×2): 5 mg via ORAL
  Filled 2012-05-20 (×2): qty 1

## 2012-05-20 NOTE — ED Notes (Signed)
telepsych in progress 

## 2012-05-20 NOTE — ED Notes (Signed)
Pt signed consent for her parents to receive updates about her while she's here it's in her chart. Pt allowed to call mom to see if she'd bring birth control pills because she said if she doesn't take them she starts her menses.

## 2012-05-20 NOTE — ED Notes (Signed)
TELEPSYCH INFO FAXED/CALLED. 

## 2012-05-20 NOTE — ED Notes (Signed)
Pt's mom brought in pt's birth control pills from home. EDP to order.

## 2012-05-20 NOTE — ED Provider Notes (Addendum)
Care assumed at sign out. DELAILA NAND is a 27 y.o. female here with hearing voices. Calm this AM, ambulating in the ED normally. No active issues as per nursing.Telepsych pending.   Richardean Canal, MD 05/20/12 0805  9:31 AM Telepsych (Dr. Leretha Pol) recommend d/c as patient doesn't qualify for inpatient psych admission. Recommend dec effexor to 150mg  QAm and d/c ambien, inc ativan 0.5 to 1mg  in AM, 1 in PM, 2 at bedtime. Continue other psych meds. I talked to patient regarding the plan. She is not currently suicidal or homicidal. Understands instructions. Will go home with mother.   Richardean Canal, MD 05/20/12 0933  11:05 AM Mother called by staff. Mother mentioned that she was told that the daughter should be admitted by a psychiatrist at behavioral health. She has bed pending. Mother is not comfortable taking her home. I think patient should still get inpatient psych admit.   Richardean Canal, MD 05/20/12 [phone]

## 2012-05-20 NOTE — ED Notes (Signed)
Pt said that at 1600 she was given 5 mg haldol instead of 10mg . Sabino Snipes at pharmacy because pt's orders should be Haldol 5mg  at 8a, 10 mg at 1600 and then 10 mg at 2100. Jill Alexanders will change in system.

## 2012-05-20 NOTE — BH Assessment (Addendum)
[facility] Assessment Progress Note      05/20/12. 1030.  Telepsych has evaluated pt and recommended discharge.  Dr Silverio Lay at [facility] also spoke with pt and is in agreement that she can be discharged.  Pt's mother, Eithel Ryall, was called and she still wants her daughter admitted.  She reports Jorje Guild sees pt on outpt basis and he recommended that she be admitted after outpt med adjustments were not successful.  Pt was taken to [facility], assessed, and then sent to [facility] due to lack of beds at [facility].  [facility] has accepted pt pending an available bed.  Mailee Klaas lives in [place] but is going to come to the hospital to discuss further. Daleen Squibb, LCSWA  05/20/12. 1100.  Discussed pt with Dr Silverio Lay again.  He was unaware of the above and is now in agreement that pt remain at [facility] pending available bed at [facility].  Pt's mother informed. Daleen Squibb, LCSWA

## 2012-05-20 NOTE — ED Notes (Addendum)
Explained to pt about the mix up with how her haldol was ordered and assured her it will be fixed and writer will give 10 mg at 2100 but cannot give the other 5 mg of haldol from 1600 as she requested. Pt indicated full understanding.

## 2012-05-21 DIAGNOSIS — R443 Hallucinations, unspecified: Secondary | ICD-10-CM | POA: Diagnosis not present

## 2012-05-21 NOTE — ED Provider Notes (Signed)
Ashlee Day is a 27 y.o. female here with psychosis. Accepted at [facility], pending bed availability. She has been asking for pain meds yesterday for her back and menstrual cramps and was given motrin, tylenol prn. She has been wanting narcotics but is comfortably walking around. Suspecting drug seeking behavior. Will hold narcotics for now. Otherwise medically stable. Sleeping comfortably this AM, no other issues.    Richardean Canal, MD 05/21/12 [phone]

## 2012-05-22 ENCOUNTER — Telehealth (HOSPITAL_COMMUNITY): Payer: Self-pay

## 2012-05-22 ENCOUNTER — Inpatient Hospital Stay (HOSPITAL_COMMUNITY)
Admission: AD | Admit: 2012-05-22 | Discharge: 2012-05-30 | DRG: 885 | Disposition: A | Discharge status: Home / Self Care | Payer: No Typology Code available for payment source | Source: Intra-hospital | Attending: Psychiatry | Admitting: Psychiatry

## 2012-05-22 ENCOUNTER — Inpatient Hospital Stay (HOSPITAL_COMMUNITY): Admission: AD | Admit: 2012-05-22 | Payer: Self-pay | Source: Intra-hospital

## 2012-05-22 DIAGNOSIS — F333 Major depressive disorder, recurrent, severe with psychotic symptoms: Principal | ICD-10-CM | POA: Diagnosis present

## 2012-05-22 DIAGNOSIS — F329 Major depressive disorder, single episode, unspecified: Secondary | ICD-10-CM

## 2012-05-22 DIAGNOSIS — M797 Fibromyalgia: Secondary | ICD-10-CM

## 2012-05-22 DIAGNOSIS — K5900 Constipation, unspecified: Secondary | ICD-10-CM

## 2012-05-22 DIAGNOSIS — F607 Dependent personality disorder: Secondary | ICD-10-CM | POA: Diagnosis present

## 2012-05-22 DIAGNOSIS — Z79899 Other long term (current) drug therapy: Secondary | ICD-10-CM

## 2012-05-22 DIAGNOSIS — F319 Bipolar disorder, unspecified: Secondary | ICD-10-CM

## 2012-05-22 DIAGNOSIS — R443 Hallucinations, unspecified: Secondary | ICD-10-CM | POA: Diagnosis not present

## 2012-05-22 DIAGNOSIS — G43909 Migraine, unspecified, not intractable, without status migrainosus: Secondary | ICD-10-CM

## 2012-05-22 DIAGNOSIS — F603 Borderline personality disorder: Secondary | ICD-10-CM | POA: Diagnosis present

## 2012-05-22 DIAGNOSIS — F39 Unspecified mood [affective] disorder: Secondary | ICD-10-CM

## 2012-05-22 MED ORDER — HALOPERIDOL 5 MG PO TABS
10.0000 mg | ORAL_TABLET | ORAL | Status: DC
  Administered 2012-05-22: 10 mg via ORAL
  Filled 2012-05-22 (×4): qty 2

## 2012-05-22 MED ORDER — HALOPERIDOL 5 MG PO TABS
5.0000 mg | ORAL_TABLET | Freq: Every day | ORAL | Status: DC
  Administered 2012-05-23: 5 mg via ORAL
  Filled 2012-05-22 (×2): qty 1

## 2012-05-22 MED ORDER — BENZTROPINE MESYLATE 0.5 MG PO TABS
0.5000 mg | ORAL_TABLET | Freq: Two times a day (BID) | ORAL | Status: DC
  Administered 2012-05-22 – 2012-05-23 (×2): 0.5 mg via ORAL
  Filled 2012-05-22 (×5): qty 1

## 2012-05-22 MED ORDER — VITAMIN D3 25 MCG (1000 UNIT) PO TABS
1000.0000 [IU] | ORAL_TABLET | Freq: Every day | ORAL | Status: DC
  Administered 2012-05-22 – 2012-05-30 (×9): 1000 [IU] via ORAL
  Filled 2012-05-22 (×7): qty 1
  Filled 2012-05-22: qty 7
  Filled 2012-05-22 (×6): qty 1

## 2012-05-22 MED ORDER — CALCIUM CARBONATE 600 MG PO TABS: 600.0000 mg | ORAL_TABLET | Freq: Every day | ORAL | Status: DC

## 2012-05-22 MED ORDER — ZONISAMIDE 100 MG PO CAPS
300.0000 mg | ORAL_CAPSULE | Freq: Every day | ORAL | Status: DC
  Administered 2012-05-22 – 2012-05-29 (×8): 300 mg via ORAL
  Filled 2012-05-22 (×6): qty 3
  Filled 2012-05-22: qty 21
  Filled 2012-05-22 (×6): qty 3

## 2012-05-22 MED ORDER — ALUM & MAG HYDROXIDE-SIMETH 200-200-20 MG/5ML PO SUSP: 30.0000 mL | ORAL | Status: DC | PRN

## 2012-05-22 MED ORDER — VENLAFAXINE HCL ER 150 MG PO CP24
300.0000 mg | ORAL_CAPSULE | Freq: Every day | ORAL | Status: DC
  Administered 2012-05-22 – 2012-05-30 (×9): 300 mg via ORAL
  Filled 2012-05-22: qty 1
  Filled 2012-05-22 (×9): qty 2
  Filled 2012-05-22: qty 1
  Filled 2012-05-22: qty 2
  Filled 2012-05-22: qty 14

## 2012-05-22 MED ORDER — LAMOTRIGINE 100 MG PO TABS
200.0000 mg | ORAL_TABLET | Freq: Every day | ORAL | Status: DC
  Administered 2012-05-22 – 2012-05-29 (×8): 200 mg via ORAL
  Filled 2012-05-22 (×3): qty 1
  Filled 2012-05-22: qty 14
  Filled 2012-05-22 (×6): qty 1
  Filled 2012-05-22: qty 2

## 2012-05-22 MED ORDER — MAGNESIUM HYDROXIDE 400 MG/5ML PO SUSP: 30.0000 mL | Freq: Every day | ORAL | Status: DC | PRN

## 2012-05-22 MED ORDER — PROMETHAZINE HCL 25 MG PO TABS
25.0000 mg | ORAL_TABLET | Freq: Four times a day (QID) | ORAL | Status: DC | PRN
  Administered 2012-05-22 – 2012-05-23 (×2): 25 mg via ORAL
  Filled 2012-05-22 (×3): qty 1

## 2012-05-22 MED ORDER — ACETAMINOPHEN 325 MG PO TABS
650.0000 mg | ORAL_TABLET | Freq: Four times a day (QID) | ORAL | Status: DC | PRN
  Administered 2012-05-22 – 2012-05-28 (×8): 650 mg via ORAL

## 2012-05-22 MED ORDER — NORETHINDRONE ACET-ETHINYL EST 1-20 MG-MCG PO TABS
1.0000 | ORAL_TABLET | Freq: Every day | ORAL | Status: DC
  Administered 2012-05-24 – 2012-05-30 (×7): 1 via ORAL

## 2012-05-22 MED ORDER — TRAZODONE HCL 50 MG PO TABS
50.0000 mg | ORAL_TABLET | Freq: Every evening | ORAL | Status: DC | PRN
  Administered 2012-05-22 – 2012-05-23 (×3): 50 mg via ORAL
  Filled 2012-05-22 (×11): qty 1

## 2012-05-22 MED ORDER — CHLORDIAZEPOXIDE HCL 25 MG PO CAPS
25.0000 mg | ORAL_CAPSULE | Freq: Four times a day (QID) | ORAL | Status: DC | PRN
  Administered 2012-05-22 – 2012-05-24 (×3): 25 mg via ORAL
  Filled 2012-05-22 (×3): qty 1

## 2012-05-22 MED ORDER — CALCIUM CARBONATE 1250 (500 CA) MG PO TABS
1.0000 | ORAL_TABLET | Freq: Every day | ORAL | Status: DC
  Administered 2012-05-23 – 2012-05-30 (×8): 500 mg via ORAL
  Filled 2012-05-22 (×8): qty 1
  Filled 2012-05-22: qty 7
  Filled 2012-05-22: qty 1

## 2012-05-22 MED ORDER — IBUPROFEN 800 MG PO TABS
800.0000 mg | ORAL_TABLET | Freq: Three times a day (TID) | ORAL | Status: DC | PRN
  Administered 2012-05-23 – 2012-05-25 (×6): 800 mg via ORAL
  Filled 2012-05-22 (×6): qty 1

## 2012-05-22 MED ORDER — METHOCARBAMOL 500 MG PO TABS
500.0000 mg | ORAL_TABLET | Freq: Three times a day (TID) | ORAL | Status: DC | PRN
  Administered 2012-05-23: 500 mg via ORAL
  Filled 2012-05-22: qty 1

## 2012-05-22 NOTE — Progress Notes (Signed)
Pt received lunch tray 

## 2012-05-22 NOTE — ED Provider Notes (Signed)
Patient was admitted for having auditory hallucinations that were mainly saying things that the patient was not pretty. She states that she is still hearing voices. She also states she has some back pain but cannot tell me how long she's had it. She states the voices are telling her "the back pain is better". They are also still telling her that she's not pretty. Per the act team patient has been accepted at behavioral health pending a bed.  Ward Givens, MD 05/22/12 [phone]

## 2012-05-22 NOTE — Progress Notes (Signed)
Patient ID: Ashlee Day, female   DOB: Oct 05, 1985, 27 y.o.   MRN: [id number] D: pt. Pacing the hall, does not answer when name initially called, but talks when writer directly approaches and ask what her name is. Pt. Reports AVH. Smiles when ask if hearing voices. Pt. Denies SHI.  Pt. Report back pain see assessment. A: Writer introduced self to pt. Encouraged her to verbalize needs. Writer reports to pt. meds will be reviewed and given as requested for pain.  Staff will monitor q59min for safety. Staff encouraged group. R: Pt. In agreement with Clinical research associate about med review and then administration.  Pt. Is safe on the unit.

## 2012-05-22 NOTE — [facility] Counselor (Signed)
Pt accepted by Alvy Beal, PA, adult unit 406/1, attending Dr. Jannifer Franklin. Documents faxed to [facility]: pt signed release of information and voluntary admission, admission check list, ur form and assessment notification. Ranae Pila, LCAS

## 2012-05-22 NOTE — ED Provider Notes (Signed)
Pt has been accepted at [facility] by Dr Olen Pel, MD 05/22/12 [phone]

## 2012-05-22 NOTE — ED Notes (Signed)
Chaplain saw pt on rounds.  Introduced spiritual care as resource and provided support around pt transition to [facility].    Pt supported by mother and father, with whom she lives.  Pt spoke with chaplain about methods she uses to cope with stress.  Spoke about music and working with clay.  Will continue to follow as needed.  Please page as needs arise.    Belva Crome  MDiv

## 2012-05-22 NOTE — Tx Team (Signed)
Initial Interdisciplinary Treatment Plan  PATIENT STRENGTHS: (choose at least two) Ability for insight Capable of independent living General fund of knowledge Motivation for treatment/growth Religious Affiliation Supportive family/friends  PATIENT STRESSORS: Health problems   PROBLEM LIST: Problem List/Patient Goals Date to be addressed Date deferred Reason deferred Estimated date of resolution   "I want the voices to go away."    05/22/12      Psychosis   05/22/12                                                DISCHARGE CRITERIA:  Ability to meet basic life and health needs Adequate post-discharge living arrangements Improved stabilization in mood, thinking, and/or behavior Medical problems require only outpatient monitoring Motivation to continue treatment in a less acute level of care Need for constant or close observation no longer present Reduction of life-threatening or endangering symptoms to within safe limits Safe-care adequate arrangements made Verbal commitment to aftercare and medication compliance  PRELIMINARY DISCHARGE PLAN: Return to previous work or school arrangements  PATIENT/FAMIILY INVOLVEMENT: This treatment plan has been presented to and reviewed with the patient, Ashlee Day, and/or family member "*.  The patient and family have been given the opportunity to ask questions and make suggestions.  Fransisca Kaufmann ANN 05/22/2012, 5:49 PM

## 2012-05-22 NOTE — Progress Notes (Signed)
Patient ID: Ashlee Day, female   DOB: 1985-07-01, 27 y.o.   MRN: [id number] Patient admitted voluntarily to obtain help with hearing voices. The patient reports that she hears a female voice telling her "you are not pretty." Also sees butterfly's and monkeys that are right in front of her. The patient was brought in by her mother to be assessed. Patient reports that she has heard voices since seventeen years of ago but that they were worsened by thyroid problems. She reports that haldol is helping to decrease the voices and that she wishes to remain on it. Patient was very anxious during the admission. She was observed to be rocking back and forth. Patient was able to answer questions but with a significant time delay. Patient stated to writer "God is coaching me through this." She lives with her parents and identifies them as her major supports. The patient does not work and receives disability. She is very pleasant during the admission process. Denies SI/HI. Able to contract for her safety. Oriented to the 400 hall unit and routine.

## 2012-05-22 NOTE — Progress Notes (Signed)
Pt received breakfast tray 

## 2012-05-23 ENCOUNTER — Encounter (HOSPITAL_COMMUNITY): Payer: Self-pay | Admitting: Psychiatry

## 2012-05-23 DIAGNOSIS — F429 Obsessive-compulsive disorder, unspecified: Secondary | ICD-10-CM

## 2012-05-23 DIAGNOSIS — F333 Major depressive disorder, recurrent, severe with psychotic symptoms: Principal | ICD-10-CM | POA: Diagnosis present

## 2012-05-23 LAB — COMPREHENSIVE METABOLIC PANEL
ALT: 12 U/L (ref 0–35)
Alkaline Phosphatase: 88 U/L (ref 39–117)
BUN: 16 mg/dL (ref 6–23)
CO2: 24 mEq/L (ref 19–32)
Chloride: 102 mEq/L (ref 96–112)
GFR calc Af Amer: >90 mL/min (ref >90)
GFR calc non Af Amer: >90 mL/min (ref >90)
Glucose, Bld: 85 mg/dL (ref 70–99)
Potassium: 3.6 mEq/L (ref 3.5–5.1)
Sodium: 138 mEq/L (ref 135–145)
Total Bilirubin: 0.1 mg/dL — ABNORMAL LOW (ref 0.3–1.2)

## 2012-05-23 LAB — CBC
HCT: 41 % (ref 36.0–46.0)
Hemoglobin: 13.1 g/dL (ref 12.0–15.0)
RBC: 5.11 MIL/uL (ref 3.87–5.11)

## 2012-05-23 MED ORDER — HALOPERIDOL 5 MG PO TABS
10.0000 mg | ORAL_TABLET | Freq: Two times a day (BID) | ORAL | Status: DC
  Filled 2012-05-23 (×3): qty 2

## 2012-05-23 MED ORDER — GABAPENTIN 300 MG PO CAPS
300.0000 mg | ORAL_CAPSULE | Freq: Three times a day (TID) | ORAL | Status: DC
  Administered 2012-05-23 (×2): 300 mg via ORAL
  Filled 2012-05-23 (×7): qty 1

## 2012-05-23 MED ORDER — GABAPENTIN 300 MG PO CAPS
300.0000 mg | ORAL_CAPSULE | Freq: Three times a day (TID) | ORAL | Status: DC
  Administered 2012-05-24 – 2012-05-30 (×19): 300 mg via ORAL
  Filled 2012-05-23 (×3): qty 1
  Filled 2012-05-23 (×2): qty 21
  Filled 2012-05-23 (×20): qty 1
  Filled 2012-05-23: qty 21
  Filled 2012-05-23 (×2): qty 1

## 2012-05-23 MED ORDER — HALOPERIDOL 5 MG PO TABS
10.0000 mg | ORAL_TABLET | Freq: Two times a day (BID) | ORAL | Status: DC
  Administered 2012-05-23 – 2012-05-27 (×8): 10 mg via ORAL
  Filled 2012-05-23 (×11): qty 2

## 2012-05-23 MED ORDER — GABAPENTIN 600 MG PO TABS
300.0000 mg | ORAL_TABLET | Freq: Three times a day (TID) | ORAL | Status: DC
  Filled 2012-05-23 (×3): qty 0.5

## 2012-05-23 MED ORDER — BENZTROPINE MESYLATE 1 MG PO TABS
1.0000 mg | ORAL_TABLET | Freq: Two times a day (BID) | ORAL | Status: DC
  Administered 2012-05-23 – 2012-05-30 (×14): 1 mg via ORAL
  Filled 2012-05-23 (×6): qty 1
  Filled 2012-05-23 (×2): qty 14
  Filled 2012-05-23 (×10): qty 1

## 2012-05-23 NOTE — Clinical Social Work Note (Signed)
[facility] LCSW Group Therapy  05/23/2012 , 2:59 PM   Type of Therapy:  Group Therapy  Participation Level: Did not attend    Summary of Progress/Problems: Today's group focused on the term Diagnosis.  Participants were asked to define the term, and then pronounce whether it is a negative, positive or neutral term.  Daryel Gerald B 05/23/2012 , 2:59 PM

## 2012-05-23 NOTE — Progress Notes (Signed)
Patient ID: Ashlee Day, female   DOB: 1985-07-16, 27 y.o.   MRN: [id number]  D: Pt endorses AH, and was requesting Haldol Pt denies SI/HI/VH. Pt is pleasant and cooperative. Pt continues to exhibit Med seeking behaviors.  A: Pt was offered support and encouragement. Pt was given scheduled medications. Pt was encourage to attend groups. Q 15 minute checks were done for safety. Pt given Tylenol at 2239 for back pain that was 7 out of 10, librium at 0116, Advil at 0229, and pt given an Ice Pack at 0235 for relief for back.   R: Pt is taking medication.Pt receptive to treatment and safety maintained on unit.

## 2012-05-23 NOTE — Progress Notes (Signed)
D:  Patient with multiple somatic complaints.  Requested anxiety medication, Phenergan, pain medication, and muscle relaxants.  When the group was at recreation therapy patient stated she did not feel well and thought her blood sugar was low.  She was brought back to the unit and her sugar was checked.  She has had multiple questions about medication changes today.  A:  Informed patient of all medication changes.  Encouraged her to participate in all group activities.  Encouraged patient to take medications that do not have a potential for abuse or addiction.  Blood sugar checked per patient request.   R:  Blood glucose level was 141 when patient thought it was low.  Verbalized understanding of medication changes.  Not attending groups and minimal interaction with peers.

## 2012-05-23 NOTE — [facility] Suicide Risk Assessment (Signed)
Suicide Risk Assessment  Admission Assessment     Nursing information obtained from:  Patient Demographic factors:  Caucasian Current Mental Status:    Loss Factors:    Historical Factors:  Impulsivity Risk Reduction Factors:  Sense of responsibility to family;Religious beliefs about death;Living with another person, especially a relative;Positive social support  CLINICAL FACTORS:   Severe Anxiety and/or Agitation Bipolar Disorder:   Depressive phase Depression:   Delusional Hopelessness Insomnia Currently Psychotic  COGNITIVE FEATURES THAT CONTRIBUTE TO RISK:  Closed-mindedness Polarized thinking    SUICIDE RISK:   Minimal: No identifiable suicidal ideation.  Patients presenting with no risk factors but with morbid ruminations; may be classified as minimal risk based on the severity of the depressive symptoms  PLAN OF CARE:1. Admit for crisis management and stabilization. 2. Medication management to reduce current symptoms to base line and improve the     patient's overall level of functioning 3. Treat health problems as indicated. 4. Develop treatment plan to decrease risk of relapse upon discharge and the need for     readmission. 5. Psycho-social education regarding relapse prevention and self care. 6. Health care follow up as needed for medical problems. 7. Restart home medications where appropriate.   I certify that inpatient services furnished can reasonably be expected to improve the patient's condition.  Thedore Mins, MD 05/23/2012, 11:37 AM
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
## 2012-05-23 NOTE — Progress Notes (Signed)
[facility] LCSW Aftercare Discharge Planning Group Note  05/23/2012 2:57 PM  Participation Quality:  Leata declined to come to group.  I found her in her bed, and she told me in a matter of fact tone that she thought she was having a seizure.  I informed her nurse.    Daryel Gerald B 05/23/2012, 2:57 PM

## 2012-05-23 NOTE — Treatment Plan (Signed)
Interdisciplinary Treatment Plan Update (Adult)  Date: 05/23/2012  Time Reviewed: 8:05 AM   Progress in Treatment: Attending groups: No Participating in groups: No Taking medication as prescribed: Yes Tolerating medication: Yes   Family/Significant other contact made:  No Patient understands diagnosis:  Yes  As evidenced by asking for help with psychosis, mood stabilization Discussing patient identified problems/goals with staff:  Yes  See below Medical problems stabilized or resolved:  Yes Denies suicidal/homicidal ideation: Yes  In tx team Issues/concerns per patient self-inventory:  Yes  Rates depression 5 and hopelessness 9  C/O lack of sleep, and has multiple somatic complaints Other:  New problem(s) identified: N/A  Reason for Continuation of Hospitalization: Anxiety Depression Hallucinations Medication stabilization  Interventions implemented related to continuation of hospitalization: Neurontin, Haldol, Lamictal, Trazodone, Effexor trail  Encourage group attendance and participation  Additional comments:  Estimated length of stay: 4-5 days  Discharge Plan: return home, follow up outpt  New goal(s): N/A  Review of initial/current patient goals per problem list:   1.  Goal(s): Eliminate SI  Met:  Yes  Target date:2/18  As evidenced [id number] report  2.  Goal (s): Eliminate psychosis with the use of medication/therpaeutic milieu  Met:  No  Target date:2/21  As evidenced [id number] is complaining of hearing voices, seeing things [like animals] that are not there, and recurrent obsessive thoughts  3.  Goal(s): Stabilize mood with the use of medication/therapeutic milieu  Met:  No  Target date:2/21  As evidenced [id number] is complaining of unmanageable anxiety [asking for benzos] and depression  4.  Goal(s): Identify outpt provider  Met:  No  Target date:2/21  As evidenced [id number] of appointment time and date  Attendees: Patient:  Ashlee Day 05/23/2012 8:05AM  Family:     Physician:  Thedore Mins 05/23/2012 8:05 AM   Nursing:  Joslyn Devon  05/23/2012 8:05 AM   Clinical Social Worker:  Richelle Ito 05/23/2012 8:05 AM   Extender:  Verne Spurr PA 05/23/2012 8:05 AM   Other:     Other:     Other:     Other:      Scribe for Treatment Team:   Ida Rogue, 05/23/2012 8:05 AM

## 2012-05-24 DIAGNOSIS — F333 Major depressive disorder, recurrent, severe with psychotic symptoms: Principal | ICD-10-CM

## 2012-05-24 NOTE — Progress Notes (Signed)
D: Patient denies SI/HI. The patient states that she is positive for auditory and visual hallucinations. The patient reports seeing her "facebook friends" and hearing them say that she "isn't pretty." The patient has an anxious mood and affect. The patient also displays medication seeking behaviors and frequently asks RN what medications are available to her. The patient is attending groups and interacting appropriately within the milieu.  A: Patient given emotional support from RN. Patient encouraged to come to staff with concerns and/or questions. Patient's medication routine continued. Patient's orders and plan of care reviewed.  R: Patient remains cooperative. Will continue to monitor patient q15 minutes for safety.
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
## 2012-05-24 NOTE — Clinical Social Work Note (Signed)
[facility] Group Notes:  (Counselor/Nursing/MHT/Case Management/Adjunct)  02/18/2012 12:00 PM  Type of Therapy:  Group Therapy  Participation Level:  Did Not Attend  Smart, Heather N 02/18/2012, 12:00 PM  

## 2012-05-24 NOTE — Progress Notes (Signed)
Patient ID: Ashlee Day, female   DOB: October 29, 1985, 27 y.o.   MRN: [id number] D: Pt. Visited with mother earlier this evening, currently reports depression at "5" of 10. Pt. Reports having problems with meds being discontinued. A: Writer encouraged pt. To speak with physician about medication regime.  Writer reviewed med available tonight. Pt. Will be be monitored q63min for safety.  R: Pt. Is safe on the unit. Pt. Did not attend group.

## 2012-05-24 NOTE — Progress Notes (Signed)
Recreation Therapy Notes  Date: 02.19.2014  Time: 9:30am      Group Topic/Focus: Leisure Education  Participation Level: Active  Participation Quality: Appropriate  Affect: Appropriate  Cognitive: Appropriate   Additional Comments: Patient arrived approximately 5 minutes late to group. Patient peer explained the group activity to patient. Patient stated she understood what the group activity was and she began participating with group. Patient with peers selected cards from a deck. Based on the card the patient had to draw a recreation activity, act out a recreation activity or name two positive emotions associated with recreation and leisure. Patient was able to successfully draw and act out chosen recreation activities. Approximately 20 minutes into group, patient could be seen rocking back and forth in chair. After approximately 2 minutes patient stood and stated "I need to go but I'm coming back." Patient returned to group after approximately 5 minutes.  Marykay Lex Ariam Mol, LRT/CTRS     Martrell Eguia L 05/24/2012 1:09 PM

## 2012-05-24 NOTE — Progress Notes (Signed)
Psychoeducational Group Note  Date:  05/24/2012 Time:  2000  Group Topic/Focus:  Wrap-Up Group:   The focus of this group is to help patients review their daily goal of treatment and discuss progress on daily workbooks.  Participation Level: Did Not Attend  Participation Quality:  Not Applicable  Affect:  Not Applicable  Cognitive:  Not Applicable  Insight:  Not Applicable  Engagement in Group: Not Applicable  Additional Comments:  Pt did not attend  Christ Kick 05/24/2012, 9:35 PM

## 2012-05-24 NOTE — Progress Notes (Signed)
[facility] LCSW Aftercare Discharge Planning Group Note  05/24/2012 1:53 PM  Participation Quality:  Did not attend    Ida Rogue 05/24/2012, 1:53 PM

## 2012-05-24 NOTE — Progress Notes (Signed)
Nutrition Brief Note  Patient identified on the Malnutrition Screening Tool (MST) Report  Body mass index is 34.03 kg/(m^2). Patient meets criteria for obesity grade 1 based on current BMI.   Wt Readings from Last 10 Encounters:  05/22/12 180 lb (81.647 kg)  12/16/11 207 lb (93.895 kg)    Patient reports weight loss but that she has been eating well overall.  Patient was very slow to answer questions.  Poor historian based on patient's stated weight history and data above.  Currently eating well.  Current diet order is regular, patient is consuming approximately 100% of meals at this time. Labs and medications reviewed.   No nutrition interventions warranted at this time. If nutrition issues arise, please consult RD.   Oran Rein, RD, LDN Clinical Inpatient Dietitian Pager:  [phone] Weekend and after hours pager:  [phone]

## 2012-05-25 DIAGNOSIS — Z79899 Other long term (current) drug therapy: Secondary | ICD-10-CM

## 2012-05-25 MED ORDER — NAPROXEN SODIUM 550 MG PO TABS: 550.0000 mg | ORAL_TABLET | Freq: Two times a day (BID) | ORAL | Status: DC

## 2012-05-25 MED ORDER — ONDANSETRON HCL 4 MG PO TABS
4.0000 mg | ORAL_TABLET | Freq: Two times a day (BID) | ORAL | Status: DC | PRN
  Administered 2012-05-28 – 2012-05-29 (×2): 4 mg via ORAL
  Filled 2012-05-25: qty 1

## 2012-05-25 MED ORDER — ONDANSETRON HCL 4 MG PO TABS: 4.0000 mg | ORAL_TABLET | Freq: Three times a day (TID) | ORAL | Status: DC | PRN

## 2012-05-25 MED ORDER — NAPROXEN 500 MG PO TABS
500.0000 mg | ORAL_TABLET | Freq: Two times a day (BID) | ORAL | Status: DC
  Administered 2012-05-25 – 2012-05-30 (×10): 500 mg via ORAL
  Filled 2012-05-25 (×2): qty 1
  Filled 2012-05-25: qty 14
  Filled 2012-05-25 (×13): qty 1
  Filled 2012-05-25: qty 14

## 2012-05-25 NOTE — Treatment Plan (Signed)
  Interdisciplinary Treatment Plan Update   Date Reviewed:  05/25/2012  Time Reviewed:  8:37 AM  Progress in Treatment:   Attending groups: No Participating in groups: No Taking medication as prescribed: Yes  Tolerating medication: Yes Family/Significant other contact made: Yes  Patient understands diagnosis: Yes  As evidenced by asking for help with mood instability  Discussing patient identified problems/goals with staff: Yes  See initial plan Medical problems stabilized or resolved: Yes Denies suicidal/homicidal ideation: Yes Patient has not harmed self or others: Yes  For review of initial/current patient goals, please see plan of care.  Estimated Length of Stay:  3-4 days  Reason for Continuation of Hospitalization: Medication stabilization  New Problems/Goals identified:  N/A  Discharge Plan or Barriers:   return home, follow up outpt  Additional Comments:  Pt has been attending no groups.  Tx team agreed today to lock pt out of room during programming hours to facilitate participation  Attendees:  Signature: Thedore Mins, MD 05/25/2012 8:37 AM   Signature: Richelle Ito, LCSW 05/25/2012 8:37 AM  Signature: Verne Spurr, PA 05/25/2012 8:37 AM  Signature: Joslyn Devon, RN 05/25/2012 8:37 AM  Signature:  05/25/2012 8:37 AM  Signature:  05/25/2012 8:37 AM  Signature:   05/25/2012 8:37 AM  Signature:    Signature:    Signature:    Signature:    Signature:    Signature:      Scribe for Treatment Team:   Richelle Ito, LCSW  05/25/2012 8:37 AM

## 2012-05-25 NOTE — Progress Notes (Signed)
Patient ID: Ashlee Day, female   DOB: 1986/03/20, 27 y.o.   MRN: [id number] D: Patient presented with depressed mood and flat affect. Pt interaction is childlike. Pt denies SI/HI. Pt continuous to have visual and auditory hallucinations  but stated "the voice has decreased with medication". Pt stated the voices are telling her "she is not beautiful". Pt attended evening karaoke. Pt is very irritable with my assessment stating she is not getting all her medications as she takes at home. No acute distressed noted at this time.   A: Met with pt 1:1. Medications administered as prescribed. Writer encouraged pt to discuss feelings. Pt encouraged to come to staff with any question or concerns. Writer explained pt medications and administration times.  R: Patient remains safe. She is complaint with medications and group programming. Continue current POC.

## 2012-05-25 NOTE — Clinical Social Work Note (Signed)
[facility] Group Notes:  (Counselor/Nursing/MHT/Case Management/Adjunct)  02/17/2012 2:20 PM  Type of Therapy:  Group Therapy  Participation Level:  Did Not Attend    Summary of Progress/Problems: .balance: The topic for group was balance in life.  Pt participated in the discussion about when their life was in balance and out of balance and how this feels.  Pt discussed ways to get back in balance and short term goals they can work on to get where they want to be.    Ashlee Day 02/17/2012, 2:20 PM  

## 2012-05-25 NOTE — Progress Notes (Signed)
[facility] LCSW Aftercare Discharge Planning Group Note  05/25/2012 3:14 PM  Participation Quality:  Did not attend    Ida Rogue 05/25/2012, 3:14 PM

## 2012-05-25 NOTE — Progress Notes (Signed)
D: Patient denies SI/HI. The patient states that she is positive for auditory and visual hallucinations. The patient reports seeing "butterflies chasing 'her' facebook friends" and hearing them say that she "isn't pretty." The patient has an anxious mood and affect. The patient also displays medication seeking behaviors and frequently asks RN what medications are available to her. The patient is attending some groups and needs much encouragement to attend.  A: Patient given emotional support from RN. Patient encouraged to come to staff with concerns and/or questions. Patient's medication routine continued. Patient's orders and plan of care reviewed.   R: Patient remains cooperative. Will continue to monitor patient q15 minutes for safety.

## 2012-05-25 NOTE — Progress Notes (Signed)
Adult Psychoeducational Group Note  Date:  05/25/2012 Time:  0930 Group Topic/Focus:  Self Esteem Action Plan:   The focus of this group is to help patients create a plan to continue to build self-esteem after discharge.  Participation Level:  None  Participation Quality:  Inattentive  Affect:  Flat  Cognitive:  Oriented  Insight: None  Engagement in Group:  Lacking  Modes of Intervention:  Education  Additional Comments:  Pt attend group but did not participate.  Lakhia Gengler E 05/25/2012, 11:37 AM
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
## 2012-05-26 NOTE — Progress Notes (Signed)
Patient ID: Ashlee Day, female   DOB: 22-Nov-1985, 27 y.o.   MRN: [id number] D: Patient mood/affect is depressed and anxious. Pt interaction is childlike. Pt denies SI/HI. Pt continuous to have visual and auditory hallucinations  but stated "the voice has decreased with medication". Pt stated the voices are telling her "she is not beautiful". Pt attended evening wrap up group and interacted well with peers. Pt cooperative with my assessment. No acute distressed noted at this time.   A: Met with pt 1:1. Medications administered as prescribed. Writer encouraged pt to discuss feelings and attend groups. Pt encouraged to come to staff with any question or concerns.   R: Patient remains safe. She is complaint with medications and group programming. Continue current POC.

## 2012-05-26 NOTE — Clinical Social Work Note (Signed)
[facility] LCSW Group Therapy  05/26/2012 3:03 PM   Type of Therapy:  Group Therapy  Participation Level:  Minimal  Participation Quality:  Limited  Affect:  Sleepy  Cognitive:  Appropriate  Insight:  Limited  Engagement in Therapy:  Limited  Modes of Intervention:  Clarification, Education, Exploration and Socialization  Summary of Progress/Problems: Today's group focused on relapse prevention.  We defined the term, and then brainstormed on ways to prevent relapse.Ashlee Day nodded off through group multiple times.  She did not contribute spontaneously to the conversation, and only spoke when asked directly.  Her comments lacked insight, and we learned nothing about how she relates to the term relapse.  Daryel Gerald B 05/26/2012 , 3:03 PM

## 2012-05-26 NOTE — Progress Notes (Signed)
Recreation Therapy Notes  05/26/2012  Time: 9:30am   Group Topic/Focus: Self Expression   Participation Level:  Did not attend    Meilani Edmundson L Syaire Saber, LRT/CTRS  

## 2012-05-26 NOTE — Progress Notes (Signed)
Patient ID: Ashlee Day, female   DOB: 1986-03-27, 27 y.o.   MRN: [id number] [facility] MD Progress Note  05/25/2012 3:30 PM Ashlee Day  MRN:  [id number]  Subjective:  Met with the patient to discuss her progress. She states she is doing much better and "I'm ready to go home." Debbe Bales goes on to say," She is a little bit nervous, because she wants to go home." And, she asks about "the pill for nightmares" but states she only had 1 nightmare last night, and "slept all night."  "The voices are 85% gone." Also reports that her myalgias are much better today.  Objective: Chart indicates that the patient did not attend any groups yesterday, and isolated in her room.  She changes her story when asking about medication and continues to engage in medication seeking behaviors.  Diagnosis:  MDD severe recurrent with psychotic features  ADL's:  Intact  Sleep:  "All night."  Appetite:  ok  Suicidal Ideation:  denies Homicidal Ideation:  denies AEB (as evidenced by):the patient's report of symptom reduction, affect, behaviors and ability to process in unit programming.  Psychiatric Specialty Exam: Review of Systems  Constitutional: Negative.  Negative for fever, chills, weight loss, malaise/fatigue and diaphoresis.  HENT: Negative for congestion and sore throat.   Eyes: Negative for blurred vision, double vision and photophobia.  Respiratory: Negative for cough, shortness of breath and wheezing.   Cardiovascular: Negative for chest pain, palpitations and PND.  Gastrointestinal: Negative for heartburn, nausea, vomiting, abdominal pain, diarrhea and constipation.  Musculoskeletal: Negative for myalgias. Negative for joint pain and falls.  Neurological: Negative for dizziness, tingling, tremors, sensory change, speech change, focal weakness, seizures, loss of consciousness, weakness and headaches.  Endo/Heme/Allergies: Negative for polydipsia. Does not bruise/bleed easily.  Psychiatric/Behavioral:  Negative for depression, suicidal ideas, hallucinations, memory loss and substance abuse. The patient is not nervous/anxious and does not have insomnia.     Blood pressure 125/87, pulse 91, temperature 98.3 F (36.8 C), temperature source Oral, resp. rate 17, height 5\' 1"  (1.549 m), weight 81.647 kg (180 lb).Body mass index is 34.03 kg/(m^2).  General Appearance: dressed and fairly groomed Behavior: Focused on medication and having a pill for each problem.  Eye Contact::  Good  Speech: goal directed and focused on meds, and incongruent in statements  Volume:  Normal  Mood:  Anxious and Depressed  Affect:  Congruent  Thought Process:  Goal Directed  Orientation:  Full (Time, Place, and Person)  Thought Content:  Patient reports the voices are about the same, but does not appear to be responding to internal stimulation today.   Suicidal Thoughts:  No  Homicidal Thoughts:  No  Memory:  Immediate;   Fair  Judgement:  Impaired  Insight:  Lacking  Psychomotor Activity:  Normal  Concentration:  Fair  Recall:  Fair  Akathisia:  No  Handed:    AIMS (if indicated):     Assets:  Housing Social Support  Sleep:  Number of Hours: 5   Current Medications: Current Facility-Administered Medications  Medication Dose Route Frequency Provider Last Rate Last Dose  . acetaminophen (TYLENOL) tablet 650 mg  650 mg Oral Q6H PRN Kerry Hough, PA   650 mg at 05/23/12 2239  . alum & mag hydroxide-simeth (MAALOX/MYLANTA) 200-200-20 MG/5ML suspension 30 mL  30 mL Oral Q4H PRN Kerry Hough, PA      . benztropine (COGENTIN) tablet 1 mg  1 mg Oral BID Mojeed Akintayo   1 mg  at 05/24/12 0759  . calcium carbonate (OS-CAL - dosed in mg of elemental calcium) tablet 500 mg of elemental calcium  1 tablet Oral Q breakfast Mojeed Akintayo   500 mg of elemental calcium at 05/24/12 0759  . cholecalciferol (VITAMIN D) tablet 1,000 Units  1,000 Units Oral Daily Kerry Hough, PA   1,000 Units at 05/24/12 0759  .  gabapentin (NEURONTIN) capsule 300 mg  300 mg Oral TID Mojeed Akintayo   300 mg at 05/24/12 0759  . haloperidol (HALDOL) tablet 10 mg  10 mg Oral BID Mojeed Akintayo   10 mg at 05/24/12 0759  . ibuprofen (ADVIL,MOTRIN) tablet 800 mg  800 mg Oral Q8H PRN Kerry Hough, PA   800 mg at 05/24/12 0229  . lamoTRIgine (LAMICTAL) tablet 200 mg  200 mg Oral QHS Kerry Hough, PA   200 mg at 05/23/12 2238  . magnesium hydroxide (MILK OF MAGNESIA) suspension 30 mL  30 mL Oral Daily PRN Kerry Hough, PA      . norethindrone-ethinyl estradiol (MICROGESTIN,JUNEL,LOESTRIN) 1-20 MG-MCG tablet 1 tablet  1 tablet Oral Daily Kerry Hough, PA   1 tablet at 05/24/12 0801  . traZODone (DESYREL) tablet 50 mg  50 mg Oral QHS,MR X 1 Kerry Hough, PA   50 mg at 05/23/12 2239  . venlafaxine XR (EFFEXOR-XR) 24 hr capsule 300 mg  300 mg Oral Daily Kerry Hough, PA   300 mg at 05/24/12 0759  . zonisamide (ZONEGRAN) capsule 300 mg  300 mg Oral QHS Kerry Hough, PA   300 mg at 05/23/12 2239    Lab Results:  Results for orders placed during the hospital encounter of 05/22/12 (from the past 48 hour(s))  CBC     Status: Abnormal   Collection Time    05/23/12  6:26 AM      Result Value Range   WBC 8.8  4.0 - 10.5 K/uL   RBC 5.11  3.87 - 5.11 MIL/uL   Hemoglobin 13.1  12.0 - 15.0 g/dL   HCT 45.4  09.8 - 11.9 %   MCV 80.2  78.0 - 100.0 fL   MCH 25.6 (*) 26.0 - 34.0 pg   MCHC 32.0  30.0 - 36.0 g/dL   RDW 14.7 (*) 82.9 - 56.2 %   Platelets 294  150 - 400 K/uL  COMPREHENSIVE METABOLIC PANEL     Status: Abnormal   Collection Time    05/23/12  6:26 AM      Result Value Range   Sodium 138  135 - 145 mEq/L   Potassium 3.6  3.5 - 5.1 mEq/L   Chloride 102  96 - 112 mEq/L   CO2 24  19 - 32 mEq/L   Glucose, Bld 85  70 - 99 mg/dL   BUN 16  6 - 23 mg/dL   Creatinine, Ser 1.30  0.50 - 1.10 mg/dL   Calcium 9.3  8.4 - 86.5 mg/dL   Total Protein 7.4  6.0 - 8.3 g/dL   Albumin 3.9  3.5 - 5.2 g/dL   AST 10  0 - 37  U/L   ALT 12  0 - 35 U/L   Alkaline Phosphatase 88  39 - 117 U/L   Total Bilirubin 0.1 (*) 0.3 - 1.2 mg/dL   GFR calc non Af Amer >90  >90 mL/min   GFR calc Af Amer >90  >90 mL/min   Comment:  The eGFR has been calculated     using the CKD EPI equation.     This calculation has not been     validated in all clinical     situations.     eGFR's persistently     <90 mL/min signify     possible Chronic Kidney Disease.  GLUCOSE, CAPILLARY     Status: Abnormal   Collection Time    05/23/12  5:03 PM      Result Value Range   Glucose-Capillary 141 (*) 70 - 99 mg/dL   Comment 1 Notify RN      Physical Findings: AIMS: Facial and Oral Movements Muscles of Facial Expression: None, normal Lips and Perioral Area: None, normal Jaw: None, normal Tongue: None, normal,Extremity Movements Upper (arms, wrists, hands, fingers): None, normal Lower (legs, knees, ankles, toes): None, normal, Trunk Movements Neck, shoulders, hips: None, normal, Overall Severity Severity of abnormal movements (highest score from questions above): None, normal Incapacitation due to abnormal movements: None, normal Patient's awareness of abnormal movements (rate only patient's report): No Awareness, Dental Status Current problems with teeth and/or dentures?: No Does patient usually wear dentures?: No  CIWA:    COWS:     Assessment: 1. Worrisome for poor coping skills. 2. Med seeking for symptoms that are incongruent with her behavior, demeanor and vital signs. 3. Behavior is incongruent for stated chronological age. Treatment Plan Summary: Daily contact with patient to assess and evaluate symptoms and progress in treatment Medication management  Plan: 1. Continue crisis management and stabilization. 2. Medication management to reduce current symptoms to base line and improve patient's overall level of functioning 3. Treat health problems as indicated. 4. Develop treatment plan to decrease risk of  relapse upon discharge and the need for readmission. 5. Psycho-social education regarding relapse prevention and self care. 6. Health care follow up as needed for medical problems. 7. Continue home medications where appropriate. 8. Continue  Naproxyn for menstrual cramps. 9. Will discontinue as much sedating medication as possible. 10. Patient signed a release of information today to request records from [facility]. 11. Patient is to be attending all groups to maximize her therapeutic potential. Medical Decision Making Problem Points:  Established problem, stable/improving (1) and Review of psycho-social stressors (1) Data Points:  Decision to obtain old records (1) Review or order clinical lab tests (1) Review and summation of old records (2) Review of medication regiment & side effects (2)  I certify that inpatient services furnished can reasonably be expected to improve the patient's condition.   Rona Ravens. Korbyn Chopin RPAC 2:51 PM 05/26/2012

## 2012-05-26 NOTE — Progress Notes (Addendum)
Ashlee Day is seen OOB UAL on the 400 hall. SHe is pleasant. SHe does not initiate conversation and she says ( after taking her AM meds ) that she  Just wants to go  back to sleep in her room. SHe takes her AM meds. SHe completes her AM self inventory and on it she writes she denies SI, she rates her depression and hopelessness "4 / 9 " and states her DC plan is to " try harder".    A She spoke with her treatment team this AM. She  Has avoided attending her groups  And Dr. Janina Mayo addressed this noncompliance with her during the team meeting this morning.Staff are to encourage her to attend her groups, interact with staff and other patients and stay out of her room.   R Safety is maintained and POC connt.

## 2012-05-26 NOTE — Progress Notes (Signed)
[facility] LCSW Aftercare Discharge Planning Group Note  05/26/2012 11:01 AM  Participation Quality:  Invited, but did not attend    [place] B 05/26/2012, 11:01 AM

## 2012-05-27 MED ORDER — HYDROXYZINE HCL 50 MG PO TABS
50.0000 mg | ORAL_TABLET | Freq: Every evening | ORAL | Status: DC | PRN
  Administered 2012-05-27 – 2012-05-29 (×3): 50 mg via ORAL
  Filled 2012-05-27: qty 1

## 2012-05-27 MED ORDER — HALOPERIDOL 5 MG PO TABS
10.0000 mg | ORAL_TABLET | Freq: Three times a day (TID) | ORAL | Status: DC
  Administered 2012-05-27 – 2012-05-30 (×9): 10 mg via ORAL
  Filled 2012-05-27 (×5): qty 2
  Filled 2012-05-27: qty 42
  Filled 2012-05-27 (×4): qty 2
  Filled 2012-05-27: qty 42
  Filled 2012-05-27 (×3): qty 2
  Filled 2012-05-27: qty 42

## 2012-05-27 MED ORDER — HALOPERIDOL 5 MG PO TABS
10.0000 mg | ORAL_TABLET | Freq: Three times a day (TID) | ORAL | Status: DC
  Filled 2012-05-27 (×2): qty 2

## 2012-05-27 NOTE — Progress Notes (Signed)
The focus of this group is to help patients review their daily goal of treatment and discuss progress on daily workbooks. Pt attended the evening group and responded minimally to discussion prompts. Pt reported that she had a good day, but would not expand upon that answer. Pt said she was only concerned tonight with getting her medications and getting some sleep. Pt stated that hallway voices/noises sometimes kept her up at night, to which the Writer offered her earplugs. Pt accepted these. Pt avoided eye contact during group and spoke very softly. Pt's affect appeared flat and Pt did not seem engaged.

## 2012-05-27 NOTE — Clinical Social Work Note (Signed)
[facility] Group Notes: (Clinical Social Work)   05/27/2012      Type of Therapy:  Group Therapy   Participation Level:  Did Not Attend    Ambrose Mantle, LCSW 05/27/2012, 12:59 PM

## 2012-05-27 NOTE — Progress Notes (Signed)
Patient ID: Ashlee Day, female   DOB: [birth year], 27 y.o.   MRN: [id number] D. The patient spent most of the evening resting in bed wit eyes closed. She has a sad affect and depressed mood. States she always hears voices, although denies any at present. Denied suicidal ideation.  A. Met with patient 1:1. Reviewed schedule and encouraged to attend evening group. Reviewed medication and administered HS medications. R. The patient did not attend evening group. Compliant with medication. 15 minute checks maintained for safety.

## 2012-05-27 NOTE — Progress Notes (Signed)
Adult Psychoeducational Group Note  Date:  05/27/2012 Time:  2000  Group Topic/Focus:  Wrap-Up Group:   The focus of this group is to help patients review their daily goal of treatment and discuss progress on daily workbooks.  Participation Level:  Active  Participation Quality:  Appropriate  Affect:  Appropriate  Cognitive:  Appropriate  Insight: Good  Engagement in Group:  Engaged  Modes of Intervention:  Discussion  Additional Comments:  Pt participated in wrap-up group this evening. Pt described her day as being "okay". Pt shared that her parents came to visit today. Pt stated that "the voice" has decreased. Pt stated that she didn't sleep much last night and would like to have some sleep medication to help her sleep better.   Ashlee Day A 05/27/2012, 4:31 AM

## 2012-05-27 NOTE — Progress Notes (Signed)
Patient ID: Ashlee Day, female   DOB: 10/02/1985, 27 y.o.   MRN: [id number] [facility] MD Progress Note  Subjective:  Patient seen in.  Patient did not have a good night sleep.  She continued to endorse hallucination and paranoid thinking.  She continued to endorse suicidal thinking without any plan.  She is wondering if medication can be adjusted for her sleep.  She has physical symptoms which are chronic.  She denies any side effects of medication.  She is not a management problem and does attend some groups in the unit.    Objective: Remains withdrawn , isolated and minimal involvement in the groups.  Continued to endorse hallucination and paranoid thinking.  Continued to endorse suicidal thoughts but no plan.    Diagnosis:  MDD severe recurrent with psychotic features  ADL's:  Intact  Sleep:  "All night."  Appetite:  ok  Suicidal Ideation:  Yes but no plan.   Homicidal Ideation:  denies AEB (as evidenced by):the patient's report of symptom reduction, affect, behaviors and ability to process in unit programming.  Psychiatric Specialty Exam: Review of Systems  Constitutional: Negative.  Negative for fever, chills, weight loss, malaise/fatigue and diaphoresis.  HENT: Negative for congestion and sore throat.   Eyes: Negative for blurred vision, double vision and photophobia.  Respiratory: Negative for cough, shortness of breath and wheezing.   Cardiovascular: Negative for chest pain, palpitations and PND.  Gastrointestinal: Negative for heartburn, nausea, vomiting, abdominal pain, diarrhea and constipation.  Musculoskeletal: Negative for myalgias. Negative for joint pain and falls.  Neurological: Negative for dizziness, tingling, tremors, sensory change, speech change, focal weakness, seizures, loss of consciousness, weakness and headaches.  Endo/Heme/Allergies: Negative for polydipsia. Does not bruise/bleed easily.  Psychiatric/Behavioral: Please see M.D. notes.       Blood pressure  125/87, pulse 91, temperature 98.3 F (36.8 C), temperature source Oral, resp. rate 17, height 5\' 1"  (1.549 m), weight 81.647 kg (180 lb).Body mass index is 34.03 kg/(m^2).  General Appearance: dressed and fairly groomed Behavior: Focused on medication and having a pill for each problem.  Eye Contact::  Good  Speech: goal directed and focused on meds, and incongruent in statements  Volume:  Normal  Mood:  Anxious and Depressed  Affect:  Congruent  Thought Process:  Goal Directed  Orientation:  Full (Time, Place, and Person)  Thought Content:  Patient reports the voices are intense.     Suicidal Thoughts:  Yes but no plan.    Homicidal Thoughts:  No  Memory:  Immediate;   Fair  Judgement:  Impaired  Insight:  Lacking  Psychomotor Activity:  Normal  Concentration:  Fair  Recall:  Fair  Akathisia:  No  Handed:    AIMS (if indicated):     Assets:  Housing Social Support  Sleep:  Number of Hours: 5   Current Medications: Current Facility-Administered Medications  Medication Dose Route Frequency Provider Last Rate Last Dose  . acetaminophen (TYLENOL) tablet 650 mg  650 mg Oral Q6H PRN Kerry Hough, PA   650 mg at 05/23/12 2239  . alum & mag hydroxide-simeth (MAALOX/MYLANTA) 200-200-20 MG/5ML suspension 30 mL  30 mL Oral Q4H PRN Kerry Hough, PA      . benztropine (COGENTIN) tablet 1 mg  1 mg Oral BID Mojeed Akintayo   1 mg at 05/24/12 0759  . calcium carbonate (OS-CAL - dosed in mg of elemental calcium) tablet 500 mg of elemental calcium  1 tablet Oral Q breakfast Mojeed Akintayo  500 mg of elemental calcium at 05/24/12 0759  . cholecalciferol (VITAMIN D) tablet 1,000 Units  1,000 Units Oral Daily Kerry Hough, PA   1,000 Units at 05/24/12 0759  . gabapentin (NEURONTIN) capsule 300 mg  300 mg Oral TID Mojeed Akintayo   300 mg at 05/24/12 0759  . haloperidol (HALDOL) tablet 10 mg  10 mg Oral BID Mojeed Akintayo   10 mg at 05/24/12 0759  . ibuprofen (ADVIL,MOTRIN) tablet 800 mg   800 mg Oral Q8H PRN Kerry Hough, PA   800 mg at 05/24/12 0229  . lamoTRIgine (LAMICTAL) tablet 200 mg  200 mg Oral QHS Kerry Hough, PA   200 mg at 05/23/12 2238  . magnesium hydroxide (MILK OF MAGNESIA) suspension 30 mL  30 mL Oral Daily PRN Kerry Hough, PA      . norethindrone-ethinyl estradiol (MICROGESTIN,JUNEL,LOESTRIN) 1-20 MG-MCG tablet 1 tablet  1 tablet Oral Daily Kerry Hough, PA   1 tablet at 05/24/12 0801  . traZODone (DESYREL) tablet 50 mg  50 mg Oral QHS,MR X 1 Kerry Hough, PA   50 mg at 05/23/12 2239  . venlafaxine XR (EFFEXOR-XR) 24 hr capsule 300 mg  300 mg Oral Daily Kerry Hough, PA   300 mg at 05/24/12 0759  . zonisamide (ZONEGRAN) capsule 300 mg  300 mg Oral QHS Kerry Hough, PA   300 mg at 05/23/12 2239    Lab Results:  Results for orders placed during the hospital encounter of 05/22/12 (from the past 48 hour(s))  CBC     Status: Abnormal   Collection Time    05/23/12  6:26 AM      Result Value Range   WBC 8.8  4.0 - 10.5 K/uL   RBC 5.11  3.87 - 5.11 MIL/uL   Hemoglobin 13.1  12.0 - 15.0 g/dL   HCT 16.1  09.6 - 04.5 %   MCV 80.2  78.0 - 100.0 fL   MCH 25.6 (*) 26.0 - 34.0 pg   MCHC 32.0  30.0 - 36.0 g/dL   RDW 40.9 (*) 81.1 - 91.4 %   Platelets 294  150 - 400 K/uL  COMPREHENSIVE METABOLIC PANEL     Status: Abnormal   Collection Time    05/23/12  6:26 AM      Result Value Range   Sodium 138  135 - 145 mEq/L   Potassium 3.6  3.5 - 5.1 mEq/L   Chloride 102  96 - 112 mEq/L   CO2 24  19 - 32 mEq/L   Glucose, Bld 85  70 - 99 mg/dL   BUN 16  6 - 23 mg/dL   Creatinine, Ser 7.82  0.50 - 1.10 mg/dL   Calcium 9.3  8.4 - 95.6 mg/dL   Total Protein 7.4  6.0 - 8.3 g/dL   Albumin 3.9  3.5 - 5.2 g/dL   AST 10  0 - 37 U/L   ALT 12  0 - 35 U/L   Alkaline Phosphatase 88  39 - 117 U/L   Total Bilirubin 0.1 (*) 0.3 - 1.2 mg/dL   GFR calc non Af Amer >90  >90 mL/min   GFR calc Af Amer >90  >90 mL/min   Comment:            The eGFR has been  calculated     using the CKD EPI equation.     This calculation has not been     validated in  all clinical     situations.     eGFR's persistently     <90 mL/min signify     possible Chronic Kidney Disease.  GLUCOSE, CAPILLARY     Status: Abnormal   Collection Time    05/23/12  5:03 PM      Result Value Range   Glucose-Capillary 141 (*) 70 - 99 mg/dL   Comment 1 Notify RN      Physical Findings: AIMS: Facial and Oral Movements Muscles of Facial Expression: None, normal Lips and Perioral Area: None, normal Jaw: None, normal Tongue: None, normal,Extremity Movements Upper (arms, wrists, hands, fingers): None, normal Lower (legs, knees, ankles, toes): None, normal, Trunk Movements Neck, shoulders, hips: None, normal, Overall Severity Severity of abnormal movements (highest score from questions above): None, normal Incapacitation due to abnormal movements: None, normal Patient's awareness of abnormal movements (rate only patient's report): No Awareness, Dental Status Current problems with teeth and/or dentures?: No Does patient usually wear dentures?: No  CIWA:    COWS:     Assessment: 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  Treatment Plan  Daily contact with patient to assess and evaluate symptoms and progress in treatment Medication management. Continue crisis management and stabilization to reduce current symptoms to baseline and improved patient's overall level of functioning. Take physical health problem as needed. Continue to go to treatment plan to reduce risk of relapse upon discharge and needed readmission. Continue psychosocial education and relapse prevention in self-care. To consider Vistaril 25-50 mg for insomnia.  Risk and benefits of the medication explain. Continue to monitor progress. Continue to encourage to participate in group milieu therapy.  Medical Decision Making Problem Points:  Established problem, stable/improving (1) and Review  of psycho-social stressors (1) Data Points:  Decision to obtain old records (1) Review or order clinical lab tests (1) Review and summation of old records (2) Review of medication regiment & side effects (2)  I certify that inpatient services furnished can reasonably be expected to improve the patient's condition.   Kathryne Sharper, MD 11:02 AM 05/27/2012

## 2012-05-27 NOTE — Progress Notes (Signed)
Ashlee Day remains sad, flat and depressed. WHile she will come out of her room to get her meds, to get her mealls, etc...she does not like to stay out of her room, spends most of her time lying in her bed, despite direction and encouragement from this nurse as well as her physician. She is quite focused on her somatic issues and getting her meds.   A She did not complete her self  Inventory as requested. She states her " voices" that she hears are worse at night. She contracts with this nurse for safety.   R Vistaril 50 is ordered for her c/o insomnia and haldol is increased to 10 mg tid ( from bid) for her c/o  Of auditory halucinations.POC contd

## 2012-05-28 MED ORDER — BENZOCAINE 10 % MT GEL
Freq: Four times a day (QID) | OROMUCOSAL | Status: DC | PRN
  Administered 2012-05-29 (×2): via OROMUCOSAL
  Filled 2012-05-28: qty 9.4

## 2012-05-28 MED ORDER — IBUPROFEN 600 MG PO TABS
600.0000 mg | ORAL_TABLET | Freq: Four times a day (QID) | ORAL | Status: DC | PRN
  Administered 2012-05-28 – 2012-05-30 (×4): 600 mg via ORAL
  Filled 2012-05-28 (×4): qty 1

## 2012-05-28 NOTE — Clinical Social Work Note (Signed)
[facility] Group Notes:  (Clinical Social Work)  05/28/2012   11:15-11:45AM  Summary of Progress/Problems:  The main focus of today's process group was to listen to a variety of genres of music and to identify that different types of music provoke different responses.  The patient then was able to identify personally what was soothing for them, as well as energizing.  Handouts were used to record feelings evoked, as well as how patient can personally use this knowledge in sleep habits, with depression, and with other symptoms.  The patient expressed full understanding of the content of group, and participated in the exercise, listening to the music, writing down her feelings, and sharing with the group.  Her affect remained flat throughout the entire process.  Type of Therapy:  Music Therapy with processing done  Participation Level:  Active  Participation Quality:  Attentive and Sharing  Affect:  Depressed and Flat  Cognitive:  Oriented  Insight:  Engaged  Engagement in Therapy:  Engaged  Modes of Intervention:   Socialization, Teacher, English as a foreign language, Exploration, Education, [employer] [place]   [place], LCSW 05/28/2012, 12:27 PM

## 2012-05-28 NOTE — Progress Notes (Signed)
D:Pt reports that voices are decreasing and that they tell her that she is ugly. She denies visual hallucinations at this time. Pt denies si and hi.  A:Offered support and 15 minute checks. Gave medications as ordered and pt is tolerating well. R:Safety maintained on the unit.

## 2012-05-28 NOTE — Progress Notes (Signed)
Patient ID: [name], female   DOB: 1985/11/28, 27 y.o.   MRN: [id number] [facility] MD Progress Note  Subjective:   Patient seen and chart reviewed.  Patient was given Vistaril yesterday and she was able to sleep some hours.  She still hear voices but intensity has been decreased.  She still endorse suicidal thinking but no plan.  She denies any side effects of medication.   Objective: She is cooperative and maintained fair eye contact.  He still requires excessive encouragement to participate in group therapy.  She continued to endorse hallucination, paranoid thinking and passive suicidal thinking but no plan.  There were no tremors or shakes.    Diagnosis:  MDD severe recurrent with psychotic features  ADL's:  Intact  Sleep:  "All night."  Appetite:  ok  Suicidal Ideation:  Yes but no plan.   Homicidal Ideation:  denies AEB (as evidenced by):the patient's report of symptom reduction, affect, behaviors and ability to process in unit programming.  Psychiatric Specialty Exam: Review of Systems  Constitutional: Negative.  Negative for fever, chills, weight loss, malaise/fatigue and diaphoresis.  HENT: Negative for congestion and sore throat.   Eyes: Negative for blurred vision, double vision and photophobia.  Respiratory: Negative for cough, shortness of breath and wheezing.   Cardiovascular: Negative for chest pain, palpitations and PND.  Gastrointestinal: Negative for heartburn, nausea, vomiting, abdominal pain, diarrhea and constipation.  Musculoskeletal: Negative for myalgias. Negative for joint pain and falls.  Neurological: Negative for dizziness, tingling, tremors, sensory change, speech change, focal weakness, seizures, loss of consciousness, weakness and headaches.  Endo/Heme/Allergies: Negative for polydipsia. Does not bruise/bleed easily.  Psychiatric/Behavioral: Please see M.D. notes.       Blood pressure 125/87, pulse 91, temperature 98.3 F (36.8 C), temperature source  Oral, resp. rate 17, height 5\' 1"  (1.549 m), weight 81.647 kg (180 lb).Body mass index is 34.03 kg/(m^2).  General Appearance: dressed and fairly groomed Behavior: Focused on medication and having a pill for each problem.  Eye Contact::  Good  Speech: goal directed and focused on meds, and incongruent in statements  Volume:  Normal  Mood:  Anxious and Depressed  Affect:  Congruent  Thought Process:  Goal Directed  Orientation:  Full (Time, Place, and Person)  Thought Content:  Patient reports the voices are intense.     Suicidal Thoughts:  Yes but no plan.    Homicidal Thoughts:  No  Memory:  Immediate;   Fair  Judgement:  Impaired  Insight:  Lacking  Psychomotor Activity:  Normal  Concentration:  Fair  Recall:  Fair  Akathisia:  No  Handed:    AIMS (if indicated):     Assets:  Housing Social Support  Sleep:  Number of Hours: 5   Current Medications: Current Facility-Administered Medications  Medication Dose Route Frequency Provider Last Rate Last Dose  . acetaminophen (TYLENOL) tablet 650 mg  650 mg Oral Q6H PRN Kerry Hough, PA   650 mg at 05/23/12 2239  . alum & mag hydroxide-simeth (MAALOX/MYLANTA) 200-200-20 MG/5ML suspension 30 mL  30 mL Oral Q4H PRN Kerry Hough, PA      . benztropine (COGENTIN) tablet 1 mg  1 mg Oral BID Mojeed Akintayo   1 mg at 05/24/12 0759  . calcium carbonate (OS-CAL - dosed in mg of elemental calcium) tablet 500 mg of elemental calcium  1 tablet Oral Q breakfast Mojeed Akintayo   500 mg of elemental calcium at 05/24/12 0759  . cholecalciferol (VITAMIN  D) tablet 1,000 Units  1,000 Units Oral Daily Kerry Hough, PA   1,000 Units at 05/24/12 0759  . gabapentin (NEURONTIN) capsule 300 mg  300 mg Oral TID Mojeed Akintayo   300 mg at 05/24/12 0759  . haloperidol (HALDOL) tablet 10 mg  10 mg Oral BID Mojeed Akintayo   10 mg at 05/24/12 0759  . ibuprofen (ADVIL,MOTRIN) tablet 800 mg  800 mg Oral Q8H PRN Kerry Hough, PA   800 mg at 05/24/12 0229   . lamoTRIgine (LAMICTAL) tablet 200 mg  200 mg Oral QHS Kerry Hough, PA   200 mg at 05/23/12 2238  . magnesium hydroxide (MILK OF MAGNESIA) suspension 30 mL  30 mL Oral Daily PRN Kerry Hough, PA      . norethindrone-ethinyl estradiol (MICROGESTIN,JUNEL,LOESTRIN) 1-20 MG-MCG tablet 1 tablet  1 tablet Oral Daily Kerry Hough, PA   1 tablet at 05/24/12 0801  . traZODone (DESYREL) tablet 50 mg  50 mg Oral QHS,MR X 1 Kerry Hough, PA   50 mg at 05/23/12 2239  . venlafaxine XR (EFFEXOR-XR) 24 hr capsule 300 mg  300 mg Oral Daily Kerry Hough, PA   300 mg at 05/24/12 0759  . zonisamide (ZONEGRAN) capsule 300 mg  300 mg Oral QHS Kerry Hough, PA   300 mg at 05/23/12 2239    Lab Results:  Results for orders placed during the hospital encounter of 05/22/12 (from the past 48 hour(s))  CBC     Status: Abnormal   Collection Time    05/23/12  6:26 AM      Result Value Range   WBC 8.8  4.0 - 10.5 K/uL   RBC 5.11  3.87 - 5.11 MIL/uL   Hemoglobin 13.1  12.0 - 15.0 g/dL   HCT 16.1  09.6 - 04.5 %   MCV 80.2  78.0 - 100.0 fL   MCH 25.6 (*) 26.0 - 34.0 pg   MCHC 32.0  30.0 - 36.0 g/dL   RDW 40.9 (*) 81.1 - 91.4 %   Platelets 294  150 - 400 K/uL  COMPREHENSIVE METABOLIC PANEL     Status: Abnormal   Collection Time    05/23/12  6:26 AM      Result Value Range   Sodium 138  135 - 145 mEq/L   Potassium 3.6  3.5 - 5.1 mEq/L   Chloride 102  96 - 112 mEq/L   CO2 24  19 - 32 mEq/L   Glucose, Bld 85  70 - 99 mg/dL   BUN 16  6 - 23 mg/dL   Creatinine, Ser 7.82  0.50 - 1.10 mg/dL   Calcium 9.3  8.4 - 95.6 mg/dL   Total Protein 7.4  6.0 - 8.3 g/dL   Albumin 3.9  3.5 - 5.2 g/dL   AST 10  0 - 37 U/L   ALT 12  0 - 35 U/L   Alkaline Phosphatase 88  39 - 117 U/L   Total Bilirubin 0.1 (*) 0.3 - 1.2 mg/dL   GFR calc non Af Amer >90  >90 mL/min   GFR calc Af Amer >90  >90 mL/min   Comment:            The eGFR has been calculated     using the CKD EPI equation.     This calculation has not  been     validated in all clinical     situations.     eGFR's  persistently     <90 mL/min signify     possible Chronic Kidney Disease.  GLUCOSE, CAPILLARY     Status: Abnormal   Collection Time    05/23/12  5:03 PM      Result Value Range   Glucose-Capillary 141 (*) 70 - 99 mg/dL   Comment 1 Notify RN      Physical Findings: AIMS: Facial and Oral Movements Muscles of Facial Expression: None, normal Lips and Perioral Area: None, normal Jaw: None, normal Tongue: None, normal,Extremity Movements Upper (arms, wrists, hands, fingers): None, normal Lower (legs, knees, ankles, toes): None, normal, Trunk Movements Neck, shoulders, hips: None, normal, Overall Severity Severity of abnormal movements (highest score from questions above): None, normal Incapacitation due to abnormal movements: None, normal Patient's awareness of abnormal movements (rate only patient's report): No Awareness, Dental Status Current problems with teeth and/or dentures?: No Does patient usually wear dentures?: No  CIWA:    COWS:     Assessment: 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  Treatment Plan  Daily contact with patient to assess and evaluate symptoms and progress in treatment Medication management. Continue crisis management and stabilization to reduce current symptoms to baseline and improved patient's overall level of functioning. Take physical health problem as needed. Continue to go to treatment plan to reduce risk of relapse upon discharge and needed readmission. Continue psychosocial education and relapse prevention in self-care. Continue Vistaril 50 mg for insomnia. Continue to monitor progress. Continue to encourage to participate in group milieu therapy.  Medical Decision Making Problem Points:  Established problem, stable/improving (1) and Review of psycho-social stressors (1) Data Points:  Decision to obtain old records (1) Review or order clinical lab tests  (1) Review and summation of old records (2) Review of medication regiment & side effects (2)  I certify that inpatient services furnished can reasonably be expected to improve the patient's condition.   Kathryne Sharper, MD 10:02 AM 05/28/2012

## 2012-05-29 MED ORDER — AZITHROMYCIN 500 MG PO TABS
500.0000 mg | ORAL_TABLET | Freq: Every day | ORAL | Status: AC
  Administered 2012-05-29: 500 mg via ORAL
  Filled 2012-05-29: qty 2
  Filled 2012-05-29: qty 1

## 2012-05-29 MED ORDER — AZITHROMYCIN 250 MG PO TABS
250.0000 mg | ORAL_TABLET | Freq: Every day | ORAL | Status: DC
  Administered 2012-05-30: 250 mg via ORAL
  Filled 2012-05-29 (×2): qty 1

## 2012-05-29 NOTE — Progress Notes (Signed)
Patient ID: Ashlee Day, female   DOB: 13-Jul-1985, 27 y.o.   MRN: [id number] [facility] MD Progress Note  05/25/2012 3:30 PM MICHELA HERST  MRN:  [id number]  Subjective:  Patient states she is upset that she is not going home. She has not attended groups over the weekend. She is demanding xanax or ativan to help her calm down. Lilu states that she was told she was going home today on Friday.  Objective:  Patient is demanding to go home, as well as medication for her "nerves" which are "torn up"  She is given information regarding her current medications and her treatment for anxiety. She will be discharged home tomorrow.  Diagnosis:  MDD severe recurrent with psychotic features  ADL's:  Intact  Sleep:  "All night."  Appetite:  ok  Suicidal Ideation:  denies Homicidal Ideation:  denies AEB (as evidenced by):the patient's report of symptom reduction, affect, behaviors and ability to process in unit programming.  Psychiatric Specialty Exam: Review of Systems  Constitutional: Negative.  Negative for fever, chills, weight loss, malaise/fatigue and diaphoresis.  HENT: Negative for congestion and sore throat.   Eyes: Negative for blurred vision, double vision and photophobia.  Respiratory: Negative for cough, shortness of breath and wheezing.   Cardiovascular: Negative for chest pain, palpitations and PND.  Gastrointestinal: Negative for heartburn, nausea, vomiting, abdominal pain, diarrhea and constipation.  Musculoskeletal: Negative for myalgias. Negative for joint pain and falls.  Neurological: Negative for dizziness, tingling, tremors, sensory change, speech change, focal weakness, seizures, loss of consciousness, weakness and headaches.  Endo/Heme/Allergies: Negative for polydipsia. Does not bruise/bleed easily.  Psychiatric/Behavioral: Negative for depression, suicidal ideas, hallucinations, memory loss and substance abuse. The patient is not nervous/anxious and does not have insomnia.      Blood pressure 125/87, pulse 91, temperature 98.3 F (36.8 C), temperature source Oral, resp. rate 17, height 5\' 1"  (1.549 m), weight 81.647 kg (180 lb).Body mass index is 34.03 kg/(m^2).  General Appearance: dressed and fairly groomed Behavior: Focused on medication and having a pill for each problem.  Eye Contact::  Good  Speech: goal directed and focused on meds, and incongruent in statements  Volume:  Normal  Mood:  Anxious and Depressed  Affect:  Congruent  Thought Process:  Goal Directed  Orientation:  Full (Time, Place, and Person)  Thought Content:  Patient reports the voices are gone   Suicidal Thoughts:  No  Homicidal Thoughts:  No  Memory:  Immediate;   Fair  Judgement:  Impaired  Insight:  Lacking  Psychomotor Activity:  Normal  Concentration:  Fair  Recall:  Fair  Akathisia:  No  Handed:    AIMS (if indicated):     Assets:  Housing Social Support  Sleep:  Number of Hours: 5   Current Medications: Current Facility-Administered Medications  Medication Dose Route Frequency Provider Last Rate Last Dose  . acetaminophen (TYLENOL) tablet 650 mg  650 mg Oral Q6H PRN Kerry Hough, PA   650 mg at 05/23/12 2239  . alum & mag hydroxide-simeth (MAALOX/MYLANTA) 200-200-20 MG/5ML suspension 30 mL  30 mL Oral Q4H PRN Kerry Hough, PA      . benztropine (COGENTIN) tablet 1 mg  1 mg Oral BID Mojeed Akintayo   1 mg at 05/24/12 0759  . calcium carbonate (OS-CAL - dosed in mg of elemental calcium) tablet 500 mg of elemental calcium  1 tablet Oral Q breakfast Mojeed Akintayo   500 mg of elemental calcium at 05/24/12 0759  .  cholecalciferol (VITAMIN D) tablet 1,000 Units  1,000 Units Oral Daily Kerry Hough, PA   1,000 Units at 05/24/12 0759  . gabapentin (NEURONTIN) capsule 300 mg  300 mg Oral TID Mojeed Akintayo   300 mg at 05/24/12 0759  . haloperidol (HALDOL) tablet 10 mg  10 mg Oral BID Mojeed Akintayo   10 mg at 05/24/12 0759  . ibuprofen (ADVIL,MOTRIN) tablet 800 mg   800 mg Oral Q8H PRN Kerry Hough, PA   800 mg at 05/24/12 0229  . lamoTRIgine (LAMICTAL) tablet 200 mg  200 mg Oral QHS Kerry Hough, PA   200 mg at 05/23/12 2238  . magnesium hydroxide (MILK OF MAGNESIA) suspension 30 mL  30 mL Oral Daily PRN Kerry Hough, PA      . norethindrone-ethinyl estradiol (MICROGESTIN,JUNEL,LOESTRIN) 1-20 MG-MCG tablet 1 tablet  1 tablet Oral Daily Kerry Hough, PA   1 tablet at 05/24/12 0801  . traZODone (DESYREL) tablet 50 mg  50 mg Oral QHS,MR X 1 Kerry Hough, PA   50 mg at 05/23/12 2239  . venlafaxine XR (EFFEXOR-XR) 24 hr capsule 300 mg  300 mg Oral Daily Kerry Hough, PA   300 mg at 05/24/12 0759  . zonisamide (ZONEGRAN) capsule 300 mg  300 mg Oral QHS Kerry Hough, PA   300 mg at 05/23/12 2239    Lab Results:  Results for orders placed during the hospital encounter of 05/22/12 (from the past 48 hour(s))  CBC     Status: Abnormal   Collection Time    05/23/12  6:26 AM      Result Value Range   WBC 8.8  4.0 - 10.5 K/uL   RBC 5.11  3.87 - 5.11 MIL/uL   Hemoglobin 13.1  12.0 - 15.0 g/dL   HCT 16.1  09.6 - 04.5 %   MCV 80.2  78.0 - 100.0 fL   MCH 25.6 (*) 26.0 - 34.0 pg   MCHC 32.0  30.0 - 36.0 g/dL   RDW 40.9 (*) 81.1 - 91.4 %   Platelets 294  150 - 400 K/uL  COMPREHENSIVE METABOLIC PANEL     Status: Abnormal   Collection Time    05/23/12  6:26 AM      Result Value Range   Sodium 138  135 - 145 mEq/L   Potassium 3.6  3.5 - 5.1 mEq/L   Chloride 102  96 - 112 mEq/L   CO2 24  19 - 32 mEq/L   Glucose, Bld 85  70 - 99 mg/dL   BUN 16  6 - 23 mg/dL   Creatinine, Ser 7.82  0.50 - 1.10 mg/dL   Calcium 9.3  8.4 - 95.6 mg/dL   Total Protein 7.4  6.0 - 8.3 g/dL   Albumin 3.9  3.5 - 5.2 g/dL   AST 10  0 - 37 U/L   ALT 12  0 - 35 U/L   Alkaline Phosphatase 88  39 - 117 U/L   Total Bilirubin 0.1 (*) 0.3 - 1.2 mg/dL   GFR calc non Af Amer >90  >90 mL/min   GFR calc Af Amer >90  >90 mL/min   Comment:            The eGFR has been  calculated     using the CKD EPI equation.     This calculation has not been     validated in all clinical     situations.  eGFR's persistently     <90 mL/min signify     possible Chronic Kidney Disease.  GLUCOSE, CAPILLARY     Status: Abnormal   Collection Time    05/23/12  5:03 PM      Result Value Range   Glucose-Capillary 141 (*) 70 - 99 mg/dL   Comment 1 Notify RN      Physical Findings: AIMS: Facial and Oral Movements Muscles of Facial Expression: None, normal Lips and Perioral Area: None, normal Jaw: None, normal Tongue: None, normal,Extremity Movements Upper (arms, wrists, hands, fingers): None, normal Lower (legs, knees, ankles, toes): None, normal, Trunk Movements Neck, shoulders, hips: None, normal, Overall Severity Severity of abnormal movements (highest score from questions above): None, normal Incapacitation due to abnormal movements: None, normal Patient's awareness of abnormal movements (rate only patient's report): No Awareness, Dental Status Current problems with teeth and/or dentures?: No Does patient usually wear dentures?: No  CIWA:    COWS:     Assessment: 1. Worrisome for poor coping skills. 2. Med seeking for symptoms that are incongruent with her behavior, demeanor and vital signs. 3. Behavior is incongruent for stated chronological age. Treatment Plan Summary: Daily contact with patient to assess and evaluate symptoms and progress in treatment Medication management  Plan: 1. Continue crisis management and stabilization. 2. Medication management to reduce current symptoms to base line and improve patient's overall level of functioning 3. Treat health problems as indicated. 4. Develop treatment plan to decrease risk of relapse upon discharge and the need for readmission. 5. Psycho-social education regarding relapse prevention and self care. 6. Health care follow up as needed for medical problems. 7. Continue home medications where  appropriate. 8. Continue  Naproxyn for menstrual cramps. 9. Will discontinue as much sedating medication as possible. 10. Patient signed a release of information today to request records from [facility]. 11. Patient is to be attending all groups to maximize her therapeutic potential. 12. Patient to be discharged home tomorrow.  13. CM to contact family. Medical Decision Making Problem Points:  Established problem, stable/improving (1) and Review of psycho-social stressors (1) Data Points:  Decision to obtain old records (1) Review or order clinical lab tests (1) Review and summation of old records (2) Review of medication regiment & side effects (2)  I certify that inpatient services furnished can reasonably be expected to improve the patient's condition.   Rona Ravens. Jaxan Michel RPAC 11:41 AM 05/29/2012

## 2012-05-29 NOTE — Progress Notes (Signed)
Pt endorse AVH, pt reported hearing voices telling her that she is ugly. Pt has depressed affect/mood. Pt has been tearful throughout the day because she is ready to return home. Pt c/o toothache. Pt was given prn pain med and orajel. Pt has minimal interaction on the milieu. Pt continues to seek meds for pain even after receiving scheduled meds. Pt was seen by Clinical research associate yelling at [facility] during consult. Pt had to be redirected by Clinical research associate two times.   Medications administered as ordered per MD. Verbal support given. Pt encouraged to attend groups. 15 minute checks performed for safety.  Pt is depressed. Pt remains safe

## 2012-05-29 NOTE — Progress Notes (Signed)
Recreation Therapy Notes  05/29/2012         Time: 9:30am      Group Topic/Focus: Decision Making  Participation Level: Did not attend  Jearl Klinefelter, LRT/CTRS   Jearl Klinefelter 05/29/2012 12:15 PM

## 2012-05-29 NOTE — [facility] Counselor (Signed)
Adult Comprehensive Assessment  Patient ID: Ashlee Day, female   DOB: 1985/06/10, 27 y.o.   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  Substance abuse: none identified Bereavement / Loss: Grandparents passed away about 3 years ago. Very close with them.   Living/Environment/Situation:  Living Arrangements: Parent (Lives with mother and father) Living conditions (as described by patient or guardian): Loves living with parents. Comfortable  How long has patient lived in current situation?: "i've lived with my parents for my whole life but lived in that house since I was 27." What is atmosphere in current home: Comfortable;Loving;Supportive  Family History:  Marital status: Single Does patient have children?: No  Childhood History:  By whom was/is the patient raised?: Both parents Additional childhood history information: "We've always gotten along good." Description of patient's relationship with caregiver when they were a child: Good relationship  Patient's description of current relationship with people who raised him/her: Still very close to parents.  Does patient have siblings?: No Did patient suffer any verbal/emotional/physical/sexual abuse as a child?: No Did patient suffer from severe childhood neglect?: No Has patient ever been sexually abused/assaulted/raped as an adolescent or adult?: No Was the  patient ever a victim of a crime or a disaster?: No Witnessed domestic violence?: No Has patient been effected by domestic violence as an adult?: No  Education:  Highest grade of school patient has completed: 12th grade- high school graduate Currently a student?: No Learning disability?: No  Employment/Work Situation:   Employment situation: Unemployed Patient's job has been impacted by current illness: No 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 Where was the patient employed at that time?: never employed Has patient ever been in the [employer]?: No  Financial Resources:   Financial resources: Medicare;Medicaid;Receives SSDI Does patient have a representative payee or guardian?: Yes Name of representative payee or guardian: Ashlee Day (pt's mother)  Alcohol/Substance Abuse:   What has been your use of drugs/alcohol within the last 12 months?: no drugs or alcohol reported If attempted suicide, did drugs/alcohol play a role in this?: No Alcohol/Substance Abuse Treatment Hx: Denies past history If yes, describe treatment: n/a Has alcohol/substance abuse ever caused legal problems?: No  Social Support System:   Patient's Community Support System: Good Describe Community Support System: neighbors--"they are older people.Marland Kitchenaround my mom's age." Type of faith/religion: Christian How does patient's faith help to cope with current illness?: pray  Leisure/Recreation:   Leisure and Hobbies: crafts; "God's given me talent to do [employer] and paint."  Strengths/Needs:   What things does the patient do well?: crafts.  In what areas does patient struggle / problems for patient: "I'm nervous when I get around people."   Discharge Plan:   Does patient have access to transportation?: Yes (Mom, Dad, and maternal grandmother.) Will patient be returning to same living situation after discharge?: Yes (returning home to live with parents in  [place].) Currently receiving community mental health services: No If no, would patient like referral for services when discharged?: Yes (  What county?) [facility]) Does patient have financial barriers related to discharge medications?: No (Medicaid)  Summary/Recommendations:   Summary and Recommendations (to be completed by the evaluator): Pt is 27 year old Caucasian female admitted to the hospital due to [facility], inability to sleep, depressive and axiety issues. Pt has history of Major depression with psychotic features and has medical problems that are potentially affecting mental health issues. Recommendations for pt include therapeutic milieu, encouragement of group attendance and participation, medication management for mood stabilization and elimination of AH, and identification of comprehensive  mental wellness plan. pt unsure of who she sees for o/p mental health care.     Daryel Gerald B. 05/29/2012

## 2012-05-29 NOTE — Progress Notes (Signed)
Psychoeducational Group Note  Date:  05/29/2012 Time:  2000  Group Topic/Focus:  Wrap-Up Group:   The focus of this group is to help patients review their daily goal of treatment and discuss progress on daily workbooks.  Participation Level: Did Not Attend  Participation Quality:  Not Applicable  Affect:  Not Applicable  Cognitive:  Not Applicable  Insight:  Not Applicable  Engagement in Group: Not Applicable  Additional Comments:  Pt did not attend the evening group.  Christ Kick 05/29/2012, 10:30 PM

## 2012-05-29 NOTE — Progress Notes (Signed)
[facility] Adult Case Management Discharge Plan :  Will you be returning to the same living situation after discharge: Yes,  home with parents At discharge, do you have transportation home?:Yes,  mother Do you have the ability to pay for your medications:Yes,  MCD  Release of information consent forms completed and in the chart;  Patient's signature needed at discharge.  Patient to Follow up at: Follow-up Information   Follow up with [facility] On 05/31/2012. (2:00 with Jorje Guild)    Contact information:   [address]  [place]  [phone]      Patient denies SI/HI:   Yes,  yes    Safety Planning and Suicide Prevention discussed:  Yes,  yes  Ida Rogue 05/29/2012, 3:10 PM

## 2012-05-29 NOTE — Progress Notes (Signed)
Pt was not satisfied with waiting until tomorrow for treatment for her gum pain.  Her mother called and said pt has called her several times since 1930 to express her fear of losing the teeth she still has.  Consulted with PA who ordered pt to start Z-pac tonight.  Pt is in agreement with this plan.  Initial dose of 500mg  given tonight.  Continue safety checks q15 minutes.

## 2012-05-29 NOTE — Progress Notes (Signed)
Patient ID: Ashlee Day, female   DOB: 1986-03-14, 27 y.o.   MRN: [id number] D. The patient has a flat affect and depressed mood. Admits to auditory hallucinations but states that she mostly just hears whispering or noise. Denied any suicidal ideation. She was happy that her mother visited and brought along a friend she met during her last hospitalization. C/o a toothache. Explained that she has a calcium deficiency and had to have all but her front teeth extracted.  A. Met with patient and her mother. Reviewed medications. Called the [facility] As per the patients request to obtain an order for Ora-Jel and Ibuprofin for relief of tooth pain. Administered medications. R. The patient attended and actively participated in evening group. Compliant with medication. Stated she likes that all her medicine is given on time. This is very important to her.

## 2012-05-29 NOTE — Progress Notes (Signed)
Pt is observed in the hallway talking with staff.  She asks and insists on speaking to this Clinical research associate privately.  In her room, pt expressed her concern about her teeth and feels she needs something more that what is available to her at this time.  Her L cheek is slightly more puffy that the R.  Spoke with PA who deferred treatment until tomorrow as pt is slated to be discharged tomorrow.  Pt was informed of this.  She is being given Ibuprofen and orajel for pain prn.  Pt denies SI/HI/AV.  She says she is still hearing voices, but they are decreasing.  Support/encouragement given.  Pt makes her needs known to staff.  Safety maintained with q15 minute checks.

## 2012-05-30 DIAGNOSIS — F603 Borderline personality disorder: Secondary | ICD-10-CM

## 2012-05-30 MED ORDER — GABAPENTIN 300 MG PO CAPS: 300.0000 mg | ORAL_CAPSULE | Freq: Three times a day (TID) | ORAL | Status: DC

## 2012-05-30 MED ORDER — LAMOTRIGINE 200 MG PO TABS: 200.0000 mg | ORAL_TABLET | Freq: Every day | ORAL | Status: DC

## 2012-05-30 MED ORDER — HALOPERIDOL 10 MG PO TABS: 10.0000 mg | ORAL_TABLET | Freq: Three times a day (TID) | ORAL | Status: DC

## 2012-05-30 MED ORDER — NAPROXEN 500 MG PO TABS: 500.0000 mg | ORAL_TABLET | Freq: Two times a day (BID) | ORAL | Status: DC

## 2012-05-30 MED ORDER — BENZTROPINE MESYLATE 1 MG PO TABS: 1.0000 mg | ORAL_TABLET | Freq: Two times a day (BID) | ORAL | Status: DC

## 2012-05-30 MED ORDER — HYDROXYZINE HCL 50 MG PO TABS: 50.0000 mg | ORAL_TABLET | Freq: Every evening | ORAL | Status: DC | PRN

## 2012-05-30 MED ORDER — ZONISAMIDE 100 MG PO CAPS: 300.0000 mg | ORAL_CAPSULE | Freq: Every day | ORAL | Status: DC

## 2012-05-30 MED ORDER — VENLAFAXINE HCL ER 150 MG PO CP24: 300.0000 mg | ORAL_CAPSULE | Freq: Every day | ORAL | Status: DC

## 2012-05-30 NOTE — Progress Notes (Signed)
Patient ID: Ashlee Day, female   DOB: Jul 14, 1985, 27 y.o.   MRN: [id number] Discharged note: pt discharged to self with her mother present during discharge process. Pt denies SI/HI. Pt received both verbal and written discharge instructions. Pt agreed to f/u appointments and medication regimen. Pt received all belongings from room and locker. Pt and pt mother met with Clinical research associate and PA. During process pt became agitated with PA and her mother. Pt had to be redirected by Clinical research associate. Pt is receptive to after care treatment. Pt safely left [facility] with her mother.

## 2012-05-30 NOTE — [facility] Suicide Risk Assessment (Signed)
Suicide Risk Assessment  Discharge Assessment     Demographic Factors:  Adolescent or young adult, Low socioeconomic status, Unemployed and female  Mental Status Per Nursing Assessment::   On Admission:     Current Mental Status by Physician: patient denies suicidal ideation, intent or plan  Loss Factors: Financial problems/change in socioeconomic status  Historical Factors: NA  Risk Reduction Factors:   Living with another person, especially a relative and Positive social support  Continued Clinical Symptoms:  Resolving depressive symptoms  Cognitive Features That Contribute To Risk:  Closed-mindedness Polarized thinking    Suicide Risk:  Minimal: No identifiable suicidal ideation.  Patients presenting with no risk factors but with morbid ruminations; may be classified as minimal risk based on the severity of the depressive symptoms  Discharge Diagnoses:   AXIS I:  Major depressive disorder, recurrent episode, severe, specified as with psychotic behavior  AXIS II:  Deferred AXIS III:   Past Medical History  Diagnosis Date  . Fibromyalgia   . Ileus     x3-4  . Hashimoto's thyroiditis     2005ish  . Hyperparathyroidism, unspecified   . Kidney stone     2013  . GERD (gastroesophageal reflux disease)   . Migraines     occasional aura  . Partial edentulism   . Torticollis    AXIS IV:  other psychosocial or environmental problems and problems related to social environment AXIS V:  61-70 mild symptoms  Plan Of Care/Follow-up recommendations:  Activity:  as tolerated Diet:  healthy Tests:  routine Other:  patient to keep her after care appointment.  Is patient on multiple antipsychotic therapies at discharge:  No   Has Patient had three or more failed trials of antipsychotic monotherapy by history:  No  Recommended Plan for Multiple Antipsychotic Therapies: N/A  Crescencio Jozwiak,MD 05/30/2012, 10:01 AM
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
## 2012-05-31 ENCOUNTER — Ambulatory Visit (INDEPENDENT_AMBULATORY_CARE_PROVIDER_SITE_OTHER): Payer: No Typology Code available for payment source | Admitting: Physician Assistant

## 2012-05-31 DIAGNOSIS — F333 Major depressive disorder, recurrent, severe with psychotic symptoms: Secondary | ICD-10-CM

## 2012-06-01 NOTE — Discharge Summary (Signed)
Seen and agreed. Anglea Gordner, MD 

## 2012-06-02 NOTE — Progress Notes (Signed)
Patient Discharge Instructions:  Next Level Care Provider Has Access to the EMR, 06/02/12 Records provided to [facility] via [facility]/Epic access.  Jerelene Redden, 06/02/2012, 3:08 PM

## 2012-06-07 ENCOUNTER — Telehealth (HOSPITAL_COMMUNITY): Payer: Self-pay | Admitting: *Deleted

## 2012-06-07 NOTE — Telephone Encounter (Signed)
Pt left VM. Home phone [phone], cell [phone]. Requests call from provider to adjust meds. Not doing so well since d/c from [facility].  Would like appt moved to earlier time, she needs to speak with provider in person

## 2012-06-08 ENCOUNTER — Inpatient Hospital Stay (HOSPITAL_COMMUNITY)
Admission: RE | Admit: 2012-06-08 | Discharge: 2012-06-16 | DRG: 885 | Disposition: A | Discharge status: Home / Self Care | Payer: No Typology Code available for payment source | Attending: Psychiatry | Admitting: Psychiatry

## 2012-06-08 ENCOUNTER — Encounter (HOSPITAL_COMMUNITY): Payer: Self-pay | Admitting: Intensive Care

## 2012-06-08 DIAGNOSIS — F603 Borderline personality disorder: Secondary | ICD-10-CM

## 2012-06-08 DIAGNOSIS — M797 Fibromyalgia: Secondary | ICD-10-CM

## 2012-06-08 DIAGNOSIS — F39 Unspecified mood [affective] disorder: Secondary | ICD-10-CM

## 2012-06-08 DIAGNOSIS — F319 Bipolar disorder, unspecified: Secondary | ICD-10-CM

## 2012-06-08 DIAGNOSIS — E063 Autoimmune thyroiditis: Secondary | ICD-10-CM

## 2012-06-08 DIAGNOSIS — F333 Major depressive disorder, recurrent, severe with psychotic symptoms: Principal | ICD-10-CM

## 2012-06-08 DIAGNOSIS — Z79899 Other long term (current) drug therapy: Secondary | ICD-10-CM

## 2012-06-08 DIAGNOSIS — E213 Hyperparathyroidism, unspecified: Secondary | ICD-10-CM

## 2012-06-08 DIAGNOSIS — F607 Dependent personality disorder: Secondary | ICD-10-CM

## 2012-06-08 DIAGNOSIS — G43909 Migraine, unspecified, not intractable, without status migrainosus: Secondary | ICD-10-CM

## 2012-06-08 DIAGNOSIS — F1910 Other psychoactive substance abuse, uncomplicated: Secondary | ICD-10-CM | POA: Diagnosis present

## 2012-06-08 DIAGNOSIS — K219 Gastro-esophageal reflux disease without esophagitis: Secondary | ICD-10-CM

## 2012-06-08 DIAGNOSIS — Z765 Malingerer [conscious simulation]: Secondary | ICD-10-CM

## 2012-06-08 DIAGNOSIS — F32A Depression, unspecified: Secondary | ICD-10-CM

## 2012-06-08 LAB — COMPREHENSIVE METABOLIC PANEL
ALT: 14 U/L (ref 0–35)
Alkaline Phosphatase: 93 U/L (ref 39–117)
BUN: 15 mg/dL (ref 6–23)
CO2: 23 mEq/L (ref 19–32)
Chloride: 104 mEq/L (ref 96–112)
GFR calc Af Amer: >90 mL/min (ref >90)
Glucose, Bld: 104 mg/dL — ABNORMAL HIGH (ref 70–99)
Potassium: 4.1 mEq/L (ref 3.5–5.1)
Sodium: 139 mEq/L (ref 135–145)
Total Bilirubin: 0.1 mg/dL — ABNORMAL LOW (ref 0.3–1.2)

## 2012-06-08 LAB — URINALYSIS, ROUTINE W REFLEX MICROSCOPIC
Bilirubin Urine: NEGATIVE
Glucose, UA: NEGATIVE mg/dL
Hgb urine dipstick: NEGATIVE
Protein, ur: NEGATIVE mg/dL
Urobilinogen, UA: 0.2 mg/dL (ref 0.0–1.0)

## 2012-06-08 LAB — RAPID URINE DRUG SCREEN, HOSP PERFORMED
Amphetamines: NOT DETECTED
Benzodiazepines: NOT DETECTED
Opiates: NOT DETECTED
Tetrahydrocannabinol: NOT DETECTED

## 2012-06-08 LAB — PREGNANCY, URINE: Preg Test, Ur: NEGATIVE

## 2012-06-08 LAB — CBC
HCT: 38.6 % (ref 36.0–46.0)
Hemoglobin: 12.4 g/dL (ref 12.0–15.0)
RBC: 4.86 MIL/uL (ref 3.87–5.11)
WBC: 7 10*3/uL (ref 4.0–10.5)

## 2012-06-08 LAB — URINE MICROSCOPIC-ADD ON

## 2012-06-08 MED ORDER — HALOPERIDOL 5 MG PO TABS
10.0000 mg | ORAL_TABLET | Freq: Three times a day (TID) | ORAL | Status: DC
  Administered 2012-06-08 – 2012-06-09 (×3): 10 mg via ORAL
  Filled 2012-06-08 (×7): qty 2

## 2012-06-08 MED ORDER — VENLAFAXINE HCL ER 150 MG PO CP24
300.0000 mg | ORAL_CAPSULE | Freq: Every day | ORAL | Status: DC
  Administered 2012-06-09 – 2012-06-16 (×8): 300 mg via ORAL
  Filled 2012-06-08 (×3): qty 2
  Filled 2012-06-08: qty 28
  Filled 2012-06-08 (×7): qty 2
  Filled 2012-06-08: qty 1
  Filled 2012-06-08 (×2): qty 2

## 2012-06-08 MED ORDER — BENZTROPINE MESYLATE 1 MG PO TABS
1.0000 mg | ORAL_TABLET | Freq: Two times a day (BID) | ORAL | Status: DC
  Administered 2012-06-08 – 2012-06-09 (×2): 1 mg via ORAL
  Filled 2012-06-08 (×6): qty 1

## 2012-06-08 MED ORDER — LAMOTRIGINE 100 MG PO TABS
200.0000 mg | ORAL_TABLET | Freq: Every day | ORAL | Status: DC
  Administered 2012-06-08 – 2012-06-15 (×8): 200 mg via ORAL
  Filled 2012-06-08 (×5): qty 2
  Filled 2012-06-08: qty 28
  Filled 2012-06-08 (×7): qty 2

## 2012-06-08 MED ORDER — NAPROXEN 500 MG PO TABS
500.0000 mg | ORAL_TABLET | Freq: Two times a day (BID) | ORAL | Status: DC
  Administered 2012-06-09 – 2012-06-16 (×15): 500 mg via ORAL
  Filled 2012-06-08 (×15): qty 1
  Filled 2012-06-08: qty 28
  Filled 2012-06-08 (×4): qty 1
  Filled 2012-06-08: qty 28
  Filled 2012-06-08: qty 1

## 2012-06-08 MED ORDER — NORETHINDRONE ACET-ETHINYL EST 1-20 MG-MCG PO TABS
1.0000 | ORAL_TABLET | Freq: Every day | ORAL | Status: DC
  Administered 2012-06-11 – 2012-06-16 (×6): 1 via ORAL

## 2012-06-08 MED ORDER — HYDROXYZINE HCL 50 MG PO TABS
50.0000 mg | ORAL_TABLET | Freq: Every evening | ORAL | Status: DC | PRN
  Administered 2012-06-08 – 2012-06-15 (×9): 50 mg via ORAL
  Filled 2012-06-08 (×4): qty 1

## 2012-06-08 MED ORDER — ACETAMINOPHEN 325 MG PO TABS
650.0000 mg | ORAL_TABLET | Freq: Four times a day (QID) | ORAL | Status: DC | PRN
  Administered 2012-06-09 – 2012-06-15 (×2): 650 mg via ORAL

## 2012-06-08 MED ORDER — ALUM & MAG HYDROXIDE-SIMETH 200-200-20 MG/5ML PO SUSP
30.0000 mL | ORAL | Status: DC | PRN
  Administered 2012-06-10: 30 mL via ORAL

## 2012-06-08 MED ORDER — HALOPERIDOL 5 MG PO TABS
5.0000 mg | ORAL_TABLET | Freq: Four times a day (QID) | ORAL | Status: DC | PRN
  Administered 2012-06-08 – 2012-06-16 (×4): 5 mg via ORAL
  Filled 2012-06-08 (×4): qty 1

## 2012-06-08 MED ORDER — MAGNESIUM HYDROXIDE 400 MG/5ML PO SUSP: 30.0000 mL | Freq: Every day | ORAL | Status: DC | PRN

## 2012-06-08 MED ORDER — GABAPENTIN 300 MG PO CAPS
300.0000 mg | ORAL_CAPSULE | Freq: Three times a day (TID) | ORAL | Status: DC
  Administered 2012-06-08 – 2012-06-16 (×24): 300 mg via ORAL
  Filled 2012-06-08 (×9): qty 1
  Filled 2012-06-08: qty 42
  Filled 2012-06-08 (×10): qty 1
  Filled 2012-06-08: qty 42
  Filled 2012-06-08 (×11): qty 1
  Filled 2012-06-08: qty 42
  Filled 2012-06-08: qty 1

## 2012-06-08 NOTE — Tx Team (Signed)
Initial Interdisciplinary Treatment Plan  PATIENT STRENGTHS: (choose at least two) Supportive family/friends  PATIENT STRESSORS: Educational concerns Health problems Marital or family conflict Medication change or noncompliance Occupational concerns   PROBLEM LIST: Problem List/Patient Goals Date to be addressed Date deferred Reason deferred Estimated date of resolution  Auditory Hallucinations/Psychosis 06/08/12                                                      DISCHARGE CRITERIA:  Ability to meet basic life and health needs Adequate post-discharge living arrangements Improved stabilization in mood, thinking, and/or behavior Medical problems require only outpatient monitoring  PRELIMINARY DISCHARGE PLAN: Attend aftercare/continuing care group Outpatient therapy  PATIENT/FAMIILY INVOLVEMENT: This treatment plan has been presented to and reviewed with the patient, Ashlee Day.  The patient has been given the opportunity to ask questions and make suggestions.  Nestor Ramp [facility] 06/08/2012, 5:01 PM

## 2012-06-08 NOTE — BH Assessment (Signed)
Assessment Note   Ashlee Day is an 27 y.o. female, single, white who presents to [facility] accompanied by her mother who participated in assessment at the Pt's request. Pt's mother reports Pt was discharged from [facility] on 05/30/2012 and has not been doing well. Mother reports today Pt became very agitated and upset while in the car in a bank drive through and insisted she wanted to go to a local restaurant to get an alcoholic beverage. Pt does not normally drink alcohol and is on psychiatric medication and mother refused to take her to the restaurant. Pt became extremely upset, got out of the car and began walking down the street looking for the restaurant. Mother had to convince Pt to get back in the car. Mother contacted [facility] and staff recommended they come for assessment.  Pt's mother also reports that Pt had to go the the emergency department at [facility] due to her dental infection. She was placed on intravenous antibiotics but "because upset at how the staff were treating her" and pulled out her IV, "with blood spurting out everywhere." Pt left the ED AMA.   Per Pt she has only slept 5 hours in the past week. When she does sleep she has "violent, gory nightmares." She reports she has extreme anxiety and cannot stay still. She reports symptoms including crying spells, agitation, anger outbursts, impatience, poor concentration, fatigue and feelings of sadness. She reports auditory hallucinations that curse her and tell her she is ugly. She also reports visual hallucinations of "Facebook friends and monkeys" but says these are much less intense than when she was hospitalized. She denies any suicidal ideation or homicidal ideation. She denies physical aggression but Dr. Dub Mikes reports she postured like she was going to strike her mother during his evaluation. She denies any alcohol or substance use.  Pt reports she is very self conscious and concerned about her  teeth. She was asking for "an injection for my anxiety" during the assessment. She is requesting her medications be changed today.  Pt is alert, oriented x4 with normal speech and restless motor behavior. Her thought process was linear and goal directed, although her concentration seemed mildly impaired. Pt's mood was labile and she was at times cooperative, tearful, angry, screaming, sad and sullen. He affect was congruent with her mood.   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 Axis III:  Past Medical History  Diagnosis Date  . Fibromyalgia   . Depression   . Ileus     x3-4  . Hashimoto's thyroiditis     2005ish  . Hyperparathyroidism, unspecified   . Kidney stone     2013  . Bipolar disorder   . GERD (gastroesophageal reflux disease)   . Migraines     occasional aura  . Partial edentulism   . Torticollis    Axis IV: other psychosocial or environmental problems Axis V: GAF=28  Past Medical History:  Past Medical History  Diagnosis Date  . Fibromyalgia   . Depression   . Ileus     x3-4  . Hashimoto's thyroiditis     2005ish  . Hyperparathyroidism, unspecified   . Kidney stone     2013  . Bipolar disorder   . GERD (gastroesophageal reflux disease)   . Migraines     occasional aura  . Partial edentulism   .  Torticollis     Past Surgical History  Procedure Laterality Date  . Appendicitis      2003  . Dilation and curettage of uterus      1999  . Parathyroidectomy      Family History:  Family History  Problem Relation Age of Onset  . Hypertension Father   . Coronary artery disease Paternal Grandfather     smoker  . Cancer Maternal Grandfather     MM  . Diabetes Paternal Grandfather     Medication induced  . Hypertension Maternal Grandmother   . Migraines Mother   . Rheum arthritis Mother     undifferentiated  connective tissue    Social History:  reports that she has never smoked. She does not have any smokeless tobacco history on file. She reports that she does not drink alcohol or use illicit drugs.  Additional Social History:  Alcohol / Drug Use Pain Medications: Denies Prescriptions: Denies Over the Counter: Denies History of alcohol / drug use?: No history of alcohol / drug abuse Longest period of sobriety (when/how long): NA  CIWA: CIWA-Ar BP: 139/104 mmHg Pulse Rate: 99 COWS:    Allergies:  Allergies  Allergen Reactions  . Amoxicillin Other (See Comments)    Blisters all over Cannot take any 'cillins' at all  . Demerol (Meperidine) Nausea And Vomiting    Can not tolerate Iv administration Can tolerate IM adminstration  . Imitrex (Sumatriptan)     Severity unknown  . Penicillins Other (See Comments)    Cannot not tolerate any cillins due to amoxicillin reaction   . Sulfa Antibiotics Nausea And Vomiting  . Depacon (Valproate Sodium) Nausea And Vomiting and Rash  . Depakote (Divalproex Sodium) Nausea And Vomiting and Rash  . Dihydroergotamine Nausea And Vomiting and Rash  . Metoclopramide Nausea And Vomiting and Rash    React to IV form Can tolerate oral medication  . Morphine And Related Rash    Home Medications:  Medications Prior to Admission  Medication Sig Dispense Refill  . benztropine (COGENTIN) 1 MG tablet Take 1 tablet (1 mg total) by mouth 2 (two) times daily.  60 tablet  0  . calcium carbonate (OS-CAL) 600 MG TABS Take 600 mg by mouth daily.      . cholecalciferol (VITAMIN D) 1000 UNITS tablet Take 1,000 Units by mouth daily.      Marland Kitchen gabapentin (NEURONTIN) 300 MG capsule Take 1 capsule (300 mg total) by mouth 3 (three) times daily. For anxiety and panic  90 capsule  0  . haloperidol (HALDOL) 10 MG tablet Take 1 tablet (10 mg total) by mouth 3 (three) times daily. For psychosis and agitation.  90 tablet  0  . hydrOXYzine (ATARAX/VISTARIL) 50 MG tablet Take 1  tablet (50 mg total) by mouth at bedtime as needed (sleep). For insomnia.  30 tablet  0  . lamoTRIgine (LAMICTAL) 200 MG tablet Take 1 tablet (200 mg total) by mouth at bedtime. For mood lability.  60 tablet  0  . naproxen (NAPROSYN) 500 MG tablet Take 1 tablet (500 mg total) by mouth 2 (two) times daily with a meal. For pain.  60 tablet  0  . norethindrone-ethinyl estradiol (MICROGESTIN,JUNEL,LOESTRIN) 1-20 MG-MCG tablet Take 1 tablet by mouth daily.      Marland Kitchen venlafaxine XR (EFFEXOR-XR) 150 MG 24 hr capsule Take 2 capsules (300 mg total) by mouth daily.  60 capsule  0  . zonisamide (ZONEGRAN) 100 MG capsule Take 3 capsules (300 mg total)  by mouth at bedtime. For seizures.        OB/GYN Status:  No LMP recorded. Patient has had an injection.  General Assessment Data Location of Assessment: [facility] Living Arrangements: Parent Can pt return to current living arrangement?: Yes Admission Status: Voluntary Is patient capable of signing voluntary admission?: Yes Transfer from: Home Referral Source: Self/Family/Friend  Education Status Is patient currently in school?: No Current Grade: NA Highest grade of school patient has completed: 12th grade- high school graduate Name of school: NA Contact person: NA  Risk to self Suicidal Ideation: No Suicidal Intent: No Is patient at risk for suicide?: Yes Suicidal Plan?: No Access to Means: No What has been your use of drugs/alcohol within the last 12 months?: Pt denies Previous Attempts/Gestures: No How many times?: 0 Other Self Harm Risks: None Triggers for Past Attempts: None known Intentional Self Injurious Behavior: None Family Suicide History: No Recent stressful life event(s): Other (Comment) (Dental problems) Persecutory voices/beliefs?: Yes Depression: Yes Depression Symptoms: Despondent;Insomnia;Tearfulness;Isolating;Fatigue;Loss of interest in usual pleasures;Guilt;Feeling worthless/self pity;Feeling  angry/irritable Substance abuse history and/or treatment for substance abuse?: No Suicide prevention information given to non-admitted patients: Not applicable  Risk to Others Homicidal Ideation: No Thoughts of Harm to Others: No Current Homicidal Intent: No Current Homicidal Plan: No Access to Homicidal Means: No Identified Victim: None History of harm to others?: No Assessment of Violence: None Noted Violent Behavior Description: None Does patient have access to weapons?: No Criminal Charges Pending?: No Does patient have a court date: No  Psychosis Hallucinations: None noted Delusions: None noted  Mental Status Report Appear/Hygiene: Other (Comment) (WDL) Eye Contact: Good Motor Activity: Restlessness Speech: Logical/coherent;Aggressive Level of Consciousness: Alert Mood: Anxious Affect: Anxious Anxiety Level: Severe Thought Processes: Coherent;Relevant Judgement: Impaired Orientation: Person;Place;Time;Situation Obsessive Compulsive Thoughts/Behaviors: None  Cognitive Functioning Concentration: Normal Memory: Recent Intact;Remote Intact IQ: Average Insight: Fair Impulse Control: Fair Appetite: Fair Weight Loss: 0 Weight Gain: 0 Sleep: Decreased Total Hours of Sleep: 1 (Reports sleeping a total of 5 hours in one week) Vegetative Symptoms: None  ADLScreening [facility]) Patient's cognitive ability adequate to safely complete daily activities?: Yes Patient able to express need for assistance with ADLs?: Yes Independently performs ADLs?: Yes (appropriate for developmental age)  Abuse/Neglect [facility]) Physical Abuse: Denies Verbal Abuse: Denies Sexual Abuse: Denies  Prior Inpatient Therapy Prior Inpatient Therapy: Yes Prior Therapy Dates: Multiple admits Prior Therapy Facilty/Shakiya Mcneary(s): [facility], [facility] Reason for Treatment: psychosis  Prior Outpatient Therapy Prior Outpatient Therapy: Yes Prior Therapy Dates: current  Prior Therapy  Facilty/Zachory Mangual(s): [facility] outpatient Reason for Treatment: psychosis and anxiety  ADL Screening (condition at time of admission) Patient's cognitive ability adequate to safely complete daily activities?: Yes Patient able to express need for assistance with ADLs?: Yes Independently performs ADLs?: Yes (appropriate for developmental age) Weakness of Legs: None Weakness of Arms/Hands: None  Home Assistive Devices/Equipment Home Assistive Devices/Equipment: None  Therapy Consults (therapy consults require a physician order) PT Evaluation Needed: No OT Evalulation Needed: No SLP Evaluation Needed: No Abuse/Neglect Assessment (Assessment to be complete while patient is alone) Physical Abuse: Denies Verbal Abuse: Denies Sexual Abuse: Denies Exploitation of patient/patient's resources: Denies Self-Neglect: Denies Values / Beliefs Cultural Requests During Hospitalization: None Spiritual Requests During Hospitalization: None Consults Spiritual Care Consult Needed: No Social Work Consult Needed: No Merchant navy officer (For Healthcare) Advance Directive: Patient does not have advance directive;Patient would not like information Pre-existing out of facility DNR order (yellow form or pink MOST form): No Nutrition Screen-  [facility] Adult/[facility]/AP Patient's home diet: Regular Have you recently lost weight without trying?: No Have you been eating poorly because of a decreased appetite?: No Malnutrition Screening Tool Score: 0  Additional Information 1:1 In Past 12 Months?: No CIRT Risk: No Elopement Risk: No Does patient have medical clearance?: No     Disposition:  Disposition Initial Assessment Completed: Yes Disposition of Patient: Inpatient treatment program Type of inpatient treatment program: Adult  On Site Evaluation by:   Reviewed with Physician: Jorje Guild, PA & Geoffery Lyons, MD   This [facility] recommended inpatient psychiatric treatment. Pt refused inpatient treatment and said she only  wanted her medications adjusted. Consulted with Jorje Guild, PA, Pt's outpatient Keonte Daubenspeck, who agreed Pt needed inpatient treatment and recommended Pt be involuntarily committed if she refused to sign voluntary consent for treatment. Explained to Pt that Jorje Guild, PA strongly recommended inpatient treatment; Pt continued to refuse. Consulted with Dr. Geoffery Lyons who agreed to evaluate Pt for involuntary commitment. Dr. Dub Mikes did face-to-face evaluation with Pt and decided Pt met criteria for involuntary commitment and began completing IVC paperwork. Pt was very upset, yelling, crying and telling her mother she hated her. Pt then agreed to sign voluntary consent for treatment to avoid IVC. Dr. Dub Mikes agreed Pt could sign inpatient voluntarily. Explained to Pt in detail the voluntary consent for treatment including the 72-hour policy for request for discharge. Pt signed voluntary consent for treatment.  Harlin Rain Patsy Baltimore, [facility], [facility] Assessment Counselor    Pamalee Leyden 06/08/2012 6:04 PM

## 2012-06-08 NOTE — Progress Notes (Signed)
Patient signed the "Request for Discharge" form today at 4; notified PA, Verne Spurr

## 2012-06-08 NOTE — Progress Notes (Signed)
Patient is a walk-in; patient brought by her mother; patient has been having increased auditory hallucinations and states that the voices are telling her that she "isn't pretty"; during admission patient presents as hysterically tearful and states that she "doesn't want to be here"; little information could be obtained from patient because of this; patient given education regarding unit policies and was escorted to unit after being searched; between crying spells, patient is requesting "haldol and ativan" and that she also "wants an injection" to help her "feel better"; patient currently denies SI/HI and visual hallucinations; patient positive for auditory hallucinations and states that the voices are still telling her that she "isn't pretty"; will continue to monitor patient for safety

## 2012-06-09 ENCOUNTER — Encounter (HOSPITAL_COMMUNITY): Payer: Self-pay | Admitting: Psychiatry

## 2012-06-09 DIAGNOSIS — F429 Obsessive-compulsive disorder, unspecified: Secondary | ICD-10-CM

## 2012-06-09 LAB — HEMOGLOBIN A1C: Hgb A1c MFr Bld: 5.4 % (ref <5.7)

## 2012-06-09 MED ORDER — HALOPERIDOL 5 MG PO TABS
5.0000 mg | ORAL_TABLET | Freq: Three times a day (TID) | ORAL | Status: DC
  Administered 2012-06-09 – 2012-06-16 (×20): 5 mg via ORAL
  Filled 2012-06-09 (×13): qty 1
  Filled 2012-06-09: qty 42
  Filled 2012-06-09 (×5): qty 1
  Filled 2012-06-09 (×2): qty 42
  Filled 2012-06-09 (×7): qty 1

## 2012-06-09 MED ORDER — TRIHEXYPHENIDYL HCL 5 MG PO TABS
5.0000 mg | ORAL_TABLET | Freq: Two times a day (BID) | ORAL | Status: DC
  Administered 2012-06-09 – 2012-06-16 (×14): 5 mg via ORAL
  Filled 2012-06-09: qty 28
  Filled 2012-06-09 (×13): qty 1
  Filled 2012-06-09: qty 28
  Filled 2012-06-09 (×5): qty 1

## 2012-06-09 NOTE — Progress Notes (Signed)
Adult Psychosocial Assessment Update Interdisciplinary Team  Previous [facility] admissions/discharges:  Admissions Discharges  Date: 2/17-2/25  Recent DC Date:  Date: Date:  Date: Date:  Date: Date:  Date: Date:   Changes since the last Psychosocial Assessment (including adherence to outpatient mental health and/or substance abuse treatment, situational issues contributing to decompensation and/or relapse). Pt's mother reports Pt was discharged from [facility] on 05/30/2012 and has not been doing well. Mother reports today Pt became very agitated and upset while in the car in a bank drive through and insisted she wanted to go to a local restaurant to get an alcoholic beverage. Pt does not normally drink alcohol and is on psychiatric medication and mother refused to take her to the restaurant. Pt became extremely upset, got out of the car and began walking down the street looking for the restaurant. Mother had to convince Pt to get back in the car.  Per Pt she has only slept 5 hours in the past week. When she does sleep she has "violent, gory nightmares." She reports she has extreme anxiety and cannot stay still. She reports symptoms including crying spells, agitation, anger outbursts, impatience, poor concentration, fatigue and feelings of sadness. She reports auditory hallucinations that curse her and tell her she is ugly. She also reports visual hallucinations of "Facebook friends and monkeys" but says these are much less intense than when she was hospitalized               Discharge Plan 1. Will you be returning to the same living situation after discharge?   Yes: No:      If no, what is your plan?      Yes Home with parents, brought in by her mother.       2. Would you like a referral for services when you are discharged? Yes:     If yes, for what services?  No:       Patient is already set up with Outpatient Follow up with [facility] in [facility]       Summary and  Recommendations (to be completed by the evaluator) Patient admitted back into [facility] 400 hall.  Patient to attend group and medication stablization.                     Signature:  Nail, Catalina Gravel, 06/09/2012 12:24 PM

## 2012-06-09 NOTE — Progress Notes (Signed)
Patient ID: Ashlee Day, female   DOB: 01/21/1986, 27 y.o.   MRN: [id number]  D:  Pt endorses [facility], but says they are not command, they just tell her she is not pretty. Pt denies SI/HI. Pt is very needy and demanding. Pt states she does not want to see PA- Lloyd Huger because they had a confrontation when she was here a week ago.    A: Pt was offered support and encouragement. Pt was given scheduled medications. Pt was encourage to attend groups. Q 15 minute checks were done for safety. Pt given PRN 5mg  Haldol for anxiety and 50 mg Vistaril for sleep.   R:Pt  interacts  with peers and staff. Pt is taking medication.Pt receptive to treatment and safety maintained on unit. Pt states " I need something stronger for sleep, this medication didn't work the last time".

## 2012-06-09 NOTE — Progress Notes (Signed)
Recreation Therapy Notes  Date: 03.07.2014 Time: 9:30am Location: 400 Hall Day Room  Group Topic/Focus: Stress Managment   Participation Level:  Did not attend    [employer], LRT/ CTRS        Jearl Klinefelter 06/09/2012 11:53 AM

## 2012-06-09 NOTE — [facility] Suicide Risk Assessment (Signed)
Suicide Risk Assessment  Admission Assessment     Nursing information obtained from:    Demographic factors:    Current Mental Status:    Loss Factors:    Historical Factors:    Risk Reduction Factors:     CLINICAL FACTORS:   Severe Anxiety and/or Agitation Depression:   Anhedonia Hopelessness Insomnia Obsessive-Compulsive Disorder Currently Psychotic  COGNITIVE FEATURES THAT CONTRIBUTE TO RISK:  Closed-mindedness Polarized thinking    SUICIDE RISK:   Minimal: No identifiable suicidal ideation.  Patients presenting with no risk factors but with morbid ruminations; may be classified as minimal risk based on the severity of the depressive symptoms  PLAN OF CARE:1. Admit for crisis management and stabilization. 2. Medication management to reduce current symptoms to base line and improve the     patient's overall level of functioning 3. Treat health problems as indicated. 4. Develop treatment plan to decrease risk of relapse upon discharge and the need for     readmission. 5. Psycho-social education regarding relapse prevention and self care. 6. Health care follow up as needed for medical problems. 7. Restart home medications where appropriate.   I certify that inpatient services furnished can reasonably be expected to improve the patient's condition.  Akintayo, Mojeed,MD 06/09/2012, 12:01 PM

## 2012-06-09 NOTE — Progress Notes (Signed)
Pt didn't attend karaoke group this evening.  

## 2012-06-09 NOTE — Progress Notes (Signed)
D: Patient denies SI/HI and visual hallucinations. The patient reports being positive for auditory hallucinations. The patient states that voices are telling her that she "is not pretty." The patient reports sleeping poorly and states that her appetite and energy level are normal. The patient is isolating on the unit and isn't attending groups. RN dicussed the importance of groups with the patient and firmly reiterated that the patient needed to attend groups. Patient also requesting medication changes.  A: Patient given emotional support from RN. Patient encouraged to come to staff with concerns and/or questions. Patient's medication routine continued. Patient's orders and plan of care reviewed. Patient referred to MD for medication concerns.  R: Patient remains cooperative. MD spoke with patient regarding medications. Will continue to monitor patient q15 minutes for safety.

## 2012-06-09 NOTE — H&P (Signed)
Psychiatric Admission Assessment Adult  Patient Identification:  Ashlee Day Date of Evaluation:  06/09/2012  Chief Complaint: "I am depressed and hearing voices" History of Present Illness::Patient is 27 year old woman, single, unemployed, who was discharged from [facility] inpatient service last week. Patient has history of  multiple medical problems and prior history of psychosis, OCD and depression. Patient presents with difficulty sleeping, recurrent obsessive thoughts, command auditory hallucination and Visual hallucinations. She reports that she has been hearing voices telling her that "I am not pretty". She denies suicidal and homicidal ideations, intent or plan.  Elements:  Location:  [facility] inpatient. Quality:  severe. Severity:  recurrent. Timing:  in the last few days. Duration:  patient reports she has been hearing voices for the past few days. Context:  unknown. Associated Signs/Synptoms: Depression Symptoms:  depressed mood, anhedonia, psychomotor agitation, difficulty concentrating, hopelessness, anxiety, (Hypo) Manic Symptoms:  Hallucinations, Anxiety Symptoms:  Excessive Worry, Psychotic Symptoms:  Hallucinations: Auditory PTSD Symptoms: Negative  Psychiatric Specialty Exam: Physical Exam  Psychiatric: Her mood appears anxious. Her speech is delayed. She is aggressive and actively hallucinating. Thought content is delusional. Cognition and memory are normal. She expresses impulsivity and inappropriate judgment. She exhibits a depressed mood. She is inattentive.    Review of Systems  Constitutional: Positive for malaise/fatigue.  HENT: Negative.   Eyes: Negative.   Respiratory: Negative.   Cardiovascular: Negative.   Gastrointestinal: Negative.   Genitourinary: Negative.   Musculoskeletal: Positive for myalgias and back pain.  Skin: Negative.   Neurological: Negative.   Endo/Heme/Allergies: Negative.   Psychiatric/Behavioral: Positive for depression and  hallucinations. The patient is nervous/anxious and has insomnia.     Blood pressure 151/101, pulse 76, temperature 97.9 F (36.6 C), temperature source Oral, resp. rate 20, height 5\' 3"  (1.6 m), weight 87.998 kg (194 lb), SpO2 95.00%.Body mass index is 34.37 kg/(m^2).  General Appearance: Fairly Groomed  Patent attorney::  Minimal  Speech:  Pressured and Slow  Volume:  Decreased  Mood:  Dysphoric and Hopeless  Affect:  Blunt  Thought Process:  Disorganized  Orientation:  Full (Time, Place, and Person)  Thought Content:  Hallucinations: Auditory and Obsessions  Suicidal Thoughts:  No  Homicidal Thoughts:  No  Memory:  Immediate;   Fair Recent;   Fair Remote;   Fair  Judgement:  Poor  Insight:  Lacking  Psychomotor Activity:  Decreased  Concentration:  Fair  Recall:  Fair  Akathisia:  No  Handed:  Right  AIMS (if indicated):     Assets:  Desire for Improvement Social Support Others:  lives with her mother  Sleep:  Number of Hours: 4    Past Psychiatric History: Diagnosis:  Hospitalizations:  Outpatient Care:  Substance Abuse Care:  Self-Mutilation:  Suicidal Attempts:  Violent Behaviors:   Past Medical History:   Past Medical History  Diagnosis Date  . Fibromyalgia   . Depression   . Ileus     x3-4  . Hashimoto's thyroiditis     2005ish  . Hyperparathyroidism, unspecified   . Kidney stone     2013  . Bipolar disorder   . GERD (gastroesophageal reflux disease)   . Migraines     occasional aura  . Partial edentulism   . Torticollis     Allergies:   Allergies  Allergen Reactions  . Amoxicillin Other (See Comments)    Blisters all over Cannot take any 'cillins' at all  . Demerol (Meperidine) Nausea And Vomiting    Can not tolerate  Iv administration Can tolerate IM adminstration  . Imitrex (Sumatriptan)     Severity unknown  . Penicillins Other (See Comments)    Cannot not tolerate any cillins due to amoxicillin reaction   . Sulfa Antibiotics Nausea And  Vomiting  . Depacon (Valproate Sodium) Nausea And Vomiting and Rash  . Depakote (Divalproex Sodium) Nausea And Vomiting and Rash  . Dihydroergotamine Nausea And Vomiting and Rash  . Metoclopramide Nausea And Vomiting and Rash    React to IV form Can tolerate oral medication  . Morphine And Related Rash   PTA Medications: Prescriptions prior to admission  Medication Sig Dispense Refill  . benztropine (COGENTIN) 1 MG tablet Take 1 tablet (1 mg total) by mouth 2 (two) times daily.  60 tablet  0  . calcium carbonate (OS-CAL) 600 MG TABS Take 600 mg by mouth daily.      . cholecalciferol (VITAMIN D) 1000 UNITS tablet Take 1,000 Units by mouth daily.      Marland Kitchen gabapentin (NEURONTIN) 300 MG capsule Take 1 capsule (300 mg total) by mouth 3 (three) times daily. For anxiety and panic  90 capsule  0  . haloperidol (HALDOL) 10 MG tablet Take 1 tablet (10 mg total) by mouth 3 (three) times daily. For psychosis and agitation.  90 tablet  0  . hydrOXYzine (ATARAX/VISTARIL) 50 MG tablet Take 1 tablet (50 mg total) by mouth at bedtime as needed (sleep). For insomnia.  30 tablet  0  . lamoTRIgine (LAMICTAL) 200 MG tablet Take 1 tablet (200 mg total) by mouth at bedtime. For mood lability.  60 tablet  0  . naproxen (NAPROSYN) 500 MG tablet Take 1 tablet (500 mg total) by mouth 2 (two) times daily with a meal. For pain.  60 tablet  0  . norethindrone-ethinyl estradiol (MICROGESTIN,JUNEL,LOESTRIN) 1-20 MG-MCG tablet Take 1 tablet by mouth daily.      Marland Kitchen venlafaxine XR (EFFEXOR-XR) 150 MG 24 hr capsule Take 2 capsules (300 mg total) by mouth daily.  60 capsule  0  . zonisamide (ZONEGRAN) 100 MG capsule Take 3 capsules (300 mg total) by mouth at bedtime. For seizures.        Previous Psychotropic Medications:  Medication/Dose                 Substance Abuse History in the last 12 months:  no  Consequences of Substance Abuse: Negative  Social History:  reports that she has never smoked. She does not  have any smokeless tobacco history on file. She reports that she does not drink alcohol or use illicit drugs. Additional Social History: Pain Medications: Denies Prescriptions: Denies Over the Counter: Denies History of alcohol / drug use?: No history of alcohol / drug abuse Longest period of sobriety (when/how long): NA                    Current Place of Residence:   Place of Birth:   Family Members: Marital Status:  Single Children:  Sons:  Daughters: Relationships: Education:  [employer] Problems/Performance: Religious Beliefs/Practices: History of Abuse (Emotional/Phsycial/Sexual) Teacher, music History:  None. Legal History: Hobbies/Interests:  Family History:   Family History  Problem Relation Age of Onset  . Hypertension Father   . Coronary artery disease Paternal Grandfather     smoker  . Cancer Maternal Grandfather     MM  . Diabetes Paternal Grandfather     Medication induced  . Hypertension Maternal Grandmother   . Migraines Mother   .  Rheum arthritis Mother     undifferentiated connective tissue    Results for orders placed during the hospital encounter of 06/08/12 (from the past 72 hour(s))  URINALYSIS, ROUTINE W REFLEX MICROSCOPIC     Status: Abnormal   Collection Time    06/08/12  5:47 PM      Result Value Range   Color, Urine YELLOW  YELLOW   APPearance CLOUDY (*) CLEAR   Specific Gravity, Urine 1.015  1.005 - 1.030   pH 5.5  5.0 - 8.0   Glucose, UA NEGATIVE  NEGATIVE mg/dL   Hgb urine dipstick NEGATIVE  NEGATIVE   Bilirubin Urine NEGATIVE  NEGATIVE   Ketones, ur NEGATIVE  NEGATIVE mg/dL   Protein, ur NEGATIVE  NEGATIVE mg/dL   Urobilinogen, UA 0.2  0.0 - 1.0 mg/dL   Nitrite NEGATIVE  NEGATIVE   Leukocytes, UA TRACE (*) NEGATIVE  PREGNANCY, URINE     Status: None   Collection Time    06/08/12  5:47 PM      Result Value Range   Preg Test, Ur NEGATIVE  NEGATIVE  URINE RAPID DRUG SCREEN (HOSP  PERFORMED)     Status: Abnormal   Collection Time    06/08/12  5:47 PM      Result Value Range   Opiates NONE DETECTED  NONE DETECTED   Cocaine NONE DETECTED  NONE DETECTED   Benzodiazepines NONE DETECTED  NONE DETECTED   Amphetamines NONE DETECTED  NONE DETECTED   Tetrahydrocannabinol NONE DETECTED  NONE DETECTED   Barbiturates POSITIVE (*) NONE DETECTED   Comment:            DRUG SCREEN FOR MEDICAL PURPOSES     ONLY.  IF CONFIRMATION IS NEEDED     FOR ANY PURPOSE, NOTIFY LAB     WITHIN 5 DAYS.                LOWEST DETECTABLE LIMITS     FOR URINE DRUG SCREEN     Drug Class       Cutoff (ng/mL)     Amphetamine      1000     Barbiturate      200     Benzodiazepine   200     Tricyclics       300     Opiates          300     Cocaine          300     THC              50  URINE MICROSCOPIC-ADD ON     Status: Abnormal   Collection Time    06/08/12  5:47 PM      Result Value Range   Squamous Epithelial / LPF MANY (*) RARE   WBC, UA 3-6  <3 WBC/hpf   Bacteria, UA FEW (*) RARE  CBC     Status: Abnormal   Collection Time    06/08/12  7:53 PM      Result Value Range   WBC 7.0  4.0 - 10.5 K/uL   RBC 4.86  3.87 - 5.11 MIL/uL   Hemoglobin 12.4  12.0 - 15.0 g/dL   HCT 16.1  09.6 - 04.5 %   MCV 79.4  78.0 - 100.0 fL   MCH 25.5 (*) 26.0 - 34.0 pg   MCHC 32.1  30.0 - 36.0 g/dL   RDW 40.9  81.1 - 91.4 %   Platelets 248  150 - 400 K/uL  COMPREHENSIVE METABOLIC PANEL     Status: Abnormal   Collection Time    06/08/12  7:53 PM      Result Value Range   Sodium 139  135 - 145 mEq/L   Potassium 4.1  3.5 - 5.1 mEq/L   Chloride 104  96 - 112 mEq/L   CO2 23  19 - 32 mEq/L   Glucose, Bld 104 (*) 70 - 99 mg/dL   BUN 15  6 - 23 mg/dL   Creatinine, Ser 4.78  0.50 - 1.10 mg/dL   Calcium 8.9  8.4 - 29.5 mg/dL   Total Protein 6.7  6.0 - 8.3 g/dL   Albumin 3.6  3.5 - 5.2 g/dL   AST 11  0 - 37 U/L   ALT 14  0 - 35 U/L   Alkaline Phosphatase 93  39 - 117 U/L   Total Bilirubin 0.1 (*) 0.3 -  1.2 mg/dL   GFR calc non Af Amer >90  >90 mL/min   GFR calc Af Amer >90  >90 mL/min   Comment:            The eGFR has been calculated     using the CKD EPI equation.     This calculation has not been     validated in all clinical     situations.     eGFR's persistently     <90 mL/min signify     possible Chronic Kidney Disease.  HEMOGLOBIN A1C     Status: None   Collection Time    06/08/12  7:53 PM      Result Value Range   Hemoglobin A1C 5.4  <5.7 %   Comment: (NOTE)                                                                               According to the ADA Clinical Practice Recommendations for 2011, when     HbA1c is used as a screening test:      >=6.5%   Diagnostic of Diabetes Mellitus               (if abnormal result is confirmed)     5.7-6.4%   Increased risk of developing Diabetes Mellitus     References:Diagnosis and Classification of Diabetes Mellitus,Diabetes     Care,2011,34(Suppl 1):S62-S69 and Standards of Medical Care in             Diabetes - 2011,Diabetes Care,2011,34 (Suppl 1):S11-S61.   Mean Plasma Glucose 108  <117 mg/dL   Psychological Evaluations:  Assessment:   AXIS I:  Major depressive disorder, recurrent episode, severe, specified as with psychotic behavior              Obsessive compulsive disorder  AXIS II:  Dependent Personality  AXIS III:   Past Medical History  Diagnosis Date  . Fibromyalgia   . Ileus     x3-4  . Hashimoto's thyroiditis     2005ish  . Hyperparathyroidism, unspecified   . Kidney stone     2013  . GERD (gastroesophageal reflux disease)   . Migraines     occasional aura  . Partial edentulism   .  Torticollis    AXIS IV:  other psychosocial or environmental problems and problems related to social environment AXIS V:  21-30 behavior considerably influenced by delusions or hallucinations OR serious impairment in judgment, communication OR inability to function in almost all areas  Treatment Plan/Recommendations:1.  Admit for crisis management and stabilization. 2. Medication management to reduce current symptoms to base line and improve the     patient's overall level of functioning 3. Treat health problems as indicated. 4. Develop treatment plan to decrease risk of relapse upon discharge and the need for     readmission. 5. Psycho-social education regarding relapse prevention and self care. 6. Health care follow up as needed for medical problems. 7. Restart home medications where appropriate.   Treatment Plan Summary: Daily contact with patient to assess and evaluate symptoms and progress in treatment Medication management Current Medications:  Current Facility-Administered Medications  Medication Dose Route Frequency Natalyn Szymanowski Last Rate Last Dose  . acetaminophen (TYLENOL) tablet 650 mg  650 mg Oral Q6H PRN Verne Spurr, PA-C      . alum & mag hydroxide-simeth (MAALOX/MYLANTA) 200-200-20 MG/5ML suspension 30 mL  30 mL Oral Q4H PRN Verne Spurr, PA-C      . gabapentin (NEURONTIN) capsule 300 mg  300 mg Oral TID Verne Spurr, PA-C   300 mg at 06/09/12 1126  . haloperidol (HALDOL) tablet 10 mg  10 mg Oral TID Verne Spurr, PA-C   10 mg at 06/09/12 1126  . haloperidol (HALDOL) tablet 5 mg  5 mg Oral Q6H PRN Verne Spurr, PA-C   5 mg at 06/08/12 2239  . hydrOXYzine (ATARAX/VISTARIL) tablet 50 mg  50 mg Oral QHS PRN Verne Spurr, PA-C   50 mg at 06/08/12 2239  . lamoTRIgine (LAMICTAL) tablet 200 mg  200 mg Oral QHS Verne Spurr, PA-C   200 mg at 06/08/12 2239  . magnesium hydroxide (MILK OF MAGNESIA) suspension 30 mL  30 mL Oral Daily PRN Verne Spurr, PA-C      . naproxen (NAPROSYN) tablet 500 mg  500 mg Oral BID WC Verne Spurr, PA-C   500 mg at 06/09/12 0757  . norethindrone-ethinyl estradiol (MICROGESTIN,JUNEL,LOESTRIN) 1-20 MG-MCG tablet 1 tablet  1 tablet Oral Daily Verne Spurr, PA-C      . trihexyphenidyl (ARTANE) tablet 5 mg  5 mg Oral BID WC Mojeed Akintayo      . venlafaxine XR  (EFFEXOR-XR) 24 hr capsule 300 mg  300 mg Oral Daily Verne Spurr, PA-C   300 mg at 06/09/12 0757    Observation Level/Precautions:  routinue  Laboratory:  routine  Psychotherapy:    Medications:    Consultations:    Discharge Concerns:    Estimated LOS:  Other:     I certify that inpatient services furnished can reasonably be expected to improve the patient's condition.   Thedore Mins, MD 3/7/201412:02 PM

## 2012-06-10 DIAGNOSIS — F323 Major depressive disorder, single episode, severe with psychotic features: Secondary | ICD-10-CM

## 2012-06-10 MED ORDER — IBUPROFEN 400 MG PO TABS
400.0000 mg | ORAL_TABLET | Freq: Four times a day (QID) | ORAL | Status: DC | PRN
  Administered 2012-06-10 – 2012-06-13 (×4): 400 mg via ORAL
  Filled 2012-06-10 (×4): qty 1

## 2012-06-10 NOTE — Progress Notes (Signed)
Psychoeducational Group Note  Date:  06/10/2012 Time:  0945 am  Group Topic/Focus:  Identifying Needs:   The focus of this group is to help patients identify their personal needs that have been historically problematic and identify healthy behaviors to address their needs.  Participation Level:  None  Participation Quality:  Resistant  Affect:  Anxious  Cognitive:  Alert  Insight:  Limited  Engagement in Group:  Limited  Additional Comments:   Ashlee Day 06/10/2012,12:56 PM

## 2012-06-10 NOTE — Progress Notes (Signed)
D   Pt has been child like and attention seeking  Demanding to go home  She has been tearful   She was offered the quiet room when she became overwrought and asking for ativan  Pt spent some time in the quiet room and has become calmer  She initially refused her 0800 and 1200 dose of haldol but did agree to take it after discussion with pa  A   Verbal support given  Medications administered and effectiveness monitored   Q 15 min checks  R   Pt safe at present

## 2012-06-10 NOTE — Progress Notes (Signed)
Patient ID: TAYJA MANZER, female   DOB: 1985/12/06, 27 y.o.   MRN: [id number] Psychoeducational Group Note  Date:  06/10/2012 Time:0900am  Group Topic/Focus:  Identifying Needs:   The focus of this group is to help patients identify their personal needs that have been historically problematic and identify healthy behaviors to address their needs.  Participation Level:  Did Not Attend  Participation Quality:    Affect:  Cognitive:   Insight:   Engagement in Group:  Additional Comments:  Inventory sheet  Valente David 06/10/2012,9:43 AM

## 2012-06-10 NOTE — Progress Notes (Signed)
Patient ID: Ashlee Day, female   DOB: February 13, 1986, 27 y.o.   MRN: [id number]  D:  Pt denies SI/HI. Pt continues to request calling Dr for something to sleep. Pt continues to exhibit med seeking behaviors. Pt tries to manipulate situations, knowing Dr told he one thing in the morning, but she tries to get writer to get her what she wants.   A: Pt was offered support and encouragement. Pt was given scheduled medications. Pt was encourage to attend groups. Q 15 minute checks were done for safety. Informed pt importance of telling Dr during the day to assess getting something to help her sleep and anything else she needs.    R:Pt attends groups and interacts well with peers and staff. Pt is taking medication. Pt has no complaints.Pt receptive to treatment and safety maintained on unit.

## 2012-06-10 NOTE — Progress Notes (Signed)
[facility] MD Progress Note  06/10/2012 2:19 PM Ashlee Day  MRN:  [id number] Subjective:  "I'm a little better today." she goes on to say that "the Haldol is making the voices worse, and my FB friends are coming back."  "Can I go home today?" "Can I have something for my nerves."  Objective: Ashlee Day has been up and active in the unit milieu. She cooperative and polite with this examiner, and is engaging in order to go home. She is asking for anxiety medication, and reported that she can't go to groups due to starting her  Period because she didn't have her BCP.  Patient is actively twitching while speaking with me today.  It worsens when she is informed that she is not going home, but she did agree to take her medication and went to the medication window. Diagnosis: MDD severe with psychotic behavior  ADL's:  Intact  Sleep: Fair  Appetite:  Good  Suicidal Ideation:  denies Homicidal Ideation:  denies AEB (as evidenced by):patient's response, affect and report of reduction in symptoms.  Psychiatric Specialty Exam: Review of Systems  Constitutional: Negative.  Negative for fever, chills, weight loss, malaise/fatigue and diaphoresis.  HENT: Negative for congestion and sore throat.   Eyes: Negative for blurred vision, double vision and photophobia.  Respiratory: Negative for cough, shortness of breath and wheezing.   Cardiovascular: Negative for chest pain, palpitations and PND.  Gastrointestinal: Positive for abdominal pain (menstrual cramps). Negative for heartburn, nausea, vomiting, diarrhea and constipation.  Musculoskeletal: Negative for myalgias, joint pain and falls.  Neurological: Negative for dizziness, tingling, tremors, sensory change, speech change, focal weakness, seizures, loss of consciousness, weakness and headaches.  Endo/Heme/Allergies: Negative for polydipsia. Does not bruise/bleed easily.  Psychiatric/Behavioral: Negative for depression, suicidal ideas, hallucinations,  memory loss and substance abuse. The patient is not nervous/anxious and does not have insomnia.     Blood pressure 134/95, pulse 85, temperature 98.3 F (36.8 C), temperature source Oral, resp. rate 20, height 5\' 3"  (1.6 m), weight 87.998 kg (194 lb), SpO2 95.00%.Body mass index is 34.37 kg/(m^2).  General Appearance: Fairly Groomed  Patent attorney::  Fair  Speech:  Clear and Coherent  Volume:  Decreased  Mood:  Anxious and Depressed  Affect:  Appropriate  Thought Process:  Disorganized  Orientation:  Full (Time, Place, and Person)  Thought Content:  Hallucinations: Auditory  Suicidal Thoughts:  No  Homicidal Thoughts:  No  Memory:  Negative  Judgement:  Impaired  Insight:  Lacking  Psychomotor Activity:  Increased  Concentration:  Poor  Recall:  Poor  Akathisia:  No  Handed:  Right  AIMS (if indicated):     Assets:  Housing Physical Health  Sleep:  Number of Hours: 6   Current Medications: Current Facility-Administered Medications  Medication Dose Route Frequency Provider Last Rate Last Dose  . acetaminophen (TYLENOL) tablet 650 mg  650 mg Oral Q6H PRN Verne Spurr, PA-C   650 mg at 06/09/12 2009  . alum & mag hydroxide-simeth (MAALOX/MYLANTA) 200-200-20 MG/5ML suspension 30 mL  30 mL Oral Q4H PRN Verne Spurr, PA-C   30 mL at 06/10/12 0631  . gabapentin (NEURONTIN) capsule 300 mg  300 mg Oral TID Verne Spurr, PA-C   300 mg at 06/10/12 1146  . haloperidol (HALDOL) tablet 5 mg  5 mg Oral Q6H PRN Verne Spurr, PA-C   5 mg at 06/08/12 2239  . haloperidol (HALDOL) tablet 5 mg  5 mg Oral TID Mojeed Akintayo   5 mg  at 06/09/12 1616  . hydrOXYzine (ATARAX/VISTARIL) tablet 50 mg  50 mg Oral QHS PRN Verne Spurr, PA-C   50 mg at 06/10/12 1146  . lamoTRIgine (LAMICTAL) tablet 200 mg  200 mg Oral QHS Verne Spurr, PA-C   200 mg at 06/09/12 2122  . magnesium hydroxide (MILK OF MAGNESIA) suspension 30 mL  30 mL Oral Daily PRN Verne Spurr, PA-C      . naproxen (NAPROSYN) tablet 500 mg   500 mg Oral BID WC Verne Spurr, PA-C   500 mg at 06/10/12 1610  . norethindrone-ethinyl estradiol (MICROGESTIN,JUNEL,LOESTRIN) 1-20 MG-MCG tablet 1 tablet  1 tablet Oral Daily Verne Spurr, PA-C      . trihexyphenidyl (ARTANE) tablet 5 mg  5 mg Oral BID WC Mojeed Akintayo   5 mg at 06/10/12 0833  . venlafaxine XR (EFFEXOR-XR) 24 hr capsule 300 mg  300 mg Oral Daily Verne Spurr, PA-C   300 mg at 06/10/12 9604    Lab Results:  Results for orders placed during the hospital encounter of 06/08/12 (from the past 48 hour(s))  URINALYSIS, ROUTINE W REFLEX MICROSCOPIC     Status: Abnormal   Collection Time    06/08/12  5:47 PM      Result Value Range   Color, Urine YELLOW  YELLOW   APPearance CLOUDY (*) CLEAR   Specific Gravity, Urine 1.015  1.005 - 1.030   pH 5.5  5.0 - 8.0   Glucose, UA NEGATIVE  NEGATIVE mg/dL   Hgb urine dipstick NEGATIVE  NEGATIVE   Bilirubin Urine NEGATIVE  NEGATIVE   Ketones, ur NEGATIVE  NEGATIVE mg/dL   Protein, ur NEGATIVE  NEGATIVE mg/dL   Urobilinogen, UA 0.2  0.0 - 1.0 mg/dL   Nitrite NEGATIVE  NEGATIVE   Leukocytes, UA TRACE (*) NEGATIVE  PREGNANCY, URINE     Status: None   Collection Time    06/08/12  5:47 PM      Result Value Range   Preg Test, Ur NEGATIVE  NEGATIVE  URINE RAPID DRUG SCREEN (HOSP PERFORMED)     Status: Abnormal   Collection Time    06/08/12  5:47 PM      Result Value Range   Opiates NONE DETECTED  NONE DETECTED   Cocaine NONE DETECTED  NONE DETECTED   Benzodiazepines NONE DETECTED  NONE DETECTED   Amphetamines NONE DETECTED  NONE DETECTED   Tetrahydrocannabinol NONE DETECTED  NONE DETECTED   Barbiturates POSITIVE (*) NONE DETECTED   Comment:            DRUG SCREEN FOR MEDICAL PURPOSES     ONLY.  IF CONFIRMATION IS NEEDED     FOR ANY PURPOSE, NOTIFY LAB     WITHIN 5 DAYS.                LOWEST DETECTABLE LIMITS     FOR URINE DRUG SCREEN     Drug Class       Cutoff (ng/mL)     Amphetamine      1000     Barbiturate      200      Benzodiazepine   200     Tricyclics       300     Opiates          300     Cocaine          300     THC  50  URINE MICROSCOPIC-ADD ON     Status: Abnormal   Collection Time    06/08/12  5:47 PM      Result Value Range   Squamous Epithelial / LPF MANY (*) RARE   WBC, UA 3-6  <3 WBC/hpf   Bacteria, UA FEW (*) RARE  URINE CULTURE     Status: None   Collection Time    06/08/12  5:47 PM      Result Value Range   Specimen Description URINE, RANDOM     Special Requests NONE     Culture  Setup Time 06/09/2012 15:46     Colony Count 20,OOO COLONIES/ML     Culture       Value: STAPHYLOCOCCUS SPECIES (COAGULASE NEGATIVE)     Note: RIFAMPIN AND GENTAMICIN SHOULD NOT BE USED AS SINGLE DRUGS FOR TREATMENT OF STAPH INFECTIONS.   Report Status PENDING    CBC     Status: Abnormal   Collection Time    06/08/12  7:53 PM      Result Value Range   WBC 7.0  4.0 - 10.5 K/uL   RBC 4.86  3.87 - 5.11 MIL/uL   Hemoglobin 12.4  12.0 - 15.0 g/dL   HCT 56.2  13.0 - 86.5 %   MCV 79.4  78.0 - 100.0 fL   MCH 25.5 (*) 26.0 - 34.0 pg   MCHC 32.1  30.0 - 36.0 g/dL   RDW 78.4  69.6 - 29.5 %   Platelets 248  150 - 400 K/uL  COMPREHENSIVE METABOLIC PANEL     Status: Abnormal   Collection Time    06/08/12  7:53 PM      Result Value Range   Sodium 139  135 - 145 mEq/L   Potassium 4.1  3.5 - 5.1 mEq/L   Chloride 104  96 - 112 mEq/L   CO2 23  19 - 32 mEq/L   Glucose, Bld 104 (*) 70 - 99 mg/dL   BUN 15  6 - 23 mg/dL   Creatinine, Ser 2.84  0.50 - 1.10 mg/dL   Calcium 8.9  8.4 - 13.2 mg/dL   Total Protein 6.7  6.0 - 8.3 g/dL   Albumin 3.6  3.5 - 5.2 g/dL   AST 11  0 - 37 U/L   ALT 14  0 - 35 U/L   Alkaline Phosphatase 93  39 - 117 U/L   Total Bilirubin 0.1 (*) 0.3 - 1.2 mg/dL   GFR calc non Af Amer >90  >90 mL/min   GFR calc Af Amer >90  >90 mL/min   Comment:            The eGFR has been calculated     using the CKD EPI equation.     This calculation has not been     validated in all  clinical     situations.     eGFR's persistently     <90 mL/min signify     possible Chronic Kidney Disease.  HEMOGLOBIN A1C     Status: None   Collection Time    06/08/12  7:53 PM      Result Value Range   Hemoglobin A1C 5.4  <5.7 %   Comment: (NOTE)  According to the ADA Clinical Practice Recommendations for 2011, when     HbA1c is used as a screening test:      >=6.5%   Diagnostic of Diabetes Mellitus               (if abnormal result is confirmed)     5.7-6.4%   Increased risk of developing Diabetes Mellitus     References:Diagnosis and Classification of Diabetes Mellitus,Diabetes     Care,2011,34(Suppl 1):S62-S69 and Standards of Medical Care in             Diabetes - 2011,Diabetes Care,2011,34 (Suppl 1):S11-S61.   Mean Plasma Glucose 108  <117 mg/dL    Physical Findings: AIMS: Facial and Oral Movements Muscles of Facial Expression: None, normal Lips and Perioral Area: None, normal Jaw: None, normal Tongue: None, normal,Extremity Movements Upper (arms, wrists, hands, fingers): None, normal Lower (legs, knees, ankles, toes): None, normal, Trunk Movements Neck, shoulders, hips: None, normal, Overall Severity Severity of abnormal movements (highest score from questions above): None, normal Incapacitation due to abnormal movements: None, normal Patient's awareness of abnormal movements (rate only patient's report): No Awareness, Dental Status Current problems with teeth and/or dentures?: No Does patient usually wear dentures?: No  CIWA:    COWS:     Treatment Plan Summary: Daily contact with patient to assess and evaluate symptoms and progress in treatment Medication management  Plan: 1. Continue crisis management and stabilization. 2. Medication management to reduce current symptoms to base line and improve patient's overall level of functioning 3. Treat health problems as indicated. 4.  Develop treatment plan to decrease risk of relapse upon discharge and the need for readmission. 5. Psycho-social education regarding relapse prevention and self care. 6. Health care follow up as needed for medical problems. 7. Continue home medications where appropriate. 8. Explained medication management to the patient. 9. Can have anaprox for menstrual pain.   Medical Decision Making Problem Points:  Established problem, stable/improving (1) Data Points:  Review of medication regiment & side effects (2)  I certify that inpatient services furnished can reasonably be expected to improve the patient's condition.   Rona Ravens. Mashburn RPAC 2:33 PM 06/10/2012

## 2012-06-10 NOTE — Clinical Social Work Note (Signed)
[facility] Group Notes:  (Clinical Social Work)  06/10/2012  11:15-11:45AM  Summary of Progress/Problems:   The main focus of today's process group was for the patient to identify ways in which they have in the past sabotaged their own recovery and reasons they may have done this/what they received from doing it.  We then worked to identify a specific plan to avoid doing this when discharged from the hospital for this admission.  The patient expressed little, but laughed throughout group.  When asked what was prompting this, she stated it was the voices, but she could not tell what they were saying.  She did acknowledge that the voices never say anything good, but did not really know why they were making her laugh.  She demonstrated some ongoing jerky motions in her face and upper torso, and writer alerted her nurse, as patient stated she needed a dose of Cogentin to counter her Haldol.  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  She stated that her doctors tell her that it is causing her voices, and that the voices may stay, although somewhat less with medicine.  Type of Therapy:  Group Therapy - Process  Participation Level:  Active  Participation Quality:  Attentive  Affect:  Defensive  Cognitive:  Hallucinating  Insight:  Improving  Engagement in Therapy:  Improving  Modes of Intervention:  Clarification, Education, Limit-setting, Problem-solving, Socialization, Support and Processing, Exploration, Discussion   Ambrose Mantle, LCSW 06/10/2012, 11:53 AM

## 2012-06-11 DIAGNOSIS — F607 Dependent personality disorder: Secondary | ICD-10-CM | POA: Diagnosis present

## 2012-06-11 DIAGNOSIS — F603 Borderline personality disorder: Secondary | ICD-10-CM | POA: Diagnosis present

## 2012-06-11 LAB — URINE CULTURE

## 2012-06-11 NOTE — Progress Notes (Signed)
D   Pt is less anxious this morning   Discussed a goal for her today  And she agreed  The goal was to have a good day go to the cafeteria and try to cope better with her mood fluctions  She has minimal interaction with others A   Verbal support given  Medications administered and effectiveness monitored  Q 15 min checks  Encourage appropriate behaviors  R   Pt safe at present
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
## 2012-06-11 NOTE — Clinical Social Work Note (Signed)
[facility] Group Notes:  (Clinical Social Work)  06/11/2012   11:15-11:45AM  Summary of Progress/Problems:  The main focus of today's process group was to listen to a variety of genres of music and to identify that different types of music provoke different responses.  The patient then was able to identify personally what was soothing for them, as well as energizing.  Handouts were used to record feelings evoked, as well as how patient can personally use this knowledge in sleep habits, with depression, and with other symptoms.  The patient expressed that some of the music made her happy, some made her awake.  She twitched a lot, and kept her eyes closed most of the time, even when saying the music made her feel awake.   Type of Therapy:  Music Therapy with processing done  Participation Level:  Active  Participation Quality:  Attentive  Affect:  Blunted  Cognitive:  Appropriate  Insight:  Developing/Improving  Engagement in Therapy:  Developing/Improving  Modes of Intervention:   Socialization, Support and Processing, Exploration, Education, Rapport Building   [employer], LCSW 06/11/2012, 12:08 PM

## 2012-06-11 NOTE — Progress Notes (Signed)
Patient ID: Ashlee Day, female   DOB: January 28, 1986, 27 y.o.   MRN: [id number] [facility] MD Progress Note  06/11/2012 10:23 AM [name]  MRN:  [id number] Subjective: "I'm doing better, I took my medication, I went to group, I'd like to go home."  Objective: [name] has been up and active in the unit milieu. Yesterday after seeing her she became angry and acted out using profanity, swearing and demanding an injection of her medication, with in of taking her morning and noon doses (combined) of haldol which she had declined earlier. She was sent to the quiet room to calm down and ultimately did so.    Today she is again cooperative, sitting calmly in her chair, but shaking and twitching when it is apparent she is not going home. The chart is reviewed with the patient who denies all of the behavioral issues noted upon her arrival. She states she would never hit her mother, but that she was angry.   Diagnosis: MDD severe with psychotic behavior                     BPD with Cluster, B &C traits ADL's:  Intact  Sleep: Fair  Appetite:  Good  Suicidal Ideation:  denies Homicidal Ideation:  denies AEB (as evidenced by):patient's response, affect and report of reduction in symptoms.  Psychiatric Specialty Exam: Review of Systems  Constitutional: Negative.  Negative for fever, chills, weight loss, malaise/fatigue and diaphoresis.  HENT: Negative for congestion and sore throat.   Eyes: Negative for blurred vision, double vision and photophobia.  Respiratory: Negative for cough, shortness of breath and wheezing.   Cardiovascular: Negative for chest pain, palpitations and PND.  Gastrointestinal: Positive for abdominal pain (menstrual cramps). Negative for heartburn, nausea, vomiting, diarrhea and constipation.  Musculoskeletal: Negative for myalgias, joint pain and falls.  Neurological: Negative for dizziness, tingling, tremors, sensory change, speech change, focal weakness, seizures, loss  of consciousness, weakness and headaches.  Endo/Heme/Allergies: Negative for polydipsia. Does not bruise/bleed easily.  Psychiatric/Behavioral: Negative for depression, suicidal ideas, hallucinations, memory loss and substance abuse. The patient is not nervous/anxious and does not have insomnia.     Blood pressure 121/89, pulse 96, temperature 97.3 F (36.3 C), temperature source Oral, resp. rate 20, height 5\' 3"  (1.6 m), weight 87.998 kg (194 lb), SpO2 95.00%.Body mass index is 34.37 kg/(m^2).  General Appearance: Fairly Groomed  Patent attorney::  Fair  Speech:  Clear and Coherent  Volume:  Decreased  Mood:  Anxious and Depressed  Affect:  Appropriate  Thought Process:  Disorganized  Orientation:  Full (Time, Place, and Person)  Thought Content:  Hallucinations: Auditory  Suicidal Thoughts:  No  Homicidal Thoughts:  No  Memory:  Negative  Judgement:  Impaired  Insight:  Lacking  Psychomotor Activity:  Increased  Concentration:  Poor  Recall:  Poor  Akathisia:  No  Handed:  Right  AIMS (if indicated):     Assets:  Housing Physical Health  Sleep:  Number of Hours: 6   Current Medications: Current Facility-Administered Medications  Medication Dose Route Frequency Provider Last Rate Last Dose  . acetaminophen (TYLENOL) tablet 650 mg  650 mg Oral Q6H PRN Verne Spurr, PA-C   650 mg at 06/09/12 2009  . alum & mag hydroxide-simeth (MAALOX/MYLANTA) 200-200-20 MG/5ML suspension 30 mL  30 mL Oral Q4H PRN Verne Spurr, PA-C   30 mL at 06/10/12 0631  . gabapentin (NEURONTIN) capsule 300 mg  300 mg Oral TID  Verne Spurr, PA-C   300 mg at 06/11/12 0831  . haloperidol (HALDOL) tablet 5 mg  5 mg Oral Q6H PRN Verne Spurr, PA-C   5 mg at 06/10/12 2151  . haloperidol (HALDOL) tablet 5 mg  5 mg Oral TID Mojeed Akintayo   5 mg at 06/11/12 0831  . hydrOXYzine (ATARAX/VISTARIL) tablet 50 mg  50 mg Oral QHS PRN Verne Spurr, PA-C   50 mg at 06/10/12 2148  . ibuprofen (ADVIL,MOTRIN) tablet 400 mg   400 mg Oral Q6H PRN Mickeal Skinner, MD   400 mg at 06/10/12 2147  . lamoTRIgine (LAMICTAL) tablet 200 mg  200 mg Oral QHS Verne Spurr, PA-C   200 mg at 06/10/12 2148  . magnesium hydroxide (MILK OF MAGNESIA) suspension 30 mL  30 mL Oral Daily PRN Verne Spurr, PA-C      . naproxen (NAPROSYN) tablet 500 mg  500 mg Oral BID WC Verne Spurr, PA-C   500 mg at 06/11/12 0830  . norethindrone-ethinyl estradiol (MICROGESTIN,JUNEL,LOESTRIN) 1-20 MG-MCG tablet 1 tablet  1 tablet Oral Daily Verne Spurr, PA-C   1 tablet at 06/11/12 [phone]  . trihexyphenidyl (ARTANE) tablet 5 mg  5 mg Oral BID WC Mojeed Akintayo   5 mg at 06/11/12 0831  . venlafaxine XR (EFFEXOR-XR) 24 hr capsule 300 mg  300 mg Oral Daily Verne Spurr, PA-C   300 mg at 06/11/12 9604    Lab Results:  No results found for this or any previous visit (from the past 48 hour(s)).  Physical Findings: AIMS: Facial and Oral Movements Muscles of Facial Expression: None, normal Lips and Perioral Area: None, normal Jaw: None, normal Tongue: None, normal,Extremity Movements Upper (arms, wrists, hands, fingers): None, normal Lower (legs, knees, ankles, toes): None, normal, Trunk Movements Neck, shoulders, hips: None, normal, Overall Severity Severity of abnormal movements (highest score from questions above): None, normal Incapacitation due to abnormal movements: None, normal Patient's awareness of abnormal movements (rate only patient's report): No Awareness, Dental Status Current problems with teeth and/or dentures?: No Does patient usually wear dentures?: No  CIWA:    COWS:     Treatment Plan Summary: Daily contact with patient to assess and evaluate symptoms and progress in treatment Medication management  Plan: 1. Continue crisis management and stabilization. 2. Medication management to reduce current symptoms to base line and improve patient's overall level of functioning 3. Treat health problems as indicated. 4. Develop  treatment plan to decrease risk of relapse upon discharge and the need for readmission. 5. Psycho-social education regarding relapse prevention and self care. 6. Health care follow up as needed for medical problems. 7. Continue home medications where appropriate. 8. Explained medication management to the patient. 9. Can have anaprox for menstrual pain. 10. Patient is educated that she will have to have 24 hours of appropriate behaviors including taking her medication, attending groups, and avoiding angry outbursts, ending up in the quiet room. If her behavior remains appropriate she can discuss D/C in the AM. 11. Strongly recommend group home placement due to her behavioral issues.  Medical Decision Making Problem Points:  Established problem, stable/improving (1) Data Points:  Review of medication regiment & side effects (2)  I certify that inpatient services furnished can reasonably be expected to improve the patient's condition.   Rona Ravens. Arnel Wymer RPAC 10:23 AM 06/11/2012

## 2012-06-11 NOTE — Progress Notes (Signed)
Pt has had a good day and has behaved appropriately   She has not gone to the quiet room and has been going to the cafeteria for meals

## 2012-06-11 NOTE — Progress Notes (Signed)
Patient ID: Ashlee Day, female   DOB: 04-Apr-1986, 27 y.o.   MRN: [id number] D)  Has been rather needy this evening, came to med window and appears to intentionally make herself shake, blinks her eyes frequently, stated she was hearing voices but couldn't say what they said. Eventually requested to go to quiet room where she has been reading, states still hearing voices.  Head shakes and nods when she is talking to you, but stops when she is alone.  Begins again when she has people nearby.   A)  Will continue to monitor for safety, continue POC, encourage group, meds,  R)  Safety maintained.

## 2012-06-12 NOTE — Progress Notes (Signed)
Patient ID: Ashlee Day, female   DOB: [birth year], 27 y.o.   MRN: [id number] (D)Patient presents with flat blunted affect.  She has not been attending group or participating in her treatment.  Patient met with MD and nurse; she was informed by MD if groups were not attending, she would be discharged home.  Patient is eager for discharge home at this time.  She denies any SI/HI; she does report that she is "hearing voices a little."  She reports that still tell her "Im not pretty."  Patient rocks back and forth while conversing with staff.  She has minimal interaction with staff or peers, electing to stay in the bed.  (A)Continue to monitor medication management and MD orders.  Encourage patient to attend groups and participate in her treatment.  Safety checks continued every 15 minutes per protocol.  (R)Patient continues to remain isolative to her room.

## 2012-06-12 NOTE — Progress Notes (Signed)
Adult Psychoeducational Group Note  Date:  06/12/2012 Time:  11:00am  Group Topic/Focus:  Self Care:   The focus of this group is to help patients understand the importance of self-care in order to improve or restore emotional, physical, spiritual, interpersonal, and financial health.  Participation Level:  Did Not Attend  Participation Quality:    Affect:    Cognitive:    Insight:   Engagement in Group:    Modes of Intervention:    Additional Comments:  Pt refuses to participate in anything besides meal in recreation groups. Pt has been talked to by the nurses and doctor. Pt just lays in the bed all day long.  Isla Pence M 06/12/2012, 5:43 PM

## 2012-06-12 NOTE — Progress Notes (Signed)
Patient ID: Ashlee Day, female   DOB: 1985/04/13, 27 y.o.   MRN: [id number] [facility] MD Progress Note  06/12/2012 9:24 AM AMORY ZBIKOWSKI  MRN:  [id number] Subjective:  "I am still hearing voices"  Objective: Patient reports that she has been doing better on her current medication regimen. She reports decreased anxiety, depressive symptoms and now hears voices intermittently telling her that she is not pretty. Patient thinks she is ready to be discharged and her medications are working better for her. So far, she has not reported any adverse reactions to her medication. Diagnosis: MDD severe with psychotic behavior  ADL's:  Intact  Sleep: Fair  Appetite:  Good  Suicidal Ideation:  denies Homicidal Ideation:  denies AEB (as evidenced by):patient's response, affect and report of reduction in symptoms.  Psychiatric Specialty Exam: Review of Systems  Constitutional: Negative.  Negative for fever, chills, weight loss, malaise/fatigue and diaphoresis.  HENT: Negative for congestion and sore throat.   Eyes: Negative for blurred vision, double vision and photophobia.  Respiratory: Negative for cough, shortness of breath and wheezing.   Cardiovascular: Negative for chest pain, palpitations and PND.  Gastrointestinal: Positive for abdominal pain (menstrual cramps). Negative for heartburn, nausea, vomiting, diarrhea and constipation.  Musculoskeletal: Negative for myalgias, joint pain and falls.  Neurological: Negative for dizziness, tingling, tremors, sensory change, speech change, focal weakness, seizures, loss of consciousness, weakness and headaches.  Endo/Heme/Allergies: Negative for polydipsia. Does not bruise/bleed easily.  Psychiatric/Behavioral: Negative for depression, suicidal ideas, hallucinations, memory loss and substance abuse. The patient is not nervous/anxious and does not have insomnia.     Blood pressure 127/86, pulse 71, temperature 98.6 F (37 C), temperature source Oral,  resp. rate 18, height 5\' 3"  (1.6 m), weight 87.998 kg (194 lb), SpO2 95.00%.Body mass index is 34.37 kg/(m^2).  General Appearance: Fairly Groomed  Patent attorney::  Fair  Speech:  Clear and Coherent  Volume:  Decreased  Mood:  Anxious and Depressed  Affect:  Appropriate  Thought Process:  linear  Orientation:  Full (Time, Place, and Person)  Thought Content:  Hallucinations: Auditory  Suicidal Thoughts:  No  Homicidal Thoughts:  No  Memory:  Negative  Judgement:  Impaired  Insight:  Lacking  Psychomotor Activity:  Increased  Concentration:  Poor  Recall:  Poor  Akathisia:  No  Handed:  Right  AIMS (if indicated):     Assets:  Housing Physical Health  Sleep:  Number of Hours: 5.25   Current Medications: Current Facility-Administered Medications  Medication Dose Route Frequency Meliah Appleman Last Rate Last Dose  . acetaminophen (TYLENOL) tablet 650 mg  650 mg Oral Q6H PRN Verne Spurr, PA-C   650 mg at 06/09/12 2009  . alum & mag hydroxide-simeth (MAALOX/MYLANTA) 200-200-20 MG/5ML suspension 30 mL  30 mL Oral Q4H PRN Verne Spurr, PA-C   30 mL at 06/10/12 0631  . gabapentin (NEURONTIN) capsule 300 mg  300 mg Oral TID Verne Spurr, PA-C   300 mg at 06/12/12 0818  . haloperidol (HALDOL) tablet 5 mg  5 mg Oral Q6H PRN Verne Spurr, PA-C   5 mg at 06/10/12 2151  . haloperidol (HALDOL) tablet 5 mg  5 mg Oral TID Mojeed Akintayo   5 mg at 06/12/12 0818  . hydrOXYzine (ATARAX/VISTARIL) tablet 50 mg  50 mg Oral QHS PRN Verne Spurr, PA-C   50 mg at 06/11/12 2100  . ibuprofen (ADVIL,MOTRIN) tablet 400 mg  400 mg Oral Q6H PRN Mickeal Skinner, MD   400 mg  at 06/11/12 2059  . lamoTRIgine (LAMICTAL) tablet 200 mg  200 mg Oral QHS Verne Spurr, PA-C   200 mg at 06/11/12 2059  . magnesium hydroxide (MILK OF MAGNESIA) suspension 30 mL  30 mL Oral Daily PRN Verne Spurr, PA-C      . naproxen (NAPROSYN) tablet 500 mg  500 mg Oral BID WC Verne Spurr, PA-C   500 mg at 06/12/12 0818  .  norethindrone-ethinyl estradiol (MICROGESTIN,JUNEL,LOESTRIN) 1-20 MG-MCG tablet 1 tablet  1 tablet Oral Daily Verne Spurr, PA-C   1 tablet at 06/12/12 0800  . trihexyphenidyl (ARTANE) tablet 5 mg  5 mg Oral BID WC Mojeed Akintayo   5 mg at 06/12/12 0818  . venlafaxine XR (EFFEXOR-XR) 24 hr capsule 300 mg  300 mg Oral Daily Verne Spurr, PA-C   300 mg at 06/12/12 1610    Lab Results:  No results found for this or any previous visit (from the past 48 hour(s)).  Physical Findings: AIMS: Facial and Oral Movements Muscles of Facial Expression: None, normal Lips and Perioral Area: None, normal Jaw: None, normal Tongue: None, normal,Extremity Movements Upper (arms, wrists, hands, fingers): None, normal Lower (legs, knees, ankles, toes): None, normal, Trunk Movements Neck, shoulders, hips: None, normal, Overall Severity Severity of abnormal movements (highest score from questions above): None, normal Incapacitation due to abnormal movements: None, normal Patient's awareness of abnormal movements (rate only patient's report): No Awareness, Dental Status Current problems with teeth and/or dentures?: No Does patient usually wear dentures?: No  CIWA:    COWS:     Treatment Plan Summary: Daily contact with patient to assess and evaluate symptoms and progress in treatment Medication management  Plan: 1. Continue crisis management and stabilization. 2. Medication management to reduce current symptoms to base line and improve patient's overall level of functioning 3. Treat health problems as indicated. 4. Develop treatment plan to decrease risk of relapse upon discharge and the need for readmission. 5. Psycho-social education regarding relapse prevention and self care. 6. Health care follow up as needed for medical problems. 7. Continue home medications where appropriate. 8. Will continue current medication regimen.    Medical Decision Making Problem Points:  Established problem,  stable/improving (1) Data Points:  Review of medication regiment & side effects (2)  I certify that inpatient services furnished can reasonably be expected to improve the patient's condition.   Thedore Mins, MD 9:24 AM 06/12/2012

## 2012-06-12 NOTE — Treatment Plan (Signed)
  Interdisciplinary Treatment Plan Update   Date Reviewed:  06/12/2012  Time Reviewed:  8:19 AM  Progress in Treatment:   Attending groups: Sporadically Participating in groups: Minimally Taking medication as prescribed: Yes  Tolerating medication: Yes Family/Significant other contact made: Yes  Patient understands diagnosis: No  Minimal insight Discussing patient identified problems/goals with staff: Yes  See initial tx plan Medical problems stabilized or resolved: Yes Denies suicidal/homicidal ideation: Yes Patient has not harmed self or others: Yes  For review of initial/current patient goals, please see plan of care.  Estimated Length of Stay:  2-3 days  Reason for Continuation of Hospitalization: Depression Hallucinations Medication stabilization  New Problems/Goals identified:  N/A  Discharge Plan or Barriers:   return home, follow up outpt  Additional Comments:  Attendees:  Signature: Thedore Mins, MD 06/12/2012 8:19 AM   Signature: Richelle Ito, LCSW 06/12/2012 8:19 AM  Signature:  06/12/2012 8:19 AM  Signature: Joslyn Devon, RN 06/12/2012 8:19 AM  Signature:  06/12/2012 8:19 AM  Signature:  06/12/2012 8:19 AM  Signature:   06/12/2012 8:19 AM  Signature:    Signature:    Signature:    Signature:    Signature:    Signature:      Scribe for Treatment Team:   Richelle Ito, LCSW  06/12/2012 8:19 AM

## 2012-06-12 NOTE — Progress Notes (Signed)
Pt has been in her room most of the evening.  She requested towels earlier to take a shower.  She did not attend evening group, but stayed in her room.  She was observed rocking back and forth when talking with this Clinical research associate, as other staff have noticed her doing.  She denies SI/HI/AV to this Clinical research associate.  She says the voices are decreasing.  Her behavior has been appropriate thus far tonight, although she did not attend group.  Pt makes her needs known to staff.  It is undetermined at this time when pt will be discharged.  Support/encouragement given.  Safety maintained with q15 minute checks.

## 2012-06-12 NOTE — Progress Notes (Signed)
Recreation Therapy Notes  Date:03.10.2014 Time: 9:30am Location: [address]      Group Topic/Focus: Leisure Education  Participation Level: Did not attend  [employer], LRT/CTRS  Jearl Klinefelter 06/12/2012 11:55 AM

## 2012-06-13 NOTE — Progress Notes (Signed)
Pt reports she is ready to go home.  She has not been participating with unit activities, although she did attend evening group tonight.  She denies SI/HI at this time.  She says she hears voices telling her she is not pretty.  Pt makes her needs known to staff.  Pt still complaining of back pain and meds given as ordered.  Support and encouragement offered.  Safety maintained with q15 minute checks.

## 2012-06-13 NOTE — Progress Notes (Signed)
Psychoeducational Group Note  Date:  06/13/2012 Time:  0930  Group Topic/Focus:  Recovery Goals:   The focus of this group is to identify appropriate goals for recovery and establish a plan to achieve them.  Participation Level: Did Not Attend  Participation Quality:  Not Applicable  Affect:  Not Applicable  Cognitive:  Not Applicable  Insight:  Not Applicable  Engagement in Group: Not Applicable  Additional Comments:  Pt refused to attend group this morning.  [name], JOEL E 06/13/2012, 11:15 AM

## 2012-06-13 NOTE — Progress Notes (Signed)
Adult Psychoeducational Group Note  Date:  06/13/2012 Time:  8:00PM  Group Topic/Focus:  Wrap-Up Group:   The focus of this group is to help patients review their daily goal of treatment and discuss progress on daily workbooks.  Participation Level:  Minimal  Participation Quality:  Appropriate  Affect:  Flat  Cognitive:  Appropriate  Insight: Lacking  Engagement in Group:  Lacking  Modes of Intervention:  Clarification, Exploration and Support  Additional Comments:  Pt stated that one positive is that her mom was able to come to visit. Pt stated that tomorrow that she wanted to work on going home. Pt reported that two ways to assist with going home were praying and going to her groups.  McKenzie, Randal Buba 06/13/2012, 8:30 PM

## 2012-06-13 NOTE — Progress Notes (Signed)
Patient ID: Ashlee Day, female   DOB: June 04, 1985, 27 y.o.   MRN: [id number] [facility] MD Progress Note  06/13/2012 10:34 AM Ashlee Day  MRN:  [id number] Subjective:  "I am still hearing voices occasionally"  Objective: Patient reports decreased anxiety, depressive symptoms and now hears voices intermittently telling her that she is not pretty. Patient thinks she is ready to be discharged and her medications are working better for her. She is no longer participating in the unit activities and milieu.  So far, she has not reported any adverse reactions to her medication.  Diagnosis: MDD severe with psychotic behavior  ADL's:  Intact  Sleep: Fair  Appetite:  Good  Suicidal Ideation:  denies Homicidal Ideation:  denies AEB (as evidenced by):patient's response, affect and report of reduction in symptoms.  Psychiatric Specialty Exam: Review of Systems  Constitutional: Negative.  Negative for fever, chills, weight loss, malaise/fatigue and diaphoresis.  HENT: Negative for congestion and sore throat.   Eyes: Negative for blurred vision, double vision and photophobia.  Respiratory: Negative for cough, shortness of breath and wheezing.   Cardiovascular: Negative for chest pain, palpitations and PND.  Gastrointestinal: Negative for heartburn, nausea, vomiting, diarrhea and constipation. Abdominal pain: menstrual cramps.  Musculoskeletal: Negative for myalgias, joint pain and falls.  Neurological: Negative for dizziness, tingling, tremors, sensory change, speech change, focal weakness, seizures, loss of consciousness, weakness and headaches.  Endo/Heme/Allergies: Negative for polydipsia. Does not bruise/bleed easily.  Psychiatric/Behavioral: Negative for depression, suicidal ideas, hallucinations, memory loss and substance abuse. The patient is not nervous/anxious and does not have insomnia.     Blood pressure 123/85, pulse 82, temperature 98.2 F (36.8 C), temperature source Oral, resp. rate  18, height 5\' 3"  (1.6 m), weight 87.998 kg (194 lb), SpO2 95.00%.Body mass index is 34.37 kg/(m^2).  General Appearance: Fairly Groomed  Patent attorney::  Fair  Speech:  Clear and Coherent  Volume:  Decreased  Mood:  Less anxious  Affect:  Appropriate  Thought Process:  linear  Orientation:  Full (Time, Place, and Person)  Thought Content:  Decrease hallucinations  Suicidal Thoughts:  No  Homicidal Thoughts:  No  Memory:  Negative  Judgement: shalow  Insight:  Lacking  Psychomotor Activity:  Increased  Concentration:  fair  Recall:  fair  Akathisia:  No  Handed:  Right  AIMS (if indicated):     Assets:  Housing Physical Health  Sleep:  Number of Hours: 4   Current Medications: Current Facility-Administered Medications  Medication Dose Route Frequency Provider Last Rate Last Dose  . acetaminophen (TYLENOL) tablet 650 mg  650 mg Oral Q6H PRN Verne Spurr, PA-C   650 mg at 06/09/12 2009  . alum & mag hydroxide-simeth (MAALOX/MYLANTA) 200-200-20 MG/5ML suspension 30 mL  30 mL Oral Q4H PRN Verne Spurr, PA-C   30 mL at 06/10/12 0631  . gabapentin (NEURONTIN) capsule 300 mg  300 mg Oral TID Verne Spurr, PA-C   300 mg at 06/13/12 0800  . haloperidol (HALDOL) tablet 5 mg  5 mg Oral Q6H PRN Verne Spurr, PA-C   5 mg at 06/10/12 2151  . haloperidol (HALDOL) tablet 5 mg  5 mg Oral TID Mojeed Akintayo   5 mg at 06/13/12 0800  . hydrOXYzine (ATARAX/VISTARIL) tablet 50 mg  50 mg Oral QHS PRN Verne Spurr, PA-C   50 mg at 06/12/12 2256  . ibuprofen (ADVIL,MOTRIN) tablet 400 mg  400 mg Oral Q6H PRN Mickeal Skinner, MD   400 mg at 06/12/12 1704  .  lamoTRIgine (LAMICTAL) tablet 200 mg  200 mg Oral QHS Verne Spurr, PA-C   200 mg at 06/12/12 2138  . magnesium hydroxide (MILK OF MAGNESIA) suspension 30 mL  30 mL Oral Daily PRN Verne Spurr, PA-C      . naproxen (NAPROSYN) tablet 500 mg  500 mg Oral BID WC Verne Spurr, PA-C   500 mg at 06/13/12 0800  . norethindrone-ethinyl estradiol  (MICROGESTIN,JUNEL,LOESTRIN) 1-20 MG-MCG tablet 1 tablet  1 tablet Oral Daily Verne Spurr, PA-C   1 tablet at 06/13/12 0802  . trihexyphenidyl (ARTANE) tablet 5 mg  5 mg Oral BID WC Mojeed Akintayo   5 mg at 06/13/12 0801  . venlafaxine XR (EFFEXOR-XR) 24 hr capsule 300 mg  300 mg Oral Daily Verne Spurr, PA-C   300 mg at 06/13/12 0800    Lab Results:  No results found for this or any previous visit (from the past 48 hour(s)).  Physical Findings: AIMS: Facial and Oral Movements Muscles of Facial Expression: None, normal Lips and Perioral Area: None, normal Jaw: None, normal Tongue: None, normal,Extremity Movements Upper (arms, wrists, hands, fingers): None, normal Lower (legs, knees, ankles, toes): None, normal, Trunk Movements Neck, shoulders, hips: None, normal, Overall Severity Severity of abnormal movements (highest score from questions above): None, normal Incapacitation due to abnormal movements: None, normal Patient's awareness of abnormal movements (rate only patient's report): No Awareness, Dental Status Current problems with teeth and/or dentures?: No Does patient usually wear dentures?: No  CIWA:    COWS:     Treatment Plan Summary: Daily contact with patient to assess and evaluate symptoms and progress in treatment Medication management  Plan: 1. Continue crisis management and stabilization. 2. Medication management to reduce current symptoms to base line and improve patient's overall level of functioning 3. Treat health problems as indicated. 4. Develop treatment plan to decrease risk of relapse upon discharge and the need for readmission. 5. Psycho-social education regarding relapse prevention and self care. 6. Health care follow up as needed for medical problems. 7. Continue home medications where appropriate. 8. Will continue current medication regimen.    Medical Decision Making Problem Points:  Established problem, stable/improving (1) Data Points:   Review of medication regiment & side effects (2)  I certify that inpatient services furnished can reasonably be expected to improve the patient's condition.   Thedore Mins, MD 10:34 AM 06/13/2012

## 2012-06-13 NOTE — Progress Notes (Signed)
Patient ID: Ashlee Day, female   DOB: March 15, 1986, 27 y.o.   MRN: [id number] (D)Patient remains isolative to room, electing to not attend groups or participate in her treatment.  She reports auditory hallucinations that tell her "your're not pretty."  Patient interacts minimally with staff and peers on the unit.  She complains of decreased sleep during the night, however, she is sleeping during the day.  She denies any SI/HI.  She is compliant with her medications; voices some complaints of severe pain and nausea.  She was given ginger ale for the nausea and her pain is treated with naproxen and advil.  Patient has ongoing chronic back pain that she states is not relieved with the medications she is receiving.  (A)Continue to monitor medication management and MD orders.  Safety checks completed every 15 minutes per protocol. (R)Patient's behavior has been appropriate, however, she is isolative to her room.

## 2012-06-13 NOTE — Clinical Social Work Note (Signed)
[facility] LCSW Aftercare Discharge Planning Group Note  06/13/2012 9:40 AM  Participation Quality: Did Not Attend  Day, Ashlee Nicole 06/13/2012, 9:40 AM 

## 2012-06-13 NOTE — Progress Notes (Signed)
Patient ID: Ashlee Day, female   DOB: 26-Oct-1985, 27 y.o.   MRN: [id number] Patient refusing to go to cafeteria today.  When she refused to go to dinner, she asked MHT to bring food back to her.  MHT and nurse advised her we would not bring food back to a patient that was physically able to go eat.  Patient's mother arrived after dinner and spoke to this nurse regarding patient's treatment.  I advised mother that patient was not attending groups and now is refusing to go to the cafeteria.  Patient became upset and stated, "I didn't feel well."  I informed her that she did not communicate this to staff and that she has been physically able to ambulate.  Patient stated, "I have a stuffy nose."  Patient's mother agreed that patient needs to participate more than she has.  Patient became irate and stated, "I want to go home today!"  She was informed that this was the doctor's decision and he had already advised her about her nonparticipation on the unit.  Patient turned and walked off and mother stated, "She's also seeing things too."  Patient had not communicated that information to staff; she reports voices that are telling her "I'm not pretty."

## 2012-06-13 NOTE — Clinical Social Work Note (Signed)
[facility] LCSW Group Therapy  06/13/2012  1:15 PM   Type of Therapy:  Group Therapy  Participation Level:  Did Not Attend   Chelsea Horton, LCSWA 06/13/2012 2:22 PM   

## 2012-06-14 NOTE — Clinical Social Work Note (Signed)
[facility] Group Notes:  (Counselor/Nursing/MHT/Case Management/Adjunct)  02/18/2012 12:00 PM  Type of Therapy:  Group Therapy  Participation Level:  Did Not Attend  Smart, Heather N 02/18/2012, 12:00 PM  

## 2012-06-14 NOTE — Progress Notes (Signed)
Recreation Therapy Notes  Date: 03.12.2014 Time: 9:30am Location: [address]      Group Topic/Focus: Leisure Education  Participation Level: Did not attend   [employer], LRT/CTRS    Jearl Klinefelter 06/14/2012 10:23 AM

## 2012-06-14 NOTE — Progress Notes (Signed)
D: Patient denies SI/HI and visual hallucinations and admits to auditory hallucinations that tell her she is not pretty;   A: Monitored q 15 minutes; patient encouraged to attend groups; patient educated about medications; patient given medications per physician orders; patient encouraged to express feelings and/or concerns  R: Patient is flat in affect and is unwilling to participate in groups; patient's interaction with staff and peers is isolative .; ; patient is taking medications as prescribed and tolerating medications; patient is not attending all groups

## 2012-06-14 NOTE — Progress Notes (Signed)
Pt reports she is doing fine today and that she is going to be discharged home tomorrow.  She refuses to go to groups.  She has only gone to one meal today.  At this time, she is sitting on her bed coloring, smiling as she talks with Clinical research associate.  She denies SI/HI.  She says the voices are decreasing.  At this time pt voices no needs or concerns.  Pt does make her needs known to staff.  Support/encouragement offered.  Safety maintained with q15 minute checks.

## 2012-06-14 NOTE — Progress Notes (Addendum)
Patient ID: Ashlee Day, female   DOB: 1986-01-20, 27 y.o.   MRN: [id number] [facility] MD Progress Note  06/14/2012 1:17 PM Ashlee Day  MRN:  [id number] Subjective:  "I'm doing better today." Patient reports that she is doing fine and is ready for discharge home. She says she is hearing the voices less often, "they are going away some." Objective: Ashlee Day is not going to groups as she chooses not to. She will usually state that she doesn't feel good. She is calm and cooperative today, smiles pleasantly and is engaging with this examiner. She does appear to be responding to internal stimulation today. Diagnosis: MDD severe with psychotic behavior  ADL's:  Intact  Sleep: good  Appetite:  Good  Suicidal Ideation:  denies Homicidal Ideation:  denies AEB (as evidenced by):patient's response, affect and report of reduction in symptoms.  Psychiatric Specialty Exam: Review of Systems  Constitutional: Negative.  Negative for fever, chills, weight loss, malaise/fatigue and diaphoresis.  HENT: Negative for congestion and sore throat.   Eyes: Negative for blurred vision, double vision and photophobia.  Respiratory: Negative for cough, shortness of breath and wheezing.   Cardiovascular: Negative for chest pain, palpitations and PND.  Gastrointestinal: Negative for heartburn, nausea, vomiting, diarrhea and constipation. Abdominal pain: menstrual cramps.  Musculoskeletal: Negative for myalgias, joint pain and falls.  Neurological: Negative for dizziness, tingling, tremors, sensory change, speech change, focal weakness, seizures, loss of consciousness, weakness and headaches.  Endo/Heme/Allergies: Negative for polydipsia. Does not bruise/bleed easily.  Psychiatric/Behavioral: Negative for depression, suicidal ideas, hallucinations, memory loss and substance abuse. The patient is not nervous/anxious and does not have insomnia.     Blood pressure 130/89, pulse 90, temperature 98 F (36.7 C),  temperature source Oral, resp. rate 16, height 5\' 3"  (1.6 m), weight 87.998 kg (194 lb), SpO2 95.00%.Body mass index is 34.37 kg/(m^2).  General Appearance: wearing pajamas  Eye Contact::  Fair  Speech:  Clear and Coherent  Volume:  Decreased  Mood:  Less anxious  Affect:  Appropriate  Thought Process:  linear  Orientation:  Full (Time, Place, and Person)  Thought Content:  Reports that the auditory hallucinations are decreasing  Suicidal Thoughts:  No  Homicidal Thoughts:  No  Memory:  Negative  Judgement: poor  Insight:  Lacking  Psychomotor Activity:  Increased  Concentration:  fair  Recall:  fair  Akathisia:  No  Handed:  Right  AIMS (if indicated):     Assets:  Housing Physical Health  Sleep:  Number of Hours: 5.75   Current Medications: Current Facility-Administered Medications  Medication Dose Route Frequency Provider Last Rate Last Dose  . acetaminophen (TYLENOL) tablet 650 mg  650 mg Oral Q6H PRN Verne Spurr, PA-C   650 mg at 06/09/12 2009  . alum & mag hydroxide-simeth (MAALOX/MYLANTA) 200-200-20 MG/5ML suspension 30 mL  30 mL Oral Q4H PRN Verne Spurr, PA-C   30 mL at 06/10/12 0631  . gabapentin (NEURONTIN) capsule 300 mg  300 mg Oral TID Verne Spurr, PA-C   300 mg at 06/14/12 1151  . haloperidol (HALDOL) tablet 5 mg  5 mg Oral Q6H PRN Verne Spurr, PA-C   5 mg at 06/10/12 2151  . haloperidol (HALDOL) tablet 5 mg  5 mg Oral TID Mojeed Akintayo   5 mg at 06/14/12 1151  . hydrOXYzine (ATARAX/VISTARIL) tablet 50 mg  50 mg Oral QHS PRN Verne Spurr, PA-C   50 mg at 06/13/12 2154  . ibuprofen (ADVIL,MOTRIN) tablet 400 mg  400  mg Oral Q6H PRN Mickeal Skinner, MD   400 mg at 06/13/12 1942  . lamoTRIgine (LAMICTAL) tablet 200 mg  200 mg Oral QHS Verne Spurr, PA-C   200 mg at 06/13/12 2151  . magnesium hydroxide (MILK OF MAGNESIA) suspension 30 mL  30 mL Oral Daily PRN Verne Spurr, PA-C      . naproxen (NAPROSYN) tablet 500 mg  500 mg Oral BID WC Verne Spurr, PA-C    500 mg at 06/14/12 0746  . norethindrone-ethinyl estradiol (MICROGESTIN,JUNEL,LOESTRIN) 1-20 MG-MCG tablet 1 tablet  1 tablet Oral Daily Verne Spurr, PA-C   1 tablet at 06/14/12 0747  . trihexyphenidyl (ARTANE) tablet 5 mg  5 mg Oral BID WC Mojeed Akintayo   5 mg at 06/14/12 0746  . venlafaxine XR (EFFEXOR-XR) 24 hr capsule 300 mg  300 mg Oral Daily Verne Spurr, PA-C   300 mg at 06/14/12 6213    Lab Results:  No results found for this or any previous visit (from the past 48 hour(s)).  Physical Findings: AIMS: Facial and Oral Movements Muscles of Facial Expression: None, normal Lips and Perioral Area: None, normal Jaw: None, normal Tongue: None, normal,Extremity Movements Upper (arms, wrists, hands, fingers): None, normal Lower (legs, knees, ankles, toes): None, normal, Trunk Movements Neck, shoulders, hips: None, normal, Overall Severity Severity of abnormal movements (highest score from questions above): None, normal Incapacitation due to abnormal movements: None, normal Patient's awareness of abnormal movements (rate only patient's report): No Awareness, Dental Status Current problems with teeth and/or dentures?: No Does patient usually wear dentures?: No  CIWA:    COWS:     Treatment Plan Summary: Daily contact with patient to assess and evaluate symptoms and progress in treatment Medication management  Plan: 1. Continue crisis management and stabilization. 2. Medication management to reduce current symptoms to base line and improve patient's overall level of functioning 3. Treat health problems as indicated. 4. Develop treatment plan to decrease risk of relapse upon discharge and the need for readmission. 5. Psycho-social education regarding relapse prevention and self care. 6. Health care follow up as needed for medical problems. 7. Continue home medications where appropriate. 8. Will continue current medication regimen. 9. ELOS 1-2 days. Medical Decision  Making Problem Points:  Established problem, stable/improving (1) Data Points:  Review of medication regiment & side effects (2)  I certify that inpatient services furnished can reasonably be expected to improve the patient's condition.   Rona Ravens. Mashburn RPAC 1:22 PM 06/14/2012

## 2012-06-14 NOTE — Progress Notes (Signed)
[facility] LCSW Aftercare Discharge Planning Group Note  06/14/2012 10:46 AM  Participation Quality: DID NOT ATTEND   SW intern met with pt individually. Received consent to speak with pt's mother regarding suicide prevention strategies. Pt stated that the doctor met with him and said that she is set for discharge Thursday. She will follow-up with Jorje Guild at [facility]. She reported that she is in a good mood today but that she is physically not feeling well today.   Smart, Heather N 06/14/2012, 10:46 AM
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
## 2012-06-15 NOTE — Progress Notes (Signed)
Patient ID: Ashlee Day, female   DOB: 1985/08/01, 27 y.o.   MRN: [id number] [facility] MD Progress Note  06/15/2012 12:30 PM Ashlee Day  MRN:  [id number] Subjective: "I'm going home today right?" Met with the patient 1:1 to discuss treatment response. She states she has not gone to groups because she has menses cramps. She denies menstruating. Ashlee Day states her pain is an 8/10. "Dr. Mervyn Skeeters told me yesterday I was going home today."  Objective: Ashlee Day is not going to groups as she chooses not to. Her reason for not attending groups is cramps. She is sitting calmly and comfortably in the bed and appears in no apparent distress. She is cooperative, makes fair eye contact, but reports that her "voices are worse when the pain gets bad." But she denies all other symptoms.  Diagnosis: MDD severe with psychotic behavior                     BPD ADL's:  Intact  Sleep: poor Patient reports multiple night mares.  Appetite:  Good  Suicidal Ideation:  denies Homicidal Ideation:  denies AEB (as evidenced by):patient's response, affect and report of reduction in symptoms.  Psychiatric Specialty Exam: Review of Systems  Constitutional: Negative.  Negative for fever, chills, weight loss, malaise/fatigue and diaphoresis.  HENT: Negative for congestion and sore throat.   Eyes: Negative for blurred vision, double vision and photophobia.  Respiratory: Negative for cough, shortness of breath and wheezing.   Cardiovascular: Negative for chest pain, palpitations and PND.  Gastrointestinal: Positive for abdominal pain (menstrual cramps). Negative for heartburn, nausea, vomiting, diarrhea and constipation.  Musculoskeletal: Negative for myalgias, joint pain and falls.  Neurological: Negative for dizziness, tingling, tremors, sensory change, speech change, focal weakness, seizures, loss of consciousness, weakness and headaches.  Endo/Heme/Allergies: Negative for polydipsia. Does not bruise/bleed easily.   Psychiatric/Behavioral: Negative for depression, suicidal ideas, hallucinations, memory loss and substance abuse. The patient is not nervous/anxious and does not have insomnia.     Blood pressure 135/93, pulse 84, temperature 97.7 F (36.5 C), temperature source Oral, resp. rate 14, height 5\' 3"  (1.6 m), weight 87.998 kg (194 lb), SpO2 95.00%.Body mass index is 34.37 kg/(m^2).  General Appearance: wearing pajamas  Eye Contact::  Fair  Speech:  Clear and Coherent  Volume:  Decreased  Mood:  Less anxious  Affect:  Appropriate  Thought Process:  linear  Orientation:  Full (Time, Place, and Person)  Thought Content:  Reports that the auditory hallucinations are affected by the level of pain that she has.  Suicidal Thoughts:  No  Homicidal Thoughts:  No  Memory:  Negative  Judgement: poor  Insight:  Lacking  Psychomotor Activity:  Increased  Concentration:  fair  Recall:  fair  Akathisia:  No  Handed:  Right  AIMS (if indicated):     Assets:  Housing Physical Health  Sleep:  Number of Hours: 5.25   Current Medications: Current Facility-Administered Medications  Medication Dose Route Frequency Provider Last Rate Last Dose  . acetaminophen (TYLENOL) tablet 650 mg  650 mg Oral Q6H PRN Verne Spurr, PA-C   650 mg at 06/09/12 2009  . alum & mag hydroxide-simeth (MAALOX/MYLANTA) 200-200-20 MG/5ML suspension 30 mL  30 mL Oral Q4H PRN Verne Spurr, PA-C   30 mL at 06/10/12 0631  . gabapentin (NEURONTIN) capsule 300 mg  300 mg Oral TID Verne Spurr, PA-C   300 mg at 06/14/12 1151  . haloperidol (HALDOL) tablet 5 mg  5  mg Oral Q6H PRN Verne Spurr, PA-C   5 mg at 06/10/12 2151  . haloperidol (HALDOL) tablet 5 mg  5 mg Oral TID Diana Davenport   5 mg at 06/14/12 1151  . hydrOXYzine (ATARAX/VISTARIL) tablet 50 mg  50 mg Oral QHS PRN Verne Spurr, PA-C   50 mg at 06/13/12 2154  . ibuprofen (ADVIL,MOTRIN) tablet 400 mg  400 mg Oral Q6H PRN Mickeal Skinner, MD   400 mg at 06/13/12 1942  .  lamoTRIgine (LAMICTAL) tablet 200 mg  200 mg Oral QHS Verne Spurr, PA-C   200 mg at 06/13/12 2151  . magnesium hydroxide (MILK OF MAGNESIA) suspension 30 mL  30 mL Oral Daily PRN Verne Spurr, PA-C      . naproxen (NAPROSYN) tablet 500 mg  500 mg Oral BID WC Verne Spurr, PA-C   500 mg at 06/14/12 0746  . norethindrone-ethinyl estradiol (MICROGESTIN,JUNEL,LOESTRIN) 1-20 MG-MCG tablet 1 tablet  1 tablet Oral Daily Verne Spurr, PA-C   1 tablet at 06/14/12 0747  . trihexyphenidyl (ARTANE) tablet 5 mg  5 mg Oral BID WC Antwonette Feliz   5 mg at 06/14/12 0746  . venlafaxine XR (EFFEXOR-XR) 24 hr capsule 300 mg  300 mg Oral Daily Verne Spurr, PA-C   300 mg at 06/14/12 0454    Lab Results:  No results found for this or any previous visit (from the past 48 hour(s)).  Physical Findings: AIMS: Facial and Oral Movements Muscles of Facial Expression: None, normal Lips and Perioral Area: None, normal Jaw: None, normal Tongue: None, normal,Extremity Movements Upper (arms, wrists, hands, fingers): None, normal Lower (legs, knees, ankles, toes): None, normal, Trunk Movements Neck, shoulders, hips: None, normal, Overall Severity Severity of abnormal movements (highest score from questions above): None, normal Incapacitation due to abnormal movements: None, normal Patient's awareness of abnormal movements (rate only patient's report): No Awareness, Dental Status Current problems with teeth and/or dentures?: No Does patient usually wear dentures?: No  CIWA:    COWS:     Treatment Plan Summary: Daily contact with patient to assess and evaluate symptoms and progress in treatment Medication management  Plan: 1. Continue crisis management and stabilization. 2. Medication management to reduce current symptoms to base line and improve patient's overall level of functioning 3. Treat health problems as indicated. 4. Develop treatment plan to decrease risk of relapse upon discharge and the need for  readmission. 5. Psycho-social education regarding relapse prevention and self care. 6. Health care follow up as needed for medical problems. 7. Continue home medications where appropriate. 8. Will continue current medication regimen. 9. ELOS 1- will plan to discharge on Friday as planned. 10. [place] is encouraged to attend all groups today.   Medical Decision Making Problem Points:  Established problem, stable/improving (1) Data Points:  Review of medication regiment & side effects (2)  I certify that inpatient services furnished can reasonably be expected to improve the patient's condition.   Rona Ravens. Mashburn RPAC 12:30 PM 06/15/2012

## 2012-06-15 NOTE — Clinical Social Work Note (Signed)
[facility] Group Notes:  (Counselor/Nursing/MHT/Case Management/Adjunct)  06/15/2012 2:13 PM   Type of Therapy:  Group Therapy  Participation Level:  Active  Participation Quality:  Appropriate  Affect:  Blunted  Cognitive:  Disorganized  Insight:  Lacking  Engagement in Group:  Improving  Engagement in Therapy:  Improving  Modes of Intervention:  Confrontation, Discussion, Education, Socialization and Support  Summary of Progress/Problems: Topic-Balance: The topic for group was balance in life. Pt participated in the discussion about when their life was in balance and out of balance and how this feels. Pt discussed ways to get back in balance and short term goals they can work on to get where they want to be. Ashlee Day listened intently as others talked about the subject of balance in group. She only participated when called on directly but openly shared when prompted. Ashlee Day named her mother as her primary support and stated that she can rely on her for anything. Ashlee Day also stated that having a clean living space and bedroom helps her feel balanced. She listed: not taking meds properly, not managing her physical pain and health, surrounding herself with bad people, and isolating as things that throw her off balance and discussed ways to avoid doing these.     Smart, Heather N 02/17/2012, 2:34 PM

## 2012-06-15 NOTE — Progress Notes (Signed)
Psychoeducational Group Note  Date:  06/15/2012 Time:  1100  Group Topic/Focus:  Personal Choices and Values:   The focus of this group is to help patients assess and explore the importance of values in their lives, how their values affect their decisions, how they express their values and what opposes their expression.  Participation Level: Did Not Attend  Participation Quality:  Not Applicable  Affect:  Not Applicable  Cognitive:  Not Applicable  Insight:  Not Applicable  Engagement in Group: Not Applicable  Additional Comments:  Patient did not attend group, and remained in bed.  Karleen Hampshire Brittini 06/15/2012, 11:23 AM

## 2012-06-15 NOTE — Progress Notes (Signed)
[facility] INPATIENT:  Family/Significant Other Suicide Prevention Education  Suicide Prevention Education:  Education Completed: Ashlee Day (mother) has been identified by the patient as the family member/significant other with whom the patient will be residing, and identified as the person(s) who will aid the patient in the event of a mental health crisis (suicidal ideations/suicide attempt).  With written consent from the patient, the family member/significant other has been provided the following suicide prevention education, prior to the and/or following the discharge of the patient.  The suicide prevention education provided includes the following:  Suicide risk factors  Suicide prevention and interventions  National Suicide Hotline telephone number  [facility] assessment telephone number  [facility] Emergency Assistance 911  [facility] and/or Residential Mobile Crisis Unit telephone number  Request made of family/significant other to:  Remove weapons (e.g., guns, rifles, knives), all items previously/currently identified as safety concern.    Remove drugs/medications (over-the-counter, prescriptions, illicit drugs), all items previously/currently identified as a safety concern.  The family member/significant other verbalizes understanding of the suicide prevention education information provided.  The family member/significant other agrees to remove the items of safety concern listed above.  Smart, Heather N 06/15/2012, 10:20 AM

## 2012-06-15 NOTE — Progress Notes (Signed)
D:  Patient out of her room a bit more today.  Attended group this morning.  Was quiet, but she was respectful of others while they were sharing.  Aware that she is not going home until tomorrow and states she is okay with that.  Patient did not want to go down for meals, but when she was told she would not have a tray brought back, she got up and went with the group.  Rates depression at 5 and hopelessness at 4.  Denies suicidal ideation. A:  Medications given as per orders.  Encouraged patient to be up and attending all groups.  Patient was also informed that she needed to go to the cafeteria in order to get her meals.  R:  Quiet, but pleasant.  Minimal interaction with peers, but is out of her room more today.

## 2012-06-15 NOTE — Progress Notes (Signed)
[facility] LCSW Aftercare Discharge Planning Group Note  06/15/2012 10:15 AM  Participation Quality:  Appropriate  Affect:  Depressed  Cognitive:  Disorganized  Insight:  Lacking  Engagement in Group:  Engaged  Modes of Intervention:  Discussion, Exploration, Problem-solving, Socialization and Support  Summary of Progress/Problems: Ashlee Day was under the impression that she is discharging today. She is positive for AH, with no VH, SI, HI. She reports depression as 5 and anxiety as 6. She reports that her physical pain is high today, which tends to increase AH. Doctor stated that possible discharge date will be Friday.   Smart, Heather N 06/15/2012, 10:15 AM

## 2012-06-15 NOTE — Progress Notes (Signed)
Psychoeducational Group Note  Date:  06/15/2012 Time:  0930  Group Topic/Focus:  Self Esteem Action Plan:   The focus of this group is to help patients create a plan to continue to build self-esteem after discharge.  Participation Level: Did Not Attend  Participation Quality:  Not Applicable  Affect:  Not Applicable  Cognitive:  Not Applicable  Insight:  Not Applicable  Engagement in Group: Not Applicable  Additional Comments:  Pt refused to attend group this morning.  [name], JOEL E 06/15/2012, 5:22 PM

## 2012-06-16 DIAGNOSIS — F603 Borderline personality disorder: Secondary | ICD-10-CM

## 2012-06-16 DIAGNOSIS — F607 Dependent personality disorder: Secondary | ICD-10-CM

## 2012-06-16 MED ORDER — LAMOTRIGINE 200 MG PO TABS: 200.0000 mg | ORAL_TABLET | Freq: Every day | ORAL | Status: DC

## 2012-06-16 MED ORDER — VITAMIN D 1000 UNITS PO TABS: 1000.0000 [IU] | ORAL_TABLET | Freq: Every day | ORAL | Status: DC

## 2012-06-16 MED ORDER — TRIHEXYPHENIDYL HCL 5 MG PO TABS: 5.0000 mg | ORAL_TABLET | Freq: Two times a day (BID) | ORAL | Status: DC

## 2012-06-16 MED ORDER — NAPROXEN 500 MG PO TABS: 500.0000 mg | ORAL_TABLET | Freq: Two times a day (BID) | ORAL | Status: DC

## 2012-06-16 MED ORDER — HALOPERIDOL 5 MG PO TABS: 5.0000 mg | ORAL_TABLET | Freq: Three times a day (TID) | ORAL | Status: DC

## 2012-06-16 MED ORDER — VITAMIN D3 25 MCG (1000 UNIT) PO TABS
1000.0000 [IU] | ORAL_TABLET | Freq: Every day | ORAL | Status: DC
  Filled 2012-06-16: qty 14

## 2012-06-16 MED ORDER — VENLAFAXINE HCL ER 150 MG PO CP24: 300.0000 mg | ORAL_CAPSULE | Freq: Every day | ORAL | Status: DC

## 2012-06-16 NOTE — Progress Notes (Signed)
Adult Psychoeducational Group Note  Date:  06/16/2012 Time:  2:19 PM  Group Topic/Focus:  Relapse Prevention Planning:   The focus of this group is to define relapse and discuss the need for planning to combat relapse.  Participation Level:  Minimal  Participation Quality:  Drowsy and Inattentive  Affect:  Flat and Lethargic  Cognitive:  Appropriate  Insight: Lacking  Engagement in Group:  Limited  Modes of Intervention:  Clarification, Discussion and Education  Additional Comments:  Pt was unable to identify behaviors which she would benefit from changing. Pt also had difficulty acknowledging the she exhibited issues from which she needed to prevent relapse. Staff explored why pt was hospitalized, and pt shared that she hears voices. Pt completed her relapse prevention plan with regard to her auditory hallucinations. Pt verbalized that she would take her meds as directed in order to prevent relapse in the future.   Reinaldo Raddle K 06/16/2012, 2:19 PM

## 2012-06-16 NOTE — Progress Notes (Signed)
Patient ID: Ashlee Day, female   DOB: Dec 21, 1985, 27 y.o.   MRN: [id number] D: Patient mood/affect is depressed and anxious. Pt interaction is childlike. Pt denies SI/HI. Pt continuous  having visual and auditory hallucinations.  Pt stated the voices are telling her "she is not beautiful". Mother visited pt today. Pt attended evening wrap up group and interacted well with peers. Pt cooperative with my assessment. No acute distressed noted at this time.   A: Met with pt 1:1. Medications administered as prescribed.  Spoke to pt and mother  about pt's medication. Writer encouraged pt to discuss feelings and attend groups. Pt encouraged to come to staff with any question or concerns.   R: Patient remains safe. She is complaint with medications and group programming. Pt stated the voices are minimized. Continue current POC.

## 2012-06-16 NOTE — Progress Notes (Signed)
Pt discharged per MD orders; pt currently denies SI/HI and visual hallucinations; pt reports still having auditory hallucinations but states that they are "less than before;" pt was given education by RN regarding follow-up appointments and medications and pt denied any questions or concerns about these instructions; pt was then escorted to search room to retrieve her belongings by RN before being discharged to hospital lobby.

## 2012-06-16 NOTE — Discharge Summary (Signed)
Physician Discharge Summary Note  Patient:  Ashlee Day is an 27 y.o., female MRN:  [id number] DOB:  05-22-1985 Patient phone:  [phone] (home)  Patient address:   [address] [place] [address],   Date of Admission:  06/08/2012 Date of Discharge: 06/16/2012  Reason for Admission:  Psychosis  Discharge Diagnoses: Principal Problem:   Borderline personality disorder Active Problems:   Bipolar disorder   Major depressive disorder, recurrent episode, severe, specified as with psychotic behavior   Polypharmacy   Drug-seeking behavior   Dependent personality disorder   Unspecified episodic mood disorder Discharge Diagnoses:  AXIS I: Major depressive disorder-recurrent episode severe with psychosis.  AXIS II: Borderline Personality Dis. and Dependent Personality  AXIS III:  Past Medical History   Diagnosis  Date   .  Fibromyalgia    .  Ileus      x3-4   .  Hashimoto's thyroiditis      2005ish   .  Hyperparathyroidism, unspecified    .  Kidney stone      2013   .  GERD (gastroesophageal reflux disease)    .  Migraines      occasional aura   .  Partial edentulism    .  Torticollis    AXIS IV: other psychosocial or environmental problems and problems related to social environment  AXIS V: 61-70 mild symptoms Review of Systems  Constitutional: Negative.  Negative for fever, chills, weight loss, malaise/fatigue and diaphoresis.  HENT: Negative for congestion and sore throat.   Eyes: Negative for blurred vision, double vision and photophobia.  Respiratory: Negative for cough, shortness of breath and wheezing.   Cardiovascular: Negative for chest pain, palpitations and PND.  Gastrointestinal: Negative for heartburn, nausea, vomiting, abdominal pain, diarrhea and constipation.  Musculoskeletal: Negative for myalgias, joint pain and falls.  Neurological: Negative for dizziness, tingling, tremors, sensory change, speech change, focal weakness, seizures, loss of  consciousness, weakness and headaches.  Endo/Heme/Allergies: Negative for polydipsia. Does not bruise/bleed easily.  Psychiatric/Behavioral: Negative for depression, suicidal ideas, hallucinations, memory loss and substance abuse. The patient is not nervous/anxious and does not have insomnia.    Level of Care:  OP  Hospital Course:  Chinmayi was admitted after she made a scene in the out patient department. She presented after multiple phone calls to her provider demanding medication. She was advised to go to the ED if she felt she was in crisis. Her mother brought her to [facility]. On evaluation Andjela was noted to be agitated and aggressive especially toward her mother.  Her mother reported that Raejean had gotten out of the car several days earlier and began walking down the street looking for a bar.  Yaritzy had to be coaxed back into the car.  Mrs. Cortese reported that Kynsli's behavior had continued to deteriorate since she was discharged the previous week from [facility].      A psychiatrist evaluated Adream and felt that she would be safe to go home, but Taneka refused to cooperate and she was admitted.     On evaluation on the in patient unit her home medications were decreased from Haldol 10mg  TID to 5mg  po TID.  Kolbi was agitated, anxious, resistant and demanding of medication. She refused to attend groups and isolated to her room.  On the second day of her admission she became verbally aggressive cursing a provider, screaming and using profanity. Anasofia demanded that she be allowed to have her clay in the hospital as well  as her stuffed animal to help her sleep. When this was declined she escalated. She went to the quiet room without incident to allow her to calm down. Palmira was not on a 1:1.  She was evaluated each day by a clinical provider to assess her response to treatment and medication management.  Each day she requested a change in medication and reported new symptoms that were incongruent with her  presentation and unsupported by examination. Sita stayed in bed through meals and asked for a tray to be brought to her room. When this was declined she became agitated but did not escalate. Each day Shabria asked to go home and called her mother several times a day stating that she was going home.      Mrs. Figiel spoke with the staff often, sometimes several times a day, but was reassured that Glyn was not going to be discharged until Friday as planned.  Mrs. Arvizu voiced concern to staff that she would like Fryda "cured" while she was in the hospital, as she did not feel that Brienna could come home in her current state.  The majority of Margee's problems were behavioral as she has poor boundaries and poor tolerance for delayed gratification.       As these problems are behavioral and unlikely to be "cured" in an acute hospital setting a discharge date was planned. On the day of discharge Delva denied SI/HI reported that she had no AVH and was in full contact with reality.  She was in stable condition and met no criteria for continued in patient stay.  She was discharged home. Consults:  None  Significant Diagnostic Studies:  None  Discharge Vitals:   Blood pressure 135/93, pulse 84, temperature 97.7 F (36.5 C), temperature source Oral, resp. rate 14, height 5\' 3"  (1.6 m), weight 87.998 kg (194 lb), SpO2 95.00%. Body mass index is 34.37 kg/(m^2). Lab Results:   No results found for this or any previous visit (from the past 72 hour(s)).  Physical Findings: AIMS: Facial and Oral Movements Muscles of Facial Expression: None, normal Lips and Perioral Area: None, normal Jaw: None, normal Tongue: None, normal,Extremity Movements Upper (arms, wrists, hands, fingers): None, normal Lower (legs, knees, ankles, toes): None, normal, Trunk Movements Neck, shoulders, hips: None, normal, Overall Severity Severity of abnormal movements (highest score from questions above): None, normal Incapacitation  due to abnormal movements: None, normal Patient's awareness of abnormal movements (rate only patient's report): No Awareness, Dental Status Current problems with teeth and/or dentures?: No Does patient usually wear dentures?: No  CIWA:    COWS:     Psychiatric Specialty Exam: See Psychiatric Specialty Exam and Suicide Risk Assessment completed by Attending Physician prior to discharge.  Discharge destination:  Home  Is patient on multiple antipsychotic therapies at discharge:  No   Has Patient had three or more failed trials of antipsychotic monotherapy by history:  No  Recommended Plan for Multiple Antipsychotic Therapies: Not applicable  Discharge Orders   Future Orders Complete By Expires     Diet - low sodium heart healthy  As directed     Discharge instructions  As directed     Comments:      Take all of your medications as directed. Be sure to keep all of your follow up appointments.  If you are unable to keep your follow up appointment, call your Doctor's office to let them know, and reschedule.  Make sure that you have enough medication to last until your  appointment. Be sure to get plenty of rest. Going to bed at the same time each night will help. Try to avoid sleeping during the day.  Increase your activity as tolerated. Regular exercise will help you to sleep better and improve your mental health. Eating a heart healthy diet is recommended. Try to avoid salty or fried foods. Be sure to avoid all alcohol and illegal drugs.    Increase activity slowly  As directed         Medication List    STOP taking these medications       benztropine 1 MG tablet  Commonly known as:  COGENTIN     calcium carbonate 600 MG Tabs  Commonly known as:  OS-CAL     hydrOXYzine 50 MG tablet  Commonly known as:  ATARAX/VISTARIL     zonisamide 100 MG capsule  Commonly known as:  ZONEGRAN      TAKE these medications     Indication   cholecalciferol 1000 UNITS tablet  Commonly known  as:  VITAMIN D  Take 1 tablet (1,000 Units total) by mouth daily. For nutritional supplement.      gabapentin 300 MG capsule  Commonly known as:  NEURONTIN  Take 1 capsule (300 mg total) by mouth 3 (three) times daily. For anxiety and panic   Indication:  Agitation     haloperidol 5 MG tablet  Commonly known as:  HALDOL  Take 1 tablet (5 mg total) by mouth 3 (three) times daily. For mental clarity and psychosis.   Indication:  Psychosis, Severe Problems with Behavior     lamoTRIgine 200 MG tablet  Commonly known as:  LAMICTAL  Take 1 tablet (200 mg total) by mouth at bedtime. For mood lability.   Indication:  Manic-Depression     naproxen 500 MG tablet  Commonly known as:  NAPROSYN  Take 1 tablet (500 mg total) by mouth 2 (two) times daily with a meal. For pain.   Indication:  Mild to Moderate Pain     norethindrone-ethinyl estradiol 1-20 MG-MCG tablet  Commonly known as:  MICROGESTIN,JUNEL,LOESTRIN  Take 1 tablet by mouth daily.      trihexyphenidyl 5 MG tablet  Commonly known as:  ARTANE  Take 1 tablet (5 mg total) by mouth 2 (two) times daily with a meal.   Indication:  Extrapyramidal Reaction caused by Medications     venlafaxine XR 150 MG 24 hr capsule  Commonly known as:  EFFEXOR-XR  Take 2 capsules (300 mg total) by mouth daily. For anxiety and depression.   Indication:  Generalized Anxiety Disorder, Major Depressive Disorder           Follow-up Information   Follow up with [facility] On 07/25/2012. (Appointment scheduled at 1:15 pm with Jorje Guild, PA for medication management)    Contact information:   [address] [place], [place] [address] [phone]                   [facility] [phone] [place], [place]  [address] [phone]  Follow-up recommendations:  Activities: Resume activity as tolerated. Diet: Heart healthy low sodium diet Tests: Follow up testing at [facility]  for further psychological testing is recommended.  If you have not already scheduled an appointment please do so at your earliest convenience.  Comments:  It is strongly recommended that if Parneet should require hospital admission in the future that she be held in the ED and placed  on the [facility] waitlist.  This will be communicated to both [place] and her mother upon discharge. Jorje Guild [facility] at [facility], Dr. Melida Gimenez in [place], Dr. Allena Katz at [facility] in [facility], and Dr. Rip Harbour at [facility] health in [facility] will be provided copies of this discharge summary and recommendations.  Total Discharge Time:  Greater than 30 minutes.  Signed: Mojeed Akintayo,MD 12:03 PM 06/16/2012

## 2012-06-16 NOTE — Progress Notes (Signed)
[facility] Adult Case Management Discharge Plan :  Will you be returning to the same living situation after discharge: Yes,  home with mother At discharge, do you have transportation home?:Yes,  mother Do you have the ability to pay for your medications:Yes,  [employer] insurance  Release of information consent forms completed and in the chart;  Patient's signature needed at discharge.  Patient to Follow up at: Follow-up Information   Follow up with [facility] On 07/25/2012. (Appointment scheduled at 1:15 pm with Jorje Guild, PA for medication management)    Contact information:   [address] [place], [place] [address] [phone]      Patient denies SI/HI:   Yes,  in group    Safety Planning and Suicide Prevention discussed:  Yes,  with both patient and her mother.   Smart, Heather N 06/16/2012, 10:20 AM

## 2012-06-16 NOTE — Tx Team (Signed)
Interdisciplinary Treatment Plan Update   Date Reviewed:  06/16/2012  Time Reviewed:  11:17 AM  Progress in Treatment:   Attending groups: Yes Participating in groups: Yes Taking medication as prescribed: Yes  Tolerating medication: Yes Family/Significant other contact made: Yes and family session scheduled for 3/14 with mom Patient understands diagnosis: Yes  Discussing patient identified problems/goals with staff: Yes Medical problems stabilized or resolved: Yes Denies suicidal/homicidal ideation: Yes Patient has not harmed self or others: Yes For review of initial/current patient goals, please see plan of care.  Estimated Length of Stay:  3/14  Reasons for Continued Hospitalization:  Patient has met all goals and stable to DC home.  New Problems/Goals identified: None   Discharge Plan or Barriers:   After care in place.  Additional Comments:  Mother wanted family session to discuss family/indivudal support groups for patient that are available in the community. Patient refused individual therapy to be arranged by LCSW  Attendees:  Signature: Patient: Ashlee Day 06/16/2012 11:17 AM   Signature: Dr. Jannifer Franklin, MD 06/16/2012 11:17 AM  Signature: 06/16/2012 11:17 AM  Signature: Ashley Jacobs, LCSW 06/16/2012 11:17 AM  Signature:  06/16/2012 11:17 AM  Signature: Trula Slade, MSW 06/16/2012 11:17 AM  Signature:   06/16/2012 11:17 AM  Signature:    Signature:    Signature:    Signature:    Signature:    Signature:      Scribe for Treatment Team:  Trula Slade, MSW intern Lorenza Chick, Catalina Gravel,   06/16/2012 11:17 AM

## 2012-06-16 NOTE — Progress Notes (Signed)
Recreation Therapy Notes  Date: 03.14.2014  Time: 9:30am  Location: [address] Room   Group Topic/Focus: Leisure Education   Participation Level:  Active   Participation Quality:  Appropriate   Affect:  Flat   Cognitive:  Oriented   Additional Comments: Patient played adapted Apples to Apples. Patient was asked to select one green card and two red cards. Using the selected cards patients were asked to make a sentence out of the card in their hand relating to leisure and recreation. Patient participated in two rounds. The first round patient selected the following cards:Flat, High School, Honey. Patient was able to make the following sentence:"At high school I like to eat honey because it was flat on my tongue." The second round the patient selected the following cards: A Leaking Boat, A Crown, Frightening. The patient was able to make the following sentence: "I was very frightened on a leaky boat wearing a crown." Patient related her first sentence to recreation and leisure because she likes to eat. Patient related the second sentence to recreation because she was on a boat. Patient stated she feels "excited" when she plays games. Patient was able to link recreation and leisure and coping with depression.   Marykay Lex Margaretta Chittum, LRT/CTRS   Jearl Klinefelter 06/16/2012 12:37 PM

## 2012-06-16 NOTE — Progress Notes (Signed)
[facility] LCSW Aftercare Discharge Planning Group Note  06/16/2012 10:16 AM  Participation Quality:  Appropriate  Affect:  Appropriate  Cognitive:  Appropriate  Insight:  Improving  Engagement in Group:  Improving  Modes of Intervention:  Confrontation, Discussion, Problem-solving, Socialization and Support  Summary of Progress/Problems: Ashlee Day stated that her mother came for dinner last night and they had a nice visit. She is excited to be discharging today and was made aware that her mother had requested a family session with her and SW intern prior to discharge. Pt agreed to this. Pt presented with group/individual/family counseling options after discharge through both [facility] and [facility]. Pt stated that she will call her mother to let her know time of discharge. Pt denied AVH, SI, and HI.   Smart, Heather N 06/16/2012, 10:16 AM

## 2012-06-16 NOTE — [facility] Suicide Risk Assessment (Signed)
Suicide Risk Assessment  Discharge Assessment     Demographic Factors:  Low socioeconomic status and Unemployed  Mental Status Per Nursing Assessment::   On Admission:     Current Mental Status by Physician: patient denies suicidal ideation, intent or plan  Loss Factors: Decrease in vocational status, Decline in physical health and Financial problems/change in socioeconomic status  Historical Factors: Impulsivity  Risk Reduction Factors:   Living with another person, especially a relative, Positive social support and Positive therapeutic relationship  Continued Clinical Symptoms:  Resolving psychosis  Cognitive Features That Contribute To Risk:  Closed-mindedness Polarized thinking    Suicide Risk:  Minimal: No identifiable suicidal ideation.  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    AXIS II:  Borderline Personality Dis. and Dependent Personality AXIS III:   Past Medical History  Diagnosis Date  . Fibromyalgia   . Ileus     x3-4  . Hashimoto's thyroiditis     2005ish  . Hyperparathyroidism, unspecified   . Kidney stone     2013  . GERD (gastroesophageal reflux disease)   . Migraines     occasional aura  . Partial edentulism   . Torticollis    AXIS IV:  other psychosocial or environmental problems and problems related to social environment AXIS V:  61-70 mild symptoms  Plan Of Care/Follow-up recommendations:  Activity:  as tolerated Diet:  healthy Tests:  routine Other:  patient to keep her after care appointment.  Is patient on multiple antipsychotic therapies at discharge:  No   Has Patient had three or more failed trials of antipsychotic monotherapy by history:  No  Recommended Plan for Multiple Antipsychotic Therapies: N/A  Akintayo,  Mojeed,MD 06/16/2012, 9:45 AM

## 2012-06-21 ENCOUNTER — Telehealth (HOSPITAL_COMMUNITY): Payer: Self-pay | Admitting: *Deleted

## 2012-06-21 NOTE — Progress Notes (Signed)
Patient Discharge Instructions:  Next Level Care Provider Has Access to the EMR, 06/21/12 Records provided to [facility] via [facility]/E[facility] Access.  Jerelene Redden, 06/21/2012, 11:34 AM

## 2012-06-21 NOTE — Telephone Encounter (Signed)
Attempted to contact pt to inform provider out of office and will return Monday 3/24. Can be placed on cancellation list if earlier appt appt desired.  Mother states Ashlee Day did not inform inpatient staff of all her concerns while admitted due to fear of staying in hospital longer. Now patient is afraid (per mother) of being sent to [facility] if she comes to [facility] again, due to something she was told while in the hospital.  Mother states the only person pt wants to see or talk to is  "Ashlee Day". Asked mother to give pt information regarding provider's absence and being placed on cancellation list.Informed mother that information from this call would be sent to provider's "In Basket"

## 2012-07-03 NOTE — Progress Notes (Signed)
   [facility] Follow-up Outpatient Visit  [name] 02/18/1986  Date: 05/31/2012   Subjective: Ashlee Day presents today with her mother to followup on her treatment for depression and anxiety. She was just discharged from the behavioral health inpatient unit earlier today. She complains that she is extremely depressed, and she is having auditory hallucinations of voices giving her derogatory remarks. She also reports she is having trouble sleeping, but the Haldol helps. She denies any suicidal or homicidal ideation. Mother reports that she is a lot better than before she went to [facility].  There were no vitals filed for this visit.  Mental Status Examination  Appearance: Somewhat bizarre Alert: Yes Attention: fair  Cooperative: Yes Eye Contact: Fair Speech: Clear and coherent Psychomotor Activity: Normal Memory/Concentration: Intact Oriented: person, place, time/date and situation Mood: Anxious Affect: Congruent Thought Processes and Associations: Logical Fund of Knowledge: Fair Thought Content: Auditory hallucinations Insight: Poor Judgement: Fair  Diagnosis: Maj. depressive disorder with psychotic features  Treatment Plan: We'll continue her medications per her discharge from inpatient, in spite of [name]'s desire to have been changed. I explained that she was released as stable, and we may begin this medication regimen time before making changes. I gave [name] a pamphlet on the [facility] at [facility] [place] and asked her to get enrolled to take classes there. I expect her to be more proactive in her treatment of her mental illness. She will return for followup at my next available appointment in approximately 2 months. She is encouraged to call if there are further problems. If further problems occur, but will likely require another inpatient hospitalization.  Ashlee Hartin, PA-C

## 2012-07-06 ENCOUNTER — Other Ambulatory Visit (HOSPITAL_COMMUNITY): Payer: Self-pay | Admitting: *Deleted

## 2012-07-06 MED ORDER — GABAPENTIN 300 MG PO CAPS: 300.0000 mg | ORAL_CAPSULE | Freq: Three times a day (TID) | ORAL | Status: DC

## 2012-07-18 ENCOUNTER — Encounter (HOSPITAL_COMMUNITY): Payer: Self-pay | Admitting: *Deleted

## 2012-07-18 ENCOUNTER — Emergency Department (HOSPITAL_COMMUNITY)
Admission: EM | Admit: 2012-07-18 | Discharge: 2012-07-18 | Disposition: A | Discharge status: Home / Self Care | Payer: No Typology Code available for payment source | Attending: Emergency Medicine | Admitting: Emergency Medicine

## 2012-07-18 DIAGNOSIS — Z87442 Personal history of urinary calculi: Secondary | ICD-10-CM | POA: Insufficient documentation

## 2012-07-18 DIAGNOSIS — G43909 Migraine, unspecified, not intractable, without status migrainosus: Secondary | ICD-10-CM | POA: Insufficient documentation

## 2012-07-18 DIAGNOSIS — Z862 Personal history of diseases of the blood and blood-forming organs and certain disorders involving the immune mechanism: Secondary | ICD-10-CM | POA: Insufficient documentation

## 2012-07-18 DIAGNOSIS — Z8739 Personal history of other diseases of the musculoskeletal system and connective tissue: Secondary | ICD-10-CM | POA: Insufficient documentation

## 2012-07-18 DIAGNOSIS — K0889 Other specified disorders of teeth and supporting structures: Secondary | ICD-10-CM

## 2012-07-18 DIAGNOSIS — Z8719 Personal history of other diseases of the digestive system: Secondary | ICD-10-CM | POA: Insufficient documentation

## 2012-07-18 DIAGNOSIS — K047 Periapical abscess without sinus: Secondary | ICD-10-CM | POA: Insufficient documentation

## 2012-07-18 DIAGNOSIS — F319 Bipolar disorder, unspecified: Secondary | ICD-10-CM | POA: Insufficient documentation

## 2012-07-18 DIAGNOSIS — Z8639 Personal history of other endocrine, nutritional and metabolic disease: Secondary | ICD-10-CM | POA: Insufficient documentation

## 2012-07-18 DIAGNOSIS — K089 Disorder of teeth and supporting structures, unspecified: Secondary | ICD-10-CM | POA: Insufficient documentation

## 2012-07-18 DIAGNOSIS — Z79899 Other long term (current) drug therapy: Secondary | ICD-10-CM | POA: Insufficient documentation

## 2012-07-18 MED ORDER — CLINDAMYCIN PHOSPHATE 900 MG/50ML IV SOLN
900.0000 mg | Freq: Once | INTRAVENOUS | Status: AC
  Administered 2012-07-18: 900 mg via INTRAVENOUS
  Filled 2012-07-18: qty 50

## 2012-07-18 MED ORDER — OXYCODONE-ACETAMINOPHEN 10-325 MG PO TABS: 1.0000 | ORAL_TABLET | ORAL | Status: DC | PRN

## 2012-07-18 MED ORDER — OXYCODONE-ACETAMINOPHEN 5-325 MG PO TABS
2.0000 | ORAL_TABLET | Freq: Once | ORAL | Status: AC
  Administered 2012-07-18: 2 via ORAL
  Filled 2012-07-18: qty 2

## 2012-07-18 MED ORDER — CLINDAMYCIN HCL 300 MG PO CAPS: 300.0000 mg | ORAL_CAPSULE | Freq: Four times a day (QID) | ORAL | Status: DC

## 2012-07-18 MED ORDER — FLUCONAZOLE 150 MG PO TABS
150.0000 mg | ORAL_TABLET | Freq: Once | ORAL | Status: AC
  Administered 2012-07-18: 150 mg via ORAL
  Filled 2012-07-18: qty 1

## 2012-07-18 NOTE — ED Notes (Signed)
C/o toothache x 2 days right top jaw

## 2012-07-18 NOTE — ED Notes (Signed)
Pt had this same symptoms in the past and  Had to be admitted with IV antibiotics. Pt has a picture of her previous admission. Pt  Mom has been calling dentist for 2 days to try to get antibiotics but dentist has not returned call. Pt was on Clindamycin  In the past.. Pt report her pain 8/10 left side face pain. Pt has tried hot packs ibuprofen and other home remedies and nothing has helped.

## 2012-07-18 NOTE — ED Provider Notes (Signed)
Medical screening examination/treatment/procedure(s) were performed by non-physician practitioner and as supervising physician I was immediately available for consultation/collaboration.    Celene Kras, MD 07/18/12 [phone]

## 2012-07-18 NOTE — ED Provider Notes (Signed)
History     CSN: [id number]  Arrival date & time 07/18/12  0128   First MD Initiated Contact with Patient 07/18/12 [phone]      Chief Complaint  Patient presents with  . Dental Pain   HPI  History provided by the patient and mother. Patient is a 27 year old female with past history of Hashimoto's thyroiditis with hyperparathyroidism following thyroidectomy who presents with complaints of upper dental pain. Pain first began 2 days ago and was waxing and waning. Yesterday symptoms became more persistent and increasing pain through the evening and this morning. Patient attempted to use her Loricet but did not have significant relief of pain. She has history of nearly complete dental extractions throughout related to calcium deficiencies. Patient has only her central and right lateral incisors remaining. She feels pain towards the left central incisor and into the maxilla and face. She denies having any significant swelling of the face. There's been no bleeding or drainage. No fever, chills or sweats. No other aggravating or alleviating factors.    Past Medical History  Diagnosis Date  . Fibromyalgia   . Depression   . Ileus     x3-4  . Hashimoto's thyroiditis     2005ish  . Hyperparathyroidism, unspecified   . Kidney stone     2013  . Bipolar disorder   . GERD (gastroesophageal reflux disease)   . Migraines     occasional aura  . Partial edentulism   . Torticollis     Past Surgical History  Procedure Laterality Date  . Appendicitis      2003  . Dilation and curettage of uterus      1999  . Parathyroidectomy      Family History  Problem Relation Age of Onset  . Hypertension Father   . Coronary artery disease Paternal Grandfather     smoker  . Cancer Maternal Grandfather     MM  . Diabetes Paternal Grandfather     Medication induced  . Hypertension Maternal Grandmother   . Migraines Mother   . Rheum arthritis Mother     undifferentiated connective tissue    History   Substance Use Topics  . Smoking status: Never Smoker   . Smokeless tobacco: Not on file  . Alcohol Use: No    OB History   Grav Para Term Preterm Abortions TAB SAB Ect Mult Living                  Review of Systems  Constitutional: Negative for fever, chills and diaphoresis.  All other systems reviewed and are negative.    Allergies  Amoxicillin; Demerol; Imitrex; Penicillins; Sulfa antibiotics; Depacon; Depakote; Dihydroergotamine; Metoclopramide; and Morphine and related  Home Medications   Current Outpatient Rx  Name  Route  Sig  Dispense  Refill  . cholecalciferol (VITAMIN D) 1000 UNITS tablet   Oral   Take 1 tablet (1,000 Units total) by mouth daily. For nutritional supplement.         . ferrous fumarate (HEMOCYTE - 106 MG FE) 325 (106 FE) MG TABS   Oral   Take 1 tablet by mouth.         . gabapentin (NEURONTIN) 300 MG capsule   Oral   Take 1 capsule (300 mg total) by mouth 3 (three) times daily. For anxiety and panic   90 capsule   0   . haloperidol (HALDOL) 5 MG tablet   Oral   Take 1 tablet (5 mg total) by mouth  3 (three) times daily. For mental clarity and psychosis.   90 tablet   0   . HYDROcodone-acetaminophen (LORCET) 10-650 MG per tablet   Oral   Take 1 tablet by mouth every 6 (six) hours as needed for pain. Pt is taking 10/325mg    But is not in list         . lamoTRIgine (LAMICTAL) 200 MG tablet   Oral   Take 1 tablet (200 mg total) by mouth at bedtime. For mood lability.   60 tablet   0   . naproxen (NAPROSYN) 500 MG tablet   Oral   Take 1 tablet (500 mg total) by mouth 2 (two) times daily with a meal. For pain.   60 tablet   0   . norethindrone-ethinyl estradiol (MICROGESTIN,JUNEL,LOESTRIN) 1-20 MG-MCG tablet   Oral   Take 1 tablet by mouth daily.         Marland Kitchen tiZANidine (ZANAFLEX) 4 MG tablet   Oral   Take 4 mg by mouth every 6 (six) hours as needed (neck pain).         . trihexyphenidyl (ARTANE) 5 MG tablet   Oral    Take 1 tablet (5 mg total) by mouth 2 (two) times daily with a meal.   60 tablet   0   . venlafaxine XR (EFFEXOR-XR) 150 MG 24 hr capsule   Oral   Take 2 capsules (300 mg total) by mouth daily. For anxiety and depression.   60 capsule   0     BP 122/86  Pulse 109  Temp(Src) 98.4 F (36.9 C) (Oral)  Resp 20  SpO2 97%  LMP 06/17/2012  Physical Exam  Nursing note and vitals reviewed. Constitutional: She is oriented to person, place, and time. She appears well-developed and well-nourished. No distress.  HENT:  Head: Normocephalic.  Patient with upper central incisors and right lateral incisors remaining all other teeth extracted. There appears to be some dental decay at the base of the left central incisor is increased tenderness to percussion and to the adjacent gums above. There is no significant swelling or fluctuance. No facial swelling.  Neck: Normal range of motion. Neck supple.  Cardiovascular: Normal rate and regular rhythm.   Pulmonary/Chest: Effort normal and breath sounds normal. No respiratory distress. She has no wheezes. She has no rales.  Musculoskeletal: Normal range of motion.  Lymphadenopathy:    She has no cervical adenopathy.  Neurological: She is alert and oriented to person, place, and time.  Skin: Skin is warm and dry. No rash noted.  Psychiatric: She has a normal mood and affect. Her behavior is normal.    ED Course  Procedures       1. Pain, dental   2. Periapical abscess       MDM  Patient seen and evaluated. She is sitting comfortably appears in no acute distress her significant discomfort.        Angus Seller, PA-C 07/18/12 [phone]

## 2012-07-18 NOTE — ED Notes (Signed)
Pt requesting Rx for flagyl due to her getting severe bowel infections such as c-diff from antibiotics in the past. Made oncoming nurse aware.

## 2012-07-18 NOTE — ED Notes (Signed)
Pt had picture from yesterday showing excessive swelling right side of face and jaw

## 2012-07-25 ENCOUNTER — Ambulatory Visit (INDEPENDENT_AMBULATORY_CARE_PROVIDER_SITE_OTHER): Payer: Medicare Other | Admitting: Physician Assistant

## 2012-07-25 DIAGNOSIS — F603 Borderline personality disorder: Secondary | ICD-10-CM

## 2012-07-25 DIAGNOSIS — F333 Major depressive disorder, recurrent, severe with psychotic symptoms: Secondary | ICD-10-CM

## 2012-07-26 ENCOUNTER — Ambulatory Visit (HOSPITAL_COMMUNITY): Payer: No Typology Code available for payment source | Admitting: Physician Assistant

## 2012-07-26 MED ORDER — GABAPENTIN 400 MG PO CAPS: 400.0000 mg | ORAL_CAPSULE | Freq: Three times a day (TID) | ORAL | Status: DC

## 2012-07-26 MED ORDER — LAMOTRIGINE 200 MG PO TABS: 200.0000 mg | ORAL_TABLET | Freq: Every day | ORAL | Status: DC

## 2012-07-26 MED ORDER — HALOPERIDOL 5 MG PO TABS: 5.0000 mg | ORAL_TABLET | Freq: Four times a day (QID) | ORAL | Status: DC

## 2012-07-26 MED ORDER — TRIHEXYPHENIDYL HCL 5 MG PO TABS: 5.0000 mg | ORAL_TABLET | Freq: Two times a day (BID) | ORAL | Status: DC

## 2012-07-27 MED ORDER — CHLORPROMAZINE HCL 50 MG PO TABS: 50.0000 mg | ORAL_TABLET | Freq: Every day | ORAL | Status: DC

## 2012-07-27 NOTE — Progress Notes (Addendum)
   [facility] Follow-up Outpatient Visit  Ashlee Day April 07, 1985  Date: 07/26/2012   Subjective: Ashlee Day presents today with her mother to followup on her mood disorder with psychosis. She was again admitted to the inpatient unit here at [facility] and had her medications adjusted. She was discharged on 06/19/2012. She reports that they decreased her Haldol dose and her auditory hallucinations have increased. She states that the voices are in her year and chest. She states that they are the voices of Facebook friends who were mean to her on Facebook, and they scream and curse at her and tell her derogatory things about herself. She endorses difficulty sleeping. Her mother reports that she shakes and rocks back and forth.  There were no vitals filed for this visit.  Mental Status Examination  Appearance: Casual Alert: Yes Attention: good  Cooperative: Yes Eye Contact: Good Speech: Clear and coherent Psychomotor Activity: Normal Memory/Concentration: Intact Oriented: person, place, time/date and situation Mood: Anxious and Depressed Affect: Congruent Thought Processes and Associations: Logical Fund of Knowledge: Fair Thought Content: Auditory hallucinations Insight: Poor Judgement: Poor  Diagnosis: Maj. depressive disorder, recurrent, severe with psychotic features; borderline personality disorder  Treatment Plan: We will increase her Haldol from 5 mg 3 times a day 25 mg 4 times a day, increase her Neurontin from 300 mg 3 times a day to 400 mg 3 times a day, and start her on Thorazine 50 mg at bedtime. She will return for followup in 2 months.  Ashlee Lattner, PA-C

## 2012-07-28 ENCOUNTER — Telehealth (HOSPITAL_COMMUNITY): Payer: Self-pay

## 2012-07-31 ENCOUNTER — Telehealth (HOSPITAL_COMMUNITY): Payer: Self-pay

## 2012-08-01 ENCOUNTER — Telehealth (HOSPITAL_COMMUNITY): Payer: Self-pay

## 2012-08-02 ENCOUNTER — Telehealth (HOSPITAL_COMMUNITY): Payer: Self-pay

## 2012-08-02 NOTE — Telephone Encounter (Signed)
Mother left [id number]:XWRUEA says one of her medicines was changed last week at appt.They were to call if it did not help, he said he could change dose by phone.

## 2012-08-02 NOTE — Telephone Encounter (Signed)
Contacted mother.Explained provide absence.Mother stated patient still hearing voices in her chest, hands and feet.Moves hands like hand puppets when she hears voices in hands.Moves feet generally when voices are in her feet. Still rocking. Mother states none of these actions changed or increased since increasing Haldol 5 mg from 3 times a day to 4 times a day. There has been no improvement. Informed mother that provider would be given this message tomorrow.

## 2012-08-03 ENCOUNTER — Other Ambulatory Visit (HOSPITAL_COMMUNITY): Payer: Self-pay | Admitting: Physician Assistant

## 2012-08-03 NOTE — Telephone Encounter (Signed)
Return to call to patient's mother. At last visit increased Haldol from 5 mg 3 times daily to 5 mg 4 times daily to target auditory hallucinations. Also initiated trazodone 50 mg at bedtime to enhance treatment of psychosis as well as improve sleep. Little if any improvement seen in auditory hallucinations. Slight improvement in sleep. Mother reports patient sleeps for 2 hours, then we'll be up for 4 or 5 hours prior to going back to sleep for another hour.  Will increase Thorazine to 100 mg at bedtime, and patient to report in 3-5 days on results.

## 2012-08-08 ENCOUNTER — Telehealth (HOSPITAL_COMMUNITY): Payer: Self-pay

## 2012-08-09 ENCOUNTER — Telehealth (HOSPITAL_COMMUNITY): Payer: Self-pay | Admitting: Physician Assistant

## 2012-08-09 NOTE — Telephone Encounter (Signed)
Return patient's call and spoke to patient's mother. Mother reports patient continues to struggle. He is emotionally labile and cried for 3 hours yesterday. Had not slept for 3 nights. No increase in extrapyramidal symptoms. If finally fell asleep this morning.  We'll increase the Haldol to 10 mg at 9 AM, 10 mg at 1 PM, and continue 5 mg at 5 PM and 10 PM. We'll increase Thorazine to 150 mg at bedtime. If EPS increases, will increase Artane to 5 mg 3 times daily.

## 2012-08-10 ENCOUNTER — Other Ambulatory Visit (HOSPITAL_COMMUNITY): Payer: Self-pay | Admitting: Physician Assistant

## 2012-08-10 ENCOUNTER — Telehealth (HOSPITAL_COMMUNITY): Payer: Self-pay | Admitting: *Deleted

## 2012-08-10 MED ORDER — HALOPERIDOL 5 MG PO TABS: ORAL_TABLET | ORAL | Status: DC

## 2012-08-10 MED ORDER — CHLORPROMAZINE HCL 100 MG PO TABS: 150.0000 mg | ORAL_TABLET | Freq: Every day | ORAL | Status: DC

## 2012-08-10 NOTE — Telephone Encounter (Signed)
Mother left VM: States provider changed medications for daughter yesterday,5/7.   Mother states Haldol was changed  to 10 mg at 9 AM, 10 mg at 1 PM, and to keep taking 5 mg at 5 PM and 10 PM and the Thorazine was changed  to 150 mg at bedtime.  Mother states she called pharmacy to get prescription refills/new prescriptions for medicines to cover the increased amount and they said they need new prescriptions from the provider. Otherwise mother will need to pay full retail cost as insurance will not pay without new prescriptions with new doses.

## 2012-08-11 ENCOUNTER — Telehealth (HOSPITAL_COMMUNITY): Payer: Self-pay

## 2012-08-13 ENCOUNTER — Emergency Department (HOSPITAL_BASED_OUTPATIENT_CLINIC_OR_DEPARTMENT_OTHER): Payer: No Typology Code available for payment source

## 2012-08-13 ENCOUNTER — Encounter (HOSPITAL_BASED_OUTPATIENT_CLINIC_OR_DEPARTMENT_OTHER): Payer: Self-pay

## 2012-08-13 ENCOUNTER — Emergency Department (HOSPITAL_BASED_OUTPATIENT_CLINIC_OR_DEPARTMENT_OTHER)
Admission: EM | Admit: 2012-08-13 | Discharge: 2012-08-13 | Disposition: A | Discharge status: Home / Self Care | Payer: No Typology Code available for payment source | Attending: Emergency Medicine | Admitting: Emergency Medicine

## 2012-08-13 DIAGNOSIS — Z862 Personal history of diseases of the blood and blood-forming organs and certain disorders involving the immune mechanism: Secondary | ICD-10-CM | POA: Insufficient documentation

## 2012-08-13 DIAGNOSIS — R61 Generalized hyperhidrosis: Secondary | ICD-10-CM | POA: Insufficient documentation

## 2012-08-13 DIAGNOSIS — Z8719 Personal history of other diseases of the digestive system: Secondary | ICD-10-CM | POA: Insufficient documentation

## 2012-08-13 DIAGNOSIS — R6883 Chills (without fever): Secondary | ICD-10-CM | POA: Insufficient documentation

## 2012-08-13 DIAGNOSIS — F319 Bipolar disorder, unspecified: Secondary | ICD-10-CM | POA: Insufficient documentation

## 2012-08-13 DIAGNOSIS — R109 Unspecified abdominal pain: Secondary | ICD-10-CM | POA: Insufficient documentation

## 2012-08-13 DIAGNOSIS — Z8739 Personal history of other diseases of the musculoskeletal system and connective tissue: Secondary | ICD-10-CM | POA: Insufficient documentation

## 2012-08-13 DIAGNOSIS — Z79899 Other long term (current) drug therapy: Secondary | ICD-10-CM | POA: Insufficient documentation

## 2012-08-13 DIAGNOSIS — Z3202 Encounter for pregnancy test, result negative: Secondary | ICD-10-CM | POA: Insufficient documentation

## 2012-08-13 DIAGNOSIS — Z8639 Personal history of other endocrine, nutritional and metabolic disease: Secondary | ICD-10-CM | POA: Insufficient documentation

## 2012-08-13 DIAGNOSIS — Z87442 Personal history of urinary calculi: Secondary | ICD-10-CM | POA: Insufficient documentation

## 2012-08-13 DIAGNOSIS — Z880 Allergy status to penicillin: Secondary | ICD-10-CM | POA: Insufficient documentation

## 2012-08-13 DIAGNOSIS — G43909 Migraine, unspecified, not intractable, without status migrainosus: Secondary | ICD-10-CM | POA: Insufficient documentation

## 2012-08-13 LAB — PREGNANCY, URINE: Preg Test, Ur: NEGATIVE

## 2012-08-13 LAB — URINALYSIS, ROUTINE W REFLEX MICROSCOPIC
Bilirubin Urine: NEGATIVE
Glucose, UA: NEGATIVE mg/dL
Hgb urine dipstick: NEGATIVE
Specific Gravity, Urine: 1.016 (ref 1.005–1.030)

## 2012-08-13 LAB — URINE MICROSCOPIC-ADD ON

## 2012-08-13 MED ORDER — FENTANYL CITRATE 0.05 MG/ML IJ SOLN
100.0000 ug | Freq: Once | INTRAMUSCULAR | Status: AC
  Administered 2012-08-13: 100 ug via INTRAVENOUS
  Filled 2012-08-13: qty 2

## 2012-08-13 MED ORDER — ONDANSETRON HCL 4 MG/2ML IJ SOLN
4.0000 mg | Freq: Once | INTRAMUSCULAR | Status: AC
  Administered 2012-08-13: 4 mg via INTRAVENOUS
  Filled 2012-08-13: qty 2

## 2012-08-13 MED ORDER — SODIUM CHLORIDE 0.9 % IV SOLN
INTRAVENOUS | Status: DC
Start: 2012-08-13 — End: 2012-08-13
  Administered 2012-08-13: 05:00:00 via INTRAVENOUS

## 2012-08-13 MED ORDER — ONDANSETRON 8 MG PO TBDP: 8.0000 mg | ORAL_TABLET | Freq: Three times a day (TID) | ORAL | Status: DC | PRN

## 2012-08-13 MED ORDER — KETOROLAC TROMETHAMINE 15 MG/ML IJ SOLN
15.0000 mg | Freq: Once | INTRAMUSCULAR | Status: AC
  Administered 2012-08-13: 15 mg via INTRAVENOUS
  Filled 2012-08-13: qty 1

## 2012-08-13 NOTE — ED Notes (Signed)
Returned from CT scan.

## 2012-08-13 NOTE — ED Notes (Signed)
Transported to CT 

## 2012-08-13 NOTE — ED Notes (Signed)
Patient reports that she developed right groin and right flank pain 4 days ago. Took AZO and loracet for pain without relief. Hx of kidney stones

## 2012-08-13 NOTE — ED Provider Notes (Signed)
History     CSN: [id number]  Arrival date & time 08/13/12  0348   First MD Initiated Contact with Patient 08/13/12 0406      Chief Complaint  Patient presents with  . Flank Pain    (Consider location/radiation/quality/duration/timing/severity/associated sxs/prior treatment) HPI This is a 27 year old female with a history of kidney stones. She is here with a four-day history of right groin pain and and difficulty voiding. She has been taking over-the-counter Azo without relief. The pain became more severe this morning, 8/10, and now radiates to her right flank. It is characterized as being like previous kidney stones. It was not relieved with a Lorcet tablet. She is not aware of having blood in her urine but her urine is stained by the Azo. She denies fever but has had sweats with chills. She states the pain is worse with movement.  Past Medical History  Diagnosis Date  . Fibromyalgia   . Depression   . Ileus     x3-4  . Hashimoto's thyroiditis     2005ish  . Hyperparathyroidism, unspecified   . Kidney stone     2013  . Bipolar disorder   . GERD (gastroesophageal reflux disease)   . Migraines     occasional aura  . Partial edentulism   . Torticollis     Past Surgical History  Procedure Laterality Date  . Appendicitis      2003  . Dilation and curettage of uterus      1999  . Parathyroidectomy      Family History  Problem Relation Age of Onset  . Hypertension Father   . Coronary artery disease Paternal Grandfather     smoker  . Cancer Maternal Grandfather     MM  . Diabetes Paternal Grandfather     Medication induced  . Hypertension Maternal Grandmother   . Migraines Mother   . Rheum arthritis Mother     undifferentiated connective tissue    History  Substance Use Topics  . Smoking status: Never Smoker   . Smokeless tobacco: Not on file  . Alcohol Use: No    OB History   Grav Para Term Preterm Abortions TAB SAB Ect Mult Living                   Review of Systems  All other systems reviewed and are negative.    Allergies  Amoxicillin; Demerol; Imitrex; Penicillins; Phenergan; Sulfa antibiotics; Depacon; Depakote; Dihydroergotamine; Metoclopramide; and Morphine and related  Home Medications   Current Outpatient Rx  Name  Route  Sig  Dispense  Refill  . chlorproMAZINE (THORAZINE) 100 MG tablet   Oral   Take 1.5 tablets (150 mg total) by mouth at bedtime.   45 tablet   0   . cholecalciferol (VITAMIN D) 1000 UNITS tablet   Oral   Take 1 tablet (1,000 Units total) by mouth daily. For nutritional supplement.         . ferrous fumarate (HEMOCYTE - 106 MG FE) 325 (106 FE) MG TABS   Oral   Take 1 tablet by mouth.         . gabapentin (NEURONTIN) 400 MG capsule   Oral   Take 1 capsule (400 mg total) by mouth 3 (three) times daily. For anxiety and panic   90 capsule   1   . haloperidol (HALDOL) 5 MG tablet      Take two tabs at 9am and 1pm, and one tab at 5pm and 10pm  180 tablet   0   . HYDROcodone-acetaminophen (LORCET) 10-650 MG per tablet   Oral   Take 1 tablet by mouth every 6 (six) hours as needed for pain. Pt is taking 10/325mg    But is not in list         . lamoTRIgine (LAMICTAL) 200 MG tablet   Oral   Take 1 tablet (200 mg total) by mouth at bedtime. For mood lability.   60 tablet   1   . naproxen (NAPROSYN) 500 MG tablet   Oral   Take 1 tablet (500 mg total) by mouth 2 (two) times daily with a meal. For pain.   60 tablet   0   . norethindrone-ethinyl estradiol (MICROGESTIN,JUNEL,LOESTRIN) 1-20 MG-MCG tablet   Oral   Take 1 tablet by mouth daily.         Marland Kitchen oxyCODONE-acetaminophen (PERCOCET) 10-325 MG per tablet   Oral   Take 1 tablet by mouth every 4 (four) hours as needed for pain.   10 tablet   0   . tiZANidine (ZANAFLEX) 4 MG tablet   Oral   Take 4 mg by mouth every 6 (six) hours as needed (neck pain).         . trihexyphenidyl (ARTANE) 5 MG tablet   Oral   Take 1 tablet  (5 mg total) by mouth 2 (two) times daily with a meal.   60 tablet   1   . venlafaxine XR (EFFEXOR-XR) 150 MG 24 hr capsule   Oral   Take 2 capsules (300 mg total) by mouth daily. For anxiety and depression.   60 capsule   0     BP 120/63  Pulse 86  Temp(Src) 98.1 F (36.7 C) (Oral)  Resp 18  SpO2 97%  LMP 08/09/2012  Physical Exam General: Well-developed, well-nourished female in no acute distress; appearance consistent with age of record HENT: normocephalic, atraumatic Eyes: pupils equal round and reactive to light; extraocular muscles intact Neck: supple Heart: regular rate and rhythm Lungs: clear to auscultation bilaterally Abdomen: soft; nondistended; right lower quadrant tenderness; bowel sounds present GU: Mild right CVA tenderness Extremities: No deformity; full range of motion; pulses normal Neurologic: Awake, alert and oriented; motor function intact in all extremities and symmetric; no facial droop Skin: Warm and dry Psychiatric: Flat affect    ED Course  Procedures (including critical care time)     MDM   Nursing notes and vitals signs, including pulse oximetry, reviewed.  Summary of this visit's results, reviewed by myself:  Labs:  Results for orders placed during the hospital encounter of 08/13/12 (from the past 24 hour(s))  URINALYSIS, ROUTINE W REFLEX MICROSCOPIC     Status: Abnormal   Collection Time    08/13/12  4:02 AM      Result Value Range   Color, Urine ORANGE (*) YELLOW   APPearance CLOUDY (*) CLEAR   Specific Gravity, Urine 1.016  1.005 - 1.030   pH 5.5  5.0 - 8.0   Glucose, UA NEGATIVE  NEGATIVE mg/dL   Hgb urine dipstick NEGATIVE  NEGATIVE   Bilirubin Urine NEGATIVE  NEGATIVE   Ketones, ur NEGATIVE  NEGATIVE mg/dL   Protein, ur NEGATIVE  NEGATIVE mg/dL   Urobilinogen, UA 1.0  0.0 - 1.0 mg/dL   Nitrite POSITIVE (*) NEGATIVE   Leukocytes, UA SMALL (*) NEGATIVE  PREGNANCY, URINE     Status: None   Collection Time    08/13/12   4:02 AM  Result Value Range   Preg Test, Ur NEGATIVE  NEGATIVE  URINE MICROSCOPIC-ADD ON     Status: Abnormal   Collection Time    08/13/12  4:02 AM      Result Value Range   Squamous Epithelial / LPF FEW (*) RARE   WBC, UA 3-6  <3 WBC/hpf   RBC / HPF 0-2  <3 RBC/hpf   Bacteria, UA MANY (*) RARE   Urine-Other MICROSCOPIC EXAM PERFORMED ON UNCONCENTRATED URINE      Imaging Studies: Ct Abdomen Pelvis Wo Contrast  08/13/2012  *RADIOLOGY REPORT*  Clinical Data: Right flank pain.  CT ABDOMEN AND PELVIS WITHOUT CONTRAST  Technique:  Multidetector CT imaging of the abdomen and pelvis was performed following the standard protocol without intravenous contrast.  Comparison: CT abdomen and pelvis 07/29/2004.  Findings: There is some basilar atelectasis.  No pleural or pericardial effusion.  Heart size normal.  Two nonobstructing stones are identified in the left kidney.  The larger is in the lower pole measuring 0.3 cm.  No right renal or ureteral stones are seen.  The right kidney has a normal uninfused appearance.  The spleen, adrenal glands, pancreas, gallbladder and liver all appear normal.  Uterus, adnexa and urinary bladder are unremarkable.  The appendix is not visualized and may have been removed.  No lymphadenopathy or fluid is seen.  There is no focal bony abnormality.  IMPRESSION: Two small nonobstructing stones are seen in the left kidney.  No right side urinary tract stones or other finding to explain right flank pain is identified.   Original Report Authenticated By: Holley Dexter, M.D.    5:37 AM Because of the patient's pain is not evident on CT scan. There is no ureteral stone seen nor hydroureter to suggest a recently passed stone. Her urinalysis is not diagnostic of urinary tract infection. Pyelonephritis would be expected to cause perinephric stranding on CT. We will avoid starting antibiotics at this time due to patient's past history of Clostridium difficile colitis following  antibiotic therapy. We will culture the urine and treat if culture is consistent with urinary tract infection. The patient is on a pain management contract and is not permitted any narcotics in addition to those she is currently prescribed. The patient was open and honest about this.         Hanley Seamen, MD 08/13/12 [phone]

## 2012-08-13 NOTE — ED Notes (Signed)
MD at bedside. Pt request one more dose of pain med prior to d/c home.

## 2012-08-14 LAB — URINE CULTURE

## 2012-08-19 ENCOUNTER — Emergency Department (HOSPITAL_BASED_OUTPATIENT_CLINIC_OR_DEPARTMENT_OTHER)
Admission: EM | Admit: 2012-08-19 | Discharge: 2012-08-19 | Disposition: A | Discharge status: Home / Self Care | Payer: No Typology Code available for payment source | Attending: Emergency Medicine | Admitting: Emergency Medicine

## 2012-08-19 ENCOUNTER — Encounter (HOSPITAL_BASED_OUTPATIENT_CLINIC_OR_DEPARTMENT_OTHER): Payer: Self-pay | Admitting: *Deleted

## 2012-08-19 DIAGNOSIS — G43909 Migraine, unspecified, not intractable, without status migrainosus: Secondary | ICD-10-CM | POA: Insufficient documentation

## 2012-08-19 DIAGNOSIS — Z79899 Other long term (current) drug therapy: Secondary | ICD-10-CM | POA: Insufficient documentation

## 2012-08-19 DIAGNOSIS — F319 Bipolar disorder, unspecified: Secondary | ICD-10-CM | POA: Insufficient documentation

## 2012-08-19 DIAGNOSIS — Z8719 Personal history of other diseases of the digestive system: Secondary | ICD-10-CM | POA: Insufficient documentation

## 2012-08-19 DIAGNOSIS — Z8739 Personal history of other diseases of the musculoskeletal system and connective tissue: Secondary | ICD-10-CM | POA: Insufficient documentation

## 2012-08-19 DIAGNOSIS — Z87442 Personal history of urinary calculi: Secondary | ICD-10-CM | POA: Insufficient documentation

## 2012-08-19 DIAGNOSIS — Z8639 Personal history of other endocrine, nutritional and metabolic disease: Secondary | ICD-10-CM | POA: Insufficient documentation

## 2012-08-19 DIAGNOSIS — G8929 Other chronic pain: Secondary | ICD-10-CM | POA: Insufficient documentation

## 2012-08-19 DIAGNOSIS — Z880 Allergy status to penicillin: Secondary | ICD-10-CM | POA: Insufficient documentation

## 2012-08-19 DIAGNOSIS — Z862 Personal history of diseases of the blood and blood-forming organs and certain disorders involving the immune mechanism: Secondary | ICD-10-CM | POA: Insufficient documentation

## 2012-08-19 DIAGNOSIS — K029 Dental caries, unspecified: Secondary | ICD-10-CM | POA: Insufficient documentation

## 2012-08-19 MED ORDER — CLINDAMYCIN HCL 300 MG PO CAPS: 300.0000 mg | ORAL_CAPSULE | Freq: Four times a day (QID) | ORAL | Status: DC

## 2012-08-19 MED ORDER — CLINDAMYCIN HCL 150 MG PO CAPS
300.0000 mg | ORAL_CAPSULE | Freq: Once | ORAL | Status: AC
  Administered 2012-08-19: 300 mg via ORAL
  Filled 2012-08-19: qty 2

## 2012-08-19 MED ORDER — AZITHROMYCIN 500 MG PO TABS: 500.0000 mg | ORAL_TABLET | Freq: Every day | ORAL | Status: DC

## 2012-08-19 MED ORDER — FENTANYL CITRATE 0.05 MG/ML IJ SOLN
50.0000 ug | Freq: Once | INTRAMUSCULAR | Status: AC
  Administered 2012-08-19: 50 ug via INTRAMUSCULAR
  Filled 2012-08-19: qty 2

## 2012-08-19 MED ORDER — ONDANSETRON 8 MG PO TBDP
8.0000 mg | ORAL_TABLET | Freq: Once | ORAL | Status: AC
  Administered 2012-08-19: 8 mg via ORAL
  Filled 2012-08-19: qty 1

## 2012-08-19 MED ORDER — AZITHROMYCIN 250 MG PO TABS
500.0000 mg | ORAL_TABLET | Freq: Once | ORAL | Status: AC
  Administered 2012-08-19: 500 mg via ORAL
  Filled 2012-08-19: qty 2

## 2012-08-19 MED ORDER — OXYCODONE HCL 5 MG PO TABS: 5.0000 mg | ORAL_TABLET | Freq: Four times a day (QID) | ORAL | Status: DC | PRN

## 2012-08-19 NOTE — ED Notes (Signed)
MD at bedside. 

## 2012-08-19 NOTE — ED Provider Notes (Signed)
History     CSN: [id number]  Arrival date & time 08/19/12  0020   First MD Initiated Contact with Patient 08/19/12 0144      Chief Complaint  Patient presents with  . Dental Pain    (Consider location/radiation/quality/duration/timing/severity/associated sxs/prior treatment) Patient is a 27 y.o. female presenting with tooth pain. The history is provided by the patient and a parent.  Dental PainPrimary symptoms do not include angioedema or cough. The symptoms began more than 1 month ago. The symptoms are worsening. The symptoms are chronic. The symptoms occur constantly.  Additional symptoms do not include: trismus, facial swelling and drooling. Medical issues do not include: smoking.    Past Medical History  Diagnosis Date  . Fibromyalgia   . Depression   . Ileus     x3-4  . Hashimoto's thyroiditis     2005ish  . Hyperparathyroidism, unspecified   . Kidney stone     2013  . Bipolar disorder   . GERD (gastroesophageal reflux disease)   . Migraines     occasional aura  . Partial edentulism   . Torticollis     Past Surgical History  Procedure Laterality Date  . Appendicitis      2003  . Dilation and curettage of uterus      1999  . Parathyroidectomy      Family History  Problem Relation Age of Onset  . Hypertension Father   . Coronary artery disease Paternal Grandfather     smoker  . Cancer Maternal Grandfather     MM  . Diabetes Paternal Grandfather     Medication induced  . Hypertension Maternal Grandmother   . Migraines Mother   . Rheum arthritis Mother     undifferentiated connective tissue    History  Substance Use Topics  . Smoking status: Never Smoker   . Smokeless tobacco: Not on file  . Alcohol Use: No    OB History   Grav Para Term Preterm Abortions TAB SAB Ect Mult Living                  Review of Systems  HENT: Negative for facial swelling and drooling.   Respiratory: Negative for cough.   All other systems reviewed and are  negative.    Allergies  Amoxicillin; Demerol; Imitrex; Penicillins; Phenergan; Sulfa antibiotics; Depacon; Depakote; Dihydroergotamine; Metoclopramide; and Morphine and related  Home Medications   Current Outpatient Rx  Name  Route  Sig  Dispense  Refill  . azithromycin (ZITHROMAX) 500 MG tablet   Oral   Take 1 tablet (500 mg total) by mouth daily. Take first 2 tablets together, then 1 every day until finished.   2 tablet   0   . cholecalciferol (VITAMIN D) 1000 UNITS tablet   Oral   Take 1 tablet (1,000 Units total) by mouth daily. For nutritional supplement.         . clindamycin (CLEOCIN) 300 MG capsule   Oral   Take 1 capsule (300 mg total) by mouth 4 (four) times daily.   30 capsule   0   . ferrous fumarate (HEMOCYTE - 106 MG FE) 325 (106 FE) MG TABS   Oral   Take 1 tablet by mouth.         . gabapentin (NEURONTIN) 400 MG capsule   Oral   Take 1 capsule (400 mg total) by mouth 3 (three) times daily. For anxiety and panic   90 capsule   1   .  haloperidol (HALDOL) 5 MG tablet      Take two tabs at 9am and 1pm, and one tab at 5pm and 10pm   180 tablet   0   . HYDROcodone-acetaminophen (LORCET) 10-650 MG per tablet   Oral   Take 1 tablet by mouth every 6 (six) hours as needed for pain. Pt is taking 10/325mg    But is not in list         . lamoTRIgine (LAMICTAL) 200 MG tablet   Oral   Take 1 tablet (200 mg total) by mouth at bedtime. For mood lability.   60 tablet   1   . naproxen (NAPROSYN) 500 MG tablet   Oral   Take 1 tablet (500 mg total) by mouth 2 (two) times daily with a meal. For pain.   60 tablet   0   . norethindrone-ethinyl estradiol (MICROGESTIN,JUNEL,LOESTRIN) 1-20 MG-MCG tablet   Oral   Take 1 tablet by mouth daily.         . ondansetron (ZOFRAN ODT) 8 MG disintegrating tablet   Oral   Take 1 tablet (8 mg total) by mouth every 8 (eight) hours as needed.   10 tablet   0   . oxyCODONE (ROXICODONE) 5 MG immediate release tablet    Oral   Take 1 tablet (5 mg total) by mouth every 6 (six) hours as needed for pain.   11 tablet   0   . oxyCODONE-acetaminophen (PERCOCET) 10-325 MG per tablet   Oral   Take 1 tablet by mouth every 4 (four) hours as needed for pain.   10 tablet   0   . tiZANidine (ZANAFLEX) 4 MG tablet   Oral   Take 4 mg by mouth every 6 (six) hours as needed (neck pain).         . trihexyphenidyl (ARTANE) 5 MG tablet   Oral   Take 1 tablet (5 mg total) by mouth 2 (two) times daily with a meal.   60 tablet   1   . venlafaxine XR (EFFEXOR-XR) 150 MG 24 hr capsule   Oral   Take 2 capsules (300 mg total) by mouth daily. For anxiety and depression.   60 capsule   0     BP 100/68  Pulse 98  Temp(Src) 98.1 F (36.7 C) (Oral)  Resp 16  Ht 5\' 3"  (1.6 m)  Wt 190 lb (86.183 kg)  BMI 33.67 kg/m2  SpO2 100%  LMP 08/09/2012  Physical Exam  Constitutional: She is oriented to person, place, and time. She appears well-developed and well-nourished. No distress.  HENT:  Head: Normocephalic and atraumatic. No trismus in the jaw.  Mouth/Throat: Mucous membranes are not pale. Dental caries present. No dental abscesses. No oropharyngeal exudate, posterior oropharyngeal edema, posterior oropharyngeal erythema or tonsillar abscesses.    Eyes: Conjunctivae are normal. Pupils are equal, round, and reactive to light.  Neck: Normal range of motion. Neck supple.  Cardiovascular: Normal rate and regular rhythm.   Pulmonary/Chest: Effort normal and breath sounds normal. She has no wheezes. She has no rales.  Abdominal: Soft. Bowel sounds are normal. There is no tenderness. There is no rebound and no guarding.  Musculoskeletal: Normal range of motion.  Neurological: She is alert and oriented to person, place, and time.  Skin: Skin is warm and dry.  Psychiatric: She has a normal mood and affect.    ED Course  Procedures (including critical care time)  Labs Reviewed - No data to display No  results  found.   1. Dental caries       MDM  Pain since d/c of antibiotics last week.    Will cover for typical and atypical pathogens.  Clindamycin and azithromycin tripak.  Pain medication ordered for patient.  No indication for imaging or IV antibiotics at this time.  Will need to see her oral surgeon this week for extraction of her 3 remaining teeth.  Patient and mother verbalize understanding and agree to follow up.  USe condoms for one full pill cycle following antibiotics to prevent the risk of pregnancy.  Continue yogurt or probiotics while on antibiotics.          Jasmine Awe, MD 08/19/12 [phone]

## 2012-08-19 NOTE — ED Notes (Signed)
Pt c/o toothache x 1 day, seen last week at [facility] for same

## 2012-08-21 ENCOUNTER — Telehealth (HOSPITAL_COMMUNITY): Payer: Self-pay | Admitting: *Deleted

## 2012-08-21 ENCOUNTER — Other Ambulatory Visit (HOSPITAL_COMMUNITY): Payer: Self-pay | Admitting: Physician Assistant

## 2012-08-21 MED ORDER — CHLORPROMAZINE HCL 200 MG PO TABS: 200.0000 mg | ORAL_TABLET | Freq: Every day | ORAL | Status: DC

## 2012-08-21 NOTE — Telephone Encounter (Signed)
Mother has been calling about my Haldol for the past week and cannot get a hold of you. Please call us back, it would be appreciated.

## 2012-08-21 NOTE — Telephone Encounter (Signed)
Was told to call provider after last medication change if any other changes required. Requests call back from provider regarding daughter's medication

## 2012-08-21 NOTE — Telephone Encounter (Signed)
Return call to patient and spoke with patient's mother initially. Mother reports the increased dose of Haldol seems to help some, but patient continues to report she is hearing voices constantly, and that they are derogatory in nature and, and her to curse at her mother. Denies any EPS. She states that she is still not sleeping, and mother reports that she will sleep 15 minutes at a time. Patient got on the phone and spoke to provider. Patient reports that she sleeps 2-3 hours per night, and is sleeping in the bed with her mother do to her anxiety. Recommended that patient enroll 4 classes at the [facility] [place]. Patient reports she has not done that because she has no teeth. Provider reiterated that patient needs to follow this provider's recommendations, or may need to refer to another provider. Prescription sent to pharmacy for increased dose of Thorazine 200 mg at bedtime. Patient has appointment on 6/25.

## 2012-08-24 ENCOUNTER — Telehealth (HOSPITAL_COMMUNITY): Payer: Self-pay | Admitting: *Deleted

## 2012-08-24 ENCOUNTER — Other Ambulatory Visit (HOSPITAL_COMMUNITY): Payer: Self-pay | Admitting: Physician Assistant

## 2012-08-24 MED ORDER — TRIHEXYPHENIDYL HCL 5 MG PO TABS: 5.0000 mg | ORAL_TABLET | Freq: Three times a day (TID) | ORAL | Status: DC

## 2012-08-24 NOTE — Telephone Encounter (Signed)
Returned mother's call. Patient has developed facial tics, and has been chewing on her tongue.  Will increase Artane to 3 times daily. New prescription sent to pharmacy. Mother asked about increasing the Haldol. Discussed that Haldol is a major cause of the extrapyramidal symptoms that she is exhibiting. Mother asked about starting Zyprexa. Expressed concern regarding weight gain associated with Zyprexa. Mother understands and agrees.

## 2012-08-24 NOTE — Telephone Encounter (Signed)
VM from mother: States medication changes were made for her daughter the first of the week. She needs to talk about results with provider and would like call back today

## 2012-08-31 ENCOUNTER — Telehealth (HOSPITAL_COMMUNITY): Payer: Self-pay | Admitting: *Deleted

## 2012-08-31 NOTE — Telephone Encounter (Signed)
VM: Patient states she needs her Haldol increased and that provider said he would increase it by a phone call. States they have been calling "several days", and they were waiting on call back.

## 2012-09-01 ENCOUNTER — Other Ambulatory Visit (HOSPITAL_COMMUNITY): Payer: Self-pay | Admitting: Physician Assistant

## 2012-09-01 ENCOUNTER — Encounter (HOSPITAL_BASED_OUTPATIENT_CLINIC_OR_DEPARTMENT_OTHER): Payer: Self-pay | Admitting: *Deleted

## 2012-09-01 ENCOUNTER — Emergency Department (HOSPITAL_BASED_OUTPATIENT_CLINIC_OR_DEPARTMENT_OTHER)
Admission: EM | Admit: 2012-09-01 | Discharge: 2012-09-01 | Disposition: A | Discharge status: Home / Self Care | Payer: No Typology Code available for payment source | Attending: Emergency Medicine | Admitting: Emergency Medicine

## 2012-09-01 DIAGNOSIS — Z8679 Personal history of other diseases of the circulatory system: Secondary | ICD-10-CM | POA: Insufficient documentation

## 2012-09-01 DIAGNOSIS — F329 Major depressive disorder, single episode, unspecified: Secondary | ICD-10-CM | POA: Insufficient documentation

## 2012-09-01 DIAGNOSIS — K089 Disorder of teeth and supporting structures, unspecified: Secondary | ICD-10-CM | POA: Insufficient documentation

## 2012-09-01 DIAGNOSIS — Z79899 Other long term (current) drug therapy: Secondary | ICD-10-CM | POA: Insufficient documentation

## 2012-09-01 DIAGNOSIS — F319 Bipolar disorder, unspecified: Secondary | ICD-10-CM | POA: Insufficient documentation

## 2012-09-01 DIAGNOSIS — Z862 Personal history of diseases of the blood and blood-forming organs and certain disorders involving the immune mechanism: Secondary | ICD-10-CM | POA: Insufficient documentation

## 2012-09-01 DIAGNOSIS — R110 Nausea: Secondary | ICD-10-CM | POA: Insufficient documentation

## 2012-09-01 DIAGNOSIS — Z8739 Personal history of other diseases of the musculoskeletal system and connective tissue: Secondary | ICD-10-CM | POA: Insufficient documentation

## 2012-09-01 DIAGNOSIS — F3289 Other specified depressive episodes: Secondary | ICD-10-CM | POA: Insufficient documentation

## 2012-09-01 DIAGNOSIS — Z87442 Personal history of urinary calculi: Secondary | ICD-10-CM | POA: Insufficient documentation

## 2012-09-01 DIAGNOSIS — Z880 Allergy status to penicillin: Secondary | ICD-10-CM | POA: Insufficient documentation

## 2012-09-01 DIAGNOSIS — Z8719 Personal history of other diseases of the digestive system: Secondary | ICD-10-CM | POA: Insufficient documentation

## 2012-09-01 DIAGNOSIS — IMO0001 Reserved for inherently not codable concepts without codable children: Secondary | ICD-10-CM | POA: Insufficient documentation

## 2012-09-01 DIAGNOSIS — B372 Candidiasis of skin and nail: Secondary | ICD-10-CM | POA: Insufficient documentation

## 2012-09-01 DIAGNOSIS — Z8639 Personal history of other endocrine, nutritional and metabolic disease: Secondary | ICD-10-CM | POA: Insufficient documentation

## 2012-09-01 MED ORDER — TERBINAFINE HCL 1 % EX CREA: TOPICAL_CREAM | CUTANEOUS | Status: DC

## 2012-09-01 MED ORDER — ONDANSETRON 4 MG PO TBDP
4.0000 mg | ORAL_TABLET | Freq: Once | ORAL | Status: AC
  Administered 2012-09-01: 4 mg via ORAL
  Filled 2012-09-01: qty 1

## 2012-09-01 MED ORDER — FLUCONAZOLE 50 MG PO TABS
150.0000 mg | ORAL_TABLET | Freq: Once | ORAL | Status: AC
  Administered 2012-09-01: 150 mg via ORAL
  Filled 2012-09-01: qty 1

## 2012-09-01 MED ORDER — ONDANSETRON 8 MG PO TBDP: 8.0000 mg | ORAL_TABLET | Freq: Three times a day (TID) | ORAL | Status: DC | PRN

## 2012-09-01 MED ORDER — HALOPERIDOL 10 MG PO TABS: 10.0000 mg | ORAL_TABLET | Freq: Four times a day (QID) | ORAL | Status: DC

## 2012-09-01 NOTE — ED Notes (Signed)
Pt is reporting a yeast infection under her breasts and in her groin.

## 2012-09-01 NOTE — ED Notes (Signed)
Pt reporting 8/10 pain.  There are no nonverbal signs of pain.

## 2012-09-01 NOTE — ED Provider Notes (Signed)
History     CSN: [id number]  Arrival date & time 09/01/12  0227   First MD Initiated Contact with Patient 09/01/12 [phone]      Chief Complaint  Patient presents with  . Rash    (Consider location/radiation/quality/duration/timing/severity/associated sxs/prior treatment) HPI This is a 27 year old female with a long-standing history of psychiatric illness including drug-seeking behavior. She was recently placed on clindamycin and a narcotic for a toothache. She is here now with about a three-day history of an intertriginous rash below her breasts and below her abdominal pannus. She states there is a severe pain and nausea associated with it. Her mother states that she has not had a bowel movement in about 2 weeks despite taking lactulose. She has been using nystatin and lidocaine to treat her rash. She has also been using Monistat vaginally for a yeast infection. She has had similar symptoms in the past after taking penicillin.  Past Medical History  Diagnosis Date  . Fibromyalgia   . Depression   . Ileus     x3-4  . Hashimoto's thyroiditis     2005ish  . Hyperparathyroidism, unspecified   . Kidney stone     2013  . Bipolar disorder   . GERD (gastroesophageal reflux disease)   . Migraines     occasional aura  . Partial edentulism   . Torticollis     Past Surgical History  Procedure Laterality Date  . Appendicitis      2003  . Dilation and curettage of uterus      1999  . Parathyroidectomy      Family History  Problem Relation Age of Onset  . Hypertension Father   . Coronary artery disease Paternal Grandfather     smoker  . Cancer Maternal Grandfather     MM  . Diabetes Paternal Grandfather     Medication induced  . Hypertension Maternal Grandmother   . Migraines Mother   . Rheum arthritis Mother     undifferentiated connective tissue    History  Substance Use Topics  . Smoking status: Never Smoker   . Smokeless tobacco: Not on file  . Alcohol Use: No     OB History   Grav Para Term Preterm Abortions TAB SAB Ect Mult Living                  Review of Systems  All other systems reviewed and are negative.    Allergies  Amoxicillin; Demerol; Imitrex; Penicillins; Phenergan; Sulfa antibiotics; Depacon; Depakote; Dihydroergotamine; Metoclopramide; and Morphine and related  Home Medications   Current Outpatient Rx  Name  Route  Sig  Dispense  Refill  . azithromycin (ZITHROMAX) 500 MG tablet   Oral   Take 1 tablet (500 mg total) by mouth daily. Take first 2 tablets together, then 1 every day until finished.   2 tablet   0   . chlorproMAZINE (THORAZINE) 200 MG tablet   Oral   Take 1 tablet (200 mg total) by mouth at bedtime.   30 tablet   0   . cholecalciferol (VITAMIN D) 1000 UNITS tablet   Oral   Take 1 tablet (1,000 Units total) by mouth daily. For nutritional supplement.         . clindamycin (CLEOCIN) 300 MG capsule   Oral   Take 1 capsule (300 mg total) by mouth 4 (four) times daily.   30 capsule   0   . ferrous fumarate (HEMOCYTE - 106 MG FE) 325 (106 FE)  MG TABS   Oral   Take 1 tablet by mouth.         . gabapentin (NEURONTIN) 400 MG capsule   Oral   Take 1 capsule (400 mg total) by mouth 3 (three) times daily. For anxiety and panic   90 capsule   1   . haloperidol (HALDOL) 5 MG tablet      Take two tabs at 9am and 1pm, and one tab at 5pm and 10pm   180 tablet   0   . HYDROcodone-acetaminophen (LORCET) 10-650 MG per tablet   Oral   Take 1 tablet by mouth every 6 (six) hours as needed for pain. Pt is taking 10/325mg    But is not in list         . lamoTRIgine (LAMICTAL) 200 MG tablet   Oral   Take 1 tablet (200 mg total) by mouth at bedtime. For mood lability.   60 tablet   1   . naproxen (NAPROSYN) 500 MG tablet   Oral   Take 1 tablet (500 mg total) by mouth 2 (two) times daily with a meal. For pain.   60 tablet   0   . norethindrone-ethinyl estradiol (MICROGESTIN,JUNEL,LOESTRIN) 1-20  MG-MCG tablet   Oral   Take 1 tablet by mouth daily.         . ondansetron (ZOFRAN ODT) 8 MG disintegrating tablet   Oral   Take 1 tablet (8 mg total) by mouth every 8 (eight) hours as needed.   10 tablet   0   . oxyCODONE (ROXICODONE) 5 MG immediate release tablet   Oral   Take 1 tablet (5 mg total) by mouth every 6 (six) hours as needed for pain.   11 tablet   0   . oxyCODONE-acetaminophen (PERCOCET) 10-325 MG per tablet   Oral   Take 1 tablet by mouth every 4 (four) hours as needed for pain.   10 tablet   0   . tiZANidine (ZANAFLEX) 4 MG tablet   Oral   Take 4 mg by mouth every 6 (six) hours as needed (neck pain).         . trihexyphenidyl (ARTANE) 5 MG tablet   Oral   Take 1 tablet (5 mg total) by mouth 3 (three) times daily with meals.   90 tablet   0   . venlafaxine XR (EFFEXOR-XR) 150 MG 24 hr capsule   Oral   Take 2 capsules (300 mg total) by mouth daily. For anxiety and depression.   60 capsule   0     BP 117/73  Pulse 87  Temp(Src) 97.7 F (36.5 C) (Oral)  Resp 18  Wt 190 lb (86.183 kg)  BMI 33.67 kg/m2  SpO2 97%  LMP 08/09/2012  Physical Exam General: Well-developed, well-nourished female in no acute distress; appearance consistent with age of record HENT: normocephalic, atraumatic Eyes: pupils equal round and reactive to light; extraocular muscles intact Neck: supple Heart: regular rate and rhythm Lungs: clear to auscultation bilaterally Abdomen: soft; nondistended; nontender; no masses or hepatosplenomegaly; bowel sounds present Extremities: No deformity; full range of motion; pulses normal Neurologic: Awake, alert; motor function intact in all extremities and symmetric; no facial droop Skin: Warm and dry; intertriginous, erythematous rash beneath her breasts and her abdominal pannus with satellite lesions consistent with candidiasis Psychiatric: Flat affect    ED Course  Procedures (including critical care time)    MDM  Patient  requesting narcotic pain medication for her branch. She  was advised that this is not indicated nor would it be wise given her current constipated state. She was advised to consider using MiraLax over-the-counter if she is not getting relief with lactulose.        Hanley Seamen, MD 09/01/12 [phone]

## 2012-09-05 ENCOUNTER — Telehealth (HOSPITAL_COMMUNITY): Payer: Self-pay | Admitting: *Deleted

## 2012-09-05 NOTE — Telephone Encounter (Signed)
2 VM from patient: States she has been calling for week and half. Would like provider to increase her Haldol dose. Has appt 6/25.

## 2012-09-07 ENCOUNTER — Telehealth (HOSPITAL_COMMUNITY): Payer: Self-pay | Admitting: *Deleted

## 2012-09-07 NOTE — Telephone Encounter (Signed)
Advised mother and Ashlee Day that new prescription for Haldol sent to pharmacy 09/01/12: 10 mg four times a day. Mother states they had not recv'd message last Friday from provider with this information.

## 2012-09-15 ENCOUNTER — Telehealth (HOSPITAL_COMMUNITY): Payer: Self-pay

## 2012-09-19 MED ORDER — CHLORPROMAZINE HCL 50 MG PO TABS: 50.0000 mg | ORAL_TABLET | Freq: Two times a day (BID) | ORAL | Status: DC

## 2012-09-19 MED ORDER — CHLORPROMAZINE HCL 200 MG PO TABS: 200.0000 mg | ORAL_TABLET | Freq: Every day | ORAL | Status: DC

## 2012-09-19 NOTE — Telephone Encounter (Signed)
Spoke with mother and patient.  Reports she is still hearing voices and wants haldol increased.  States she is still not sleeping well.  Denies EPS.  Already on 10mg  qid.  Will increase Thorazine to 50 mg bid and 200 mg at bedtime.

## 2012-09-21 ENCOUNTER — Telehealth (HOSPITAL_COMMUNITY): Payer: Self-pay

## 2012-09-26 ENCOUNTER — Other Ambulatory Visit (HOSPITAL_COMMUNITY): Payer: Self-pay | Admitting: Physician Assistant

## 2012-09-27 ENCOUNTER — Ambulatory Visit (INDEPENDENT_AMBULATORY_CARE_PROVIDER_SITE_OTHER): Payer: No Typology Code available for payment source | Admitting: Physician Assistant

## 2012-09-27 VITALS — BP 115/72 | HR 85 | Ht 63.0 in | Wt 190.0 lb

## 2012-09-27 DIAGNOSIS — F323 Major depressive disorder, single episode, severe with psychotic features: Secondary | ICD-10-CM

## 2012-09-27 MED ORDER — OLANZAPINE 10 MG PO TABS: 10.0000 mg | ORAL_TABLET | Freq: Every day | ORAL | Status: DC

## 2012-09-29 ENCOUNTER — Encounter (HOSPITAL_COMMUNITY): Payer: Self-pay | Admitting: Physician Assistant

## 2012-09-29 NOTE — Progress Notes (Signed)
[facility] [id number] Progress Note  Ashlee Day [id number] 27 y.o.  09/27/2012 3:38 PM  Chief Complaint: Depression with psychotic features  History of Present Illness: Ashlee Day presents today accompanied by her mother to followup on her treatment for her depression and auditory hallucinations. We have had several conversations by telephone with medication changes since her last appointment on 07/26/2012. We increased her Haldol dose to 10 mg 4 times daily, and the auditory hallucinations did not improve. We increased her Thorazine to 50 mg twice daily +200 mg at bedtime. Today she reports that she continues to have auditory hallucinations and she states that they are "still in her hands." She also reports that the voices are vomiting in her food, and she is having visual hallucinations of vomiting and her food. She is scheduled to have her thyroid removed on July 7, and she endorses a significant amount of anxiety about this upcoming surgery, and is asking for medication for the anxiety. She also reports that she is still not sleeping. She is adamant about having her medications changed to resolve all the above-described problems. She reports that in the past she has taken Seroquel, which helped her to sleep, that made her feel groggy the next day, and did not help with her anxiety. She reports that she has taken Zyprexa in the past which helped with anxiety. She is currently denying any suicidal or homicidal ideation.  Suicidal Ideation: No Plan Formed: No Patient has means to carry out plan: No  Homicidal Ideation: No Plan Formed: No Patient has means to carry out plan: No  Review of Systems: Psychiatric: Agitation: No Hallucination: Yes Depressed Mood: Yes Insomnia: Yes Hypersomnia: No Altered Concentration: No Feels Worthless: No Grandiose Ideas: No Belief In Special Powers: No New/Increased Substance Abuse: No Compulsions: No  Neurologic: Headache: No Seizure:  No Paresthesias: No  Past Medical History: Fibromyalgia, ileus, Hashimoto's thyroiditis, hyperparathyroidism, GERD, migraines   Outpatient Encounter Prescriptions as of 09/27/2012  Medication Sig Dispense Refill  . azithromycin (ZITHROMAX) 500 MG tablet Take 1 tablet (500 mg total) by mouth daily. Take first 2 tablets together, then 1 every day until finished.  2 tablet  0  . chlorproMAZINE (THORAZINE) 200 MG tablet Take 1 tablet (200 mg total) by mouth at bedtime.  30 tablet  0  . chlorproMAZINE (THORAZINE) 50 MG tablet Take 1 tablet (50 mg total) by mouth 2 (two) times daily.  60 tablet  0  . cholecalciferol (VITAMIN D) 1000 UNITS tablet Take 1 tablet (1,000 Units total) by mouth daily. For nutritional supplement.      . clindamycin (CLEOCIN) 300 MG capsule Take 1 capsule (300 mg total) by mouth 4 (four) times daily.  30 capsule  0  . ferrous fumarate (HEMOCYTE - 106 MG FE) 325 (106 FE) MG TABS Take 1 tablet by mouth.      . gabapentin (NEURONTIN) 400 MG capsule Take 1 capsule (400 mg total) by mouth 3 (three) times daily. For anxiety and panic  90 capsule  1  . haloperidol (HALDOL) 10 MG tablet Take 1 tablet (10 mg total) by mouth 4 (four) times daily.  120 tablet  0  . HYDROcodone-acetaminophen (LORCET) 10-650 MG per tablet Take 1 tablet by mouth every 6 (six) hours as needed for pain. Pt is taking 10/325mg    But is not in list      . lamoTRIgine (LAMICTAL) 200 MG tablet Take 1 tablet (200 mg total) by mouth at bedtime. For mood lability.  60 tablet  1  . naproxen (NAPROSYN) 500 MG tablet Take 1 tablet (500 mg total) by mouth 2 (two) times daily with a meal. For pain.  60 tablet  0  . norethindrone-ethinyl estradiol (MICROGESTIN,JUNEL,LOESTRIN) 1-20 MG-MCG tablet Take 1 tablet by mouth daily.      Marland Kitchen OLANZapine (ZYPREXA) 10 MG tablet Take 1 tablet (10 mg total) by mouth at bedtime.  30 tablet  0  . ondansetron (ZOFRAN ODT) 8 MG disintegrating tablet Take 1 tablet (8 mg total) by mouth every 8  (eight) hours as needed.  10 tablet  0  . ondansetron (ZOFRAN ODT) 8 MG disintegrating tablet Take 1 tablet (8 mg total) by mouth every 8 (eight) hours as needed.  10 tablet  0  . oxyCODONE (ROXICODONE) 5 MG immediate release tablet Take 1 tablet (5 mg total) by mouth every 6 (six) hours as needed for pain.  11 tablet  0  . oxyCODONE-acetaminophen (PERCOCET) 10-325 MG per tablet Take 1 tablet by mouth every 4 (four) hours as needed for pain.  10 tablet  0  . terbinafine (LAMISIL AT) 1 % cream Apply to rash twice daily.  42 g  0  . tiZANidine (ZANAFLEX) 4 MG tablet Take 4 mg by mouth every 6 (six) hours as needed (neck pain).      . trihexyphenidyl (ARTANE) 5 MG tablet TAKE 1 TABLET BY MOUTH THREE TIMES DAILY WITH MEALS  90 tablet  0  . venlafaxine XR (EFFEXOR-XR) 150 MG 24 hr capsule Take 2 capsules (300 mg total) by mouth daily. For anxiety and depression.  60 capsule  0   No facility-administered encounter medications on file as of 09/27/2012.    Past Psychiatric History/Hospitalization(s): Anxiety: Yes Bipolar Disorder: No Depression: Yes Mania: No Psychosis: Yes Schizophrenia: No Personality Disorder: Yes Hospitalization for psychiatric illness: Yes History of Electroconvulsive Shock Therapy: No Prior Suicide Attempts: No  Physical Exam: Constitutional:  BP 115/72  Pulse 85  Ht 5\' 3"  (1.6 m)  Wt 190 lb (86.183 kg)  BMI 33.67 kg/m2  General Appearance: alert, oriented, no acute distress, well nourished and casually dressed  Musculoskeletal: Strength & Muscle Tone: within normal limits Gait & Station: normal Patient leans: N/A  Psychiatric: Speech (describe rate, volume, coherence, spontaneity, and abnormalities if any): Clear and coherent any regular rate and rhythm and normal volume  Thought Process (describe rate, content, abstract reasoning, and computation): Within normal limits  Associations: Relevant  Thoughts: delusions, hallucinations and Illogical  Mental  Status: Orientation: oriented to person, place, time/date and situation Mood & Affect: flat affect and depressed mood Attention Span & Concentration: Intact  Medical Decision Making (Choose Three): Review of Psycho-Social Stressors (1), Established Problem, Worsening (2), Review of Medication Regimen & Side Effects (2) and Review of New Medication or Change in Dosage (2)  Assessment: Axis I: Maj. depressive disorder, recurrent, severe with psychotic features  Axis II: Dependent personality disorder  Axis III: Fibromyalgia, ileus, Hashimoto's thyroiditis, hyperparathyroidism, GERD, migraines   Axis IV: Moderate to severe  Axis V: 30   Plan: It was discussed with Nehemiah Settle, and strongly recommended that she accept long-term hospitalization had a State facility where they can monitor her behavior and symptoms over an extended period of time, and adjust medications appropriately on a daily basis if necessary. Amariya adamantly refuses to accept this option. It was reiterated that trying to prescribe medications to resolve her multiple, complicated symptoms on an outpatient basis is extremely difficult at best, especially in light  of the availability for appointments with this provider.   We will add to her current medication regimen Zyprexa 10 mg at bedtime, and after 6 or 8 days, if improvement is not recognized, she is given permission to increase it to 20 mg at bedtime. She will return for a followup visit at my next available appointment in 2 months.  Salem Mastrogiovanni, PA-C 09/29/2012

## 2012-10-07 ENCOUNTER — Other Ambulatory Visit (HOSPITAL_COMMUNITY): Payer: Self-pay | Admitting: Physician Assistant

## 2012-10-13 ENCOUNTER — Other Ambulatory Visit (HOSPITAL_COMMUNITY): Payer: Self-pay | Admitting: Physician Assistant

## 2012-10-18 ENCOUNTER — Telehealth (HOSPITAL_COMMUNITY): Payer: Self-pay | Admitting: *Deleted

## 2012-10-18 ENCOUNTER — Telehealth (HOSPITAL_COMMUNITY): Payer: Self-pay | Admitting: Physician Assistant

## 2012-10-18 MED ORDER — TRIHEXYPHENIDYL HCL 5 MG PO TABS: 5.0000 mg | ORAL_TABLET | Freq: Three times a day (TID) | ORAL | Status: DC

## 2012-10-18 NOTE — Telephone Encounter (Signed)
Returned mother's call concerning increased involuntary muscle movements. Will increase Artane to 4 times daily. If improvement is not seen in the next 24 hours, other to call back.

## 2012-10-18 NOTE — Telephone Encounter (Signed)
Mother left VM: Esabella is having some jerking movements, possibly related to Haldol.Mother wants to know if Artane should be increased.  Lattie had thyroid surgery on 10/09/12. Entire thyroid removed by Dr. Lady Gary. 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 Kieli has not been informed of malignancy yet.This will happen next week.   Use Mother's cell # for any calls - [phone]. Mother wants any communication about Maciel to go thru her.

## 2012-10-19 ENCOUNTER — Other Ambulatory Visit (HOSPITAL_COMMUNITY): Payer: Self-pay | Admitting: Physician Assistant

## 2012-10-31 ENCOUNTER — Telehealth (HOSPITAL_COMMUNITY): Payer: Self-pay | Admitting: *Deleted

## 2012-10-31 NOTE — Telephone Encounter (Signed)
Mother concerned about Haldol, states Ashlee Day is having tremors and pain in jaw. Client did not take her Haldol for 2 days due to tremors but mother found out and explained to her daughter that she must take it. Mother would like to know if dosage could be reduced by 5mg  or even lower if Ashlee Day is agreeable.

## 2012-10-31 NOTE — Telephone Encounter (Signed)
Per Jorje Guild, Called mother [facility]) to advise her to give Haldol 5mg  QID instead of 10mg  QID. Mother verbalized understanding and thanked this Clinical research associate for calling back to address her concerns.

## 2012-11-04 ENCOUNTER — Emergency Department (HOSPITAL_BASED_OUTPATIENT_CLINIC_OR_DEPARTMENT_OTHER)
Admission: EM | Admit: 2012-11-04 | Discharge: 2012-11-04 | Disposition: A | Discharge status: Home / Self Care | Payer: Commercial Managed Care - PPO | Attending: Emergency Medicine | Admitting: Emergency Medicine

## 2012-11-04 ENCOUNTER — Encounter (HOSPITAL_BASED_OUTPATIENT_CLINIC_OR_DEPARTMENT_OTHER): Payer: Self-pay | Admitting: *Deleted

## 2012-11-04 DIAGNOSIS — Z8719 Personal history of other diseases of the digestive system: Secondary | ICD-10-CM | POA: Insufficient documentation

## 2012-11-04 DIAGNOSIS — S0993XA Unspecified injury of face, initial encounter: Secondary | ICD-10-CM | POA: Insufficient documentation

## 2012-11-04 DIAGNOSIS — Z862 Personal history of diseases of the blood and blood-forming organs and certain disorders involving the immune mechanism: Secondary | ICD-10-CM | POA: Insufficient documentation

## 2012-11-04 DIAGNOSIS — Y9389 Activity, other specified: Secondary | ICD-10-CM | POA: Insufficient documentation

## 2012-11-04 DIAGNOSIS — G43909 Migraine, unspecified, not intractable, without status migrainosus: Secondary | ICD-10-CM | POA: Insufficient documentation

## 2012-11-04 DIAGNOSIS — Z8669 Personal history of other diseases of the nervous system and sense organs: Secondary | ICD-10-CM | POA: Insufficient documentation

## 2012-11-04 DIAGNOSIS — Z8739 Personal history of other diseases of the musculoskeletal system and connective tissue: Secondary | ICD-10-CM | POA: Insufficient documentation

## 2012-11-04 DIAGNOSIS — Z8639 Personal history of other endocrine, nutritional and metabolic disease: Secondary | ICD-10-CM | POA: Insufficient documentation

## 2012-11-04 DIAGNOSIS — Y929 Unspecified place or not applicable: Secondary | ICD-10-CM | POA: Insufficient documentation

## 2012-11-04 DIAGNOSIS — Z880 Allergy status to penicillin: Secondary | ICD-10-CM | POA: Insufficient documentation

## 2012-11-04 DIAGNOSIS — F319 Bipolar disorder, unspecified: Secondary | ICD-10-CM | POA: Insufficient documentation

## 2012-11-04 DIAGNOSIS — IMO0001 Reserved for inherently not codable concepts without codable children: Secondary | ICD-10-CM | POA: Insufficient documentation

## 2012-11-04 DIAGNOSIS — M549 Dorsalgia, unspecified: Secondary | ICD-10-CM

## 2012-11-04 DIAGNOSIS — Z79899 Other long term (current) drug therapy: Secondary | ICD-10-CM | POA: Insufficient documentation

## 2012-11-04 DIAGNOSIS — Z87442 Personal history of urinary calculi: Secondary | ICD-10-CM | POA: Insufficient documentation

## 2012-11-04 DIAGNOSIS — X500XXA Overexertion from strenuous movement or load, initial encounter: Secondary | ICD-10-CM | POA: Insufficient documentation

## 2012-11-04 DIAGNOSIS — IMO0002 Reserved for concepts with insufficient information to code with codable children: Secondary | ICD-10-CM | POA: Insufficient documentation

## 2012-11-04 MED ORDER — OXYCODONE-ACETAMINOPHEN 5-325 MG PO TABS
2.0000 | ORAL_TABLET | Freq: Once | ORAL | Status: AC
  Administered 2012-11-04: 2 via ORAL
  Filled 2012-11-04 (×2): qty 2

## 2012-11-04 MED ORDER — IBUPROFEN 800 MG PO TABS
800.0000 mg | ORAL_TABLET | Freq: Once | ORAL | Status: AC
  Administered 2012-11-04: 800 mg via ORAL
  Filled 2012-11-04: qty 1

## 2012-11-04 MED ORDER — ONDANSETRON 8 MG PO TBDP
8.0000 mg | ORAL_TABLET | Freq: Once | ORAL | Status: AC
  Administered 2012-11-04: 8 mg via ORAL
  Filled 2012-11-04: qty 1

## 2012-11-04 NOTE — ED Provider Notes (Signed)
CSN: [id number]     Arrival date & time 11/04/12  0957 History     First MD Initiated Contact with Patient 11/04/12 1004     Chief Complaint  Patient presents with  . Back Pain    Patient is a 27 y.o. female presenting with back pain. The history is provided by the patient.  Back Pain Location:  Lumbar spine Quality:  Aching Radiates to:  Does not radiate Pain severity:  Moderate Pain is:  Same all the time Onset quality:  Gradual Duration:  1 day Timing:  Constant Progression:  Worsening Chronicity:  New Context: lifting heavy objects   Relieved by:  Bed rest Worsened by:  Bending Ineffective treatments:  Narcotics Associated symptoms: no abdominal pain, no bowel incontinence, no dysuria, no fever, no leg pain, no numbness and no weakness   Risk factors: no recent surgery   pt reports she was moving a recliner chair yesterday and "heard a pop" and started having back pain.  It is mostly focused in low back.  She reports she does have pain in her neck, but most of pain is in low back No falls No dysuria No fever No abd pain No h/o back surgery   Past Medical History  Diagnosis Date  . Fibromyalgia   . Depression   . Ileus     x3-4  . Hashimoto's thyroiditis     2005ish  . Hyperparathyroidism, unspecified   . Kidney stone     2013  . Bipolar disorder   . GERD (gastroesophageal reflux disease)   . Migraines     occasional aura  . Partial edentulism   . Torticollis    Past Surgical History  Procedure Laterality Date  . Appendicitis      2003  . Dilation and curettage of uterus      1999  . Parathyroidectomy     Family History  Problem Relation Age of Onset  . Hypertension Father   . Coronary artery disease Paternal Grandfather     smoker  . Cancer Maternal Grandfather     MM  . Diabetes Paternal Grandfather     Medication induced  . Hypertension Maternal Grandmother   . Migraines Mother   . Rheum arthritis Mother     undifferentiated connective  tissue   History  Substance Use Topics  . Smoking status: Never Smoker   . Smokeless tobacco: Not on file  . Alcohol Use: No   OB History   Grav Para Term Preterm Abortions TAB SAB Ect Mult Living                 Review of Systems  Constitutional: Negative for fever.  HENT: Positive for neck pain.   Gastrointestinal: Negative for vomiting, abdominal pain and bowel incontinence.  Genitourinary: Negative for dysuria, flank pain and difficulty urinating.  Musculoskeletal: Positive for back pain.  Neurological: Negative for weakness and numbness.    Allergies  Amoxicillin; Demerol; Imitrex; Penicillins; Phenergan; Sulfa antibiotics; Depacon; Depakote; Dihydroergotamine; Metoclopramide; and Morphine and related  Home Medications   Current Outpatient Rx  Name  Route  Sig  Dispense  Refill  . chlorproMAZINE (THORAZINE) 200 MG tablet      TAKE 1 TABLET BY MOUTH AT BEDTIME   30 tablet   1   . chlorproMAZINE (THORAZINE) 50 MG tablet      TAKE 1 TABLET BY MOUTH TWICE DAILY   60 tablet   1   . cholecalciferol (VITAMIN D) 1000 UNITS tablet  Oral   Take 1 tablet (1,000 Units total) by mouth daily. For nutritional supplement.         . ferrous fumarate (HEMOCYTE - 106 MG FE) 325 (106 FE) MG TABS   Oral   Take 1 tablet by mouth.         . gabapentin (NEURONTIN) 400 MG capsule      TAKE ONE CAPSULE BY MOUTH THREE TIMES DAILY FOR ANXIETY AND PANIC   90 capsule   0   . haloperidol (HALDOL) 10 MG tablet      TAKE 1 TABLET BY MOUTH FOUR TIMES DAILY   120 tablet   0   . HYDROcodone-acetaminophen (LORCET) 10-650 MG per tablet   Oral   Take 1 tablet by mouth every 6 (six) hours as needed for pain. Pt is taking 10/325mg    But is not in list         . lamoTRIgine (LAMICTAL) 200 MG tablet   Oral   Take 1 tablet (200 mg total) by mouth at bedtime. For mood lability.   60 tablet   1   . naproxen (NAPROSYN) 500 MG tablet   Oral   Take 1 tablet (500 mg total) by mouth  2 (two) times daily with a meal. For pain.   60 tablet   0   . norethindrone-ethinyl estradiol (MICROGESTIN,JUNEL,LOESTRIN) 1-20 MG-MCG tablet   Oral   Take 1 tablet by mouth daily.         Marland Kitchen OLANZapine (ZYPREXA) 10 MG tablet   Oral   Take 1 tablet (10 mg total) by mouth at bedtime.   30 tablet   0   . tiZANidine (ZANAFLEX) 4 MG tablet   Oral   Take 4 mg by mouth every 6 (six) hours as needed (neck pain).         . trihexyphenidyl (ARTANE) 5 MG tablet   Oral   Take 1 tablet (5 mg total) by mouth 4 (four) times daily -  with meals and at bedtime.   120 tablet   0   . venlafaxine XR (EFFEXOR-XR) 150 MG 24 hr capsule      TAKE 2 CAPSULES BY MOUTH DAILY   60 capsule   5   . azithromycin (ZITHROMAX) 500 MG tablet   Oral   Take 1 tablet (500 mg total) by mouth daily. Take first 2 tablets together, then 1 every day until finished.   2 tablet   0   . clindamycin (CLEOCIN) 300 MG capsule   Oral   Take 1 capsule (300 mg total) by mouth 4 (four) times daily.   30 capsule   0   . ondansetron (ZOFRAN ODT) 8 MG disintegrating tablet   Oral   Take 1 tablet (8 mg total) by mouth every 8 (eight) hours as needed.   10 tablet   0   . ondansetron (ZOFRAN ODT) 8 MG disintegrating tablet   Oral   Take 1 tablet (8 mg total) by mouth every 8 (eight) hours as needed.   10 tablet   0   . oxyCODONE (ROXICODONE) 5 MG immediate release tablet   Oral   Take 1 tablet (5 mg total) by mouth every 6 (six) hours as needed for pain.   11 tablet   0   . oxyCODONE-acetaminophen (PERCOCET) 10-325 MG per tablet   Oral   Take 1 tablet by mouth every 4 (four) hours as needed for pain.   10 tablet   0   .  terbinafine (LAMISIL AT) 1 % cream      Apply to rash twice daily.   42 g   0    BP 128/77  Pulse 96  Temp(Src) 97.8 F (36.6 C) (Oral)  Resp 16  Ht 5\' 3"  (1.6 m)  Wt 200 lb (90.719 kg)  BMI 35.44 kg/m2  SpO2 100% Physical Exam CONSTITUTIONAL: Well developed/well  nourished HEAD: Normocephalic/atraumatic EYES: EOMI/PERRL ENMT: Mucous membranes moist NECK: supple no meningeal signs SPINE:lumbar spine tender.  No bruising/crepitance/stepoffs noted to spine CV: S1/S2 noted, no murmurs/rubs/gallops noted LUNGS: Lungs are clear to auscultation bilaterally, no apparent distress ABDOMEN: soft, nontender, no rebound or guarding GU:no cva tenderness NEURO: Awake/alert, equal distal motor: hip flexion/knee flexion/extension, ankle dorsi/plantar flexion, great toe extension intact bilaterally, no clonus bilaterally, plantar reflex appropriate, no apparent sensory deficit in any dermatome.  Equal patellar/achilles reflex noted.  Pt is able to ambulate unassisted in my presence.  (she insists on leaving in wheelchair but she can ambulate) EXTREMITIES: pulses normal, full ROM SKIN: warm, color normal PSYCH: flat affect   ED Course   Procedures  1. Back pain     MDM  Nursing notes including past medical history and social history reviewed and considered in documentation Previous records reviewed and considered Narcotic database reviewed  Pt reports pain since moving furniture.  She has no urinary symptoms to suggest UTI/kidney stone She is well appearing,nontoxic and ambulatory She has no neuro deficits Do not feel emergent imaging (had CT earlier this year with no bony abnormality mentioned) Per narcotic database pt should have pain meds at home She is requesting a "shot" but I advised ibuprofen, heating pad, and f/u with PCP in two days if no improvement We discussed strict return precautions.  Her father was present (she consented to his presence) and he is aware of return precautions  Joya Gaskins, MD 11/04/12 1047

## 2012-11-04 NOTE — ED Notes (Signed)
Patient was moving furniture yesterday and heard her back pop.  Pain is getting worse.  Patient tool Lorcet last night w/o relief.  Pain is experiencing pain from her neck down to her lower back.

## 2012-11-07 ENCOUNTER — Other Ambulatory Visit (HOSPITAL_COMMUNITY): Payer: Self-pay | Admitting: Physician Assistant

## 2012-11-13 ENCOUNTER — Other Ambulatory Visit (HOSPITAL_COMMUNITY): Payer: Self-pay | Admitting: Physician Assistant

## 2012-11-13 ENCOUNTER — Telehealth (HOSPITAL_COMMUNITY): Payer: Self-pay | Admitting: *Deleted

## 2012-11-13 NOTE — Telephone Encounter (Signed)
Mother left VM: States Ashlee Day's Haldol dose was changed a few weeks ago to Haldol 5 mg 4 x day. Now having worse shaking and the voices are worse, screaming at her. Mother restarted Ashlee Day on Haldol 10 mg 4 x day. Very anxious. Voices non-stop. Has not slept in 4 nights. States Ashlee Day says she wants to go in her ear and get the voices out. Mother wants to know if medication should be adjusted again.

## 2012-11-24 ENCOUNTER — Encounter (HOSPITAL_BASED_OUTPATIENT_CLINIC_OR_DEPARTMENT_OTHER): Payer: Self-pay

## 2012-11-24 ENCOUNTER — Emergency Department (HOSPITAL_BASED_OUTPATIENT_CLINIC_OR_DEPARTMENT_OTHER)
Admission: EM | Admit: 2012-11-24 | Discharge: 2012-11-24 | Disposition: A | Discharge status: Home / Self Care | Payer: Commercial Managed Care - PPO | Attending: Emergency Medicine | Admitting: Emergency Medicine

## 2012-11-24 ENCOUNTER — Emergency Department (HOSPITAL_BASED_OUTPATIENT_CLINIC_OR_DEPARTMENT_OTHER): Payer: Commercial Managed Care - PPO

## 2012-11-24 DIAGNOSIS — R109 Unspecified abdominal pain: Secondary | ICD-10-CM | POA: Insufficient documentation

## 2012-11-24 DIAGNOSIS — Z8639 Personal history of other endocrine, nutritional and metabolic disease: Secondary | ICD-10-CM | POA: Insufficient documentation

## 2012-11-24 DIAGNOSIS — Z79899 Other long term (current) drug therapy: Secondary | ICD-10-CM | POA: Insufficient documentation

## 2012-11-24 DIAGNOSIS — Z3202 Encounter for pregnancy test, result negative: Secondary | ICD-10-CM | POA: Insufficient documentation

## 2012-11-24 DIAGNOSIS — F319 Bipolar disorder, unspecified: Secondary | ICD-10-CM | POA: Insufficient documentation

## 2012-11-24 DIAGNOSIS — N946 Dysmenorrhea, unspecified: Secondary | ICD-10-CM | POA: Insufficient documentation

## 2012-11-24 DIAGNOSIS — Z8719 Personal history of other diseases of the digestive system: Secondary | ICD-10-CM | POA: Insufficient documentation

## 2012-11-24 DIAGNOSIS — E063 Autoimmune thyroiditis: Secondary | ICD-10-CM | POA: Insufficient documentation

## 2012-11-24 DIAGNOSIS — Z9889 Other specified postprocedural states: Secondary | ICD-10-CM | POA: Insufficient documentation

## 2012-11-24 DIAGNOSIS — IMO0001 Reserved for inherently not codable concepts without codable children: Secondary | ICD-10-CM | POA: Insufficient documentation

## 2012-11-24 DIAGNOSIS — E669 Obesity, unspecified: Secondary | ICD-10-CM | POA: Insufficient documentation

## 2012-11-24 DIAGNOSIS — Z880 Allergy status to penicillin: Secondary | ICD-10-CM | POA: Insufficient documentation

## 2012-11-24 DIAGNOSIS — Z87442 Personal history of urinary calculi: Secondary | ICD-10-CM | POA: Insufficient documentation

## 2012-11-24 DIAGNOSIS — F329 Major depressive disorder, single episode, unspecified: Secondary | ICD-10-CM | POA: Insufficient documentation

## 2012-11-24 DIAGNOSIS — F3289 Other specified depressive episodes: Secondary | ICD-10-CM | POA: Insufficient documentation

## 2012-11-24 DIAGNOSIS — K219 Gastro-esophageal reflux disease without esophagitis: Secondary | ICD-10-CM | POA: Insufficient documentation

## 2012-11-24 DIAGNOSIS — Z8739 Personal history of other diseases of the musculoskeletal system and connective tissue: Secondary | ICD-10-CM | POA: Insufficient documentation

## 2012-11-24 DIAGNOSIS — Z862 Personal history of diseases of the blood and blood-forming organs and certain disorders involving the immune mechanism: Secondary | ICD-10-CM | POA: Insufficient documentation

## 2012-11-24 DIAGNOSIS — R110 Nausea: Secondary | ICD-10-CM | POA: Insufficient documentation

## 2012-11-24 DIAGNOSIS — G43909 Migraine, unspecified, not intractable, without status migrainosus: Secondary | ICD-10-CM | POA: Insufficient documentation

## 2012-11-24 LAB — URINALYSIS, ROUTINE W REFLEX MICROSCOPIC
Bilirubin Urine: NEGATIVE
Glucose, UA: NEGATIVE mg/dL
Ketones, ur: NEGATIVE mg/dL
Protein, ur: NEGATIVE mg/dL
pH: 6 (ref 5.0–8.0)

## 2012-11-24 LAB — CBC WITH DIFFERENTIAL/PLATELET
Basophils Absolute: 0 10*3/uL (ref 0.0–0.1)
Basophils Relative: 1 % (ref 0–1)
Eosinophils Absolute: 0.3 10*3/uL (ref 0.0–0.7)
Eosinophils Relative: 5 % (ref 0–5)
HCT: 33 % — ABNORMAL LOW (ref 36.0–46.0)
Hemoglobin: 10.6 g/dL — ABNORMAL LOW (ref 12.0–15.0)
MCH: 27.2 pg (ref 26.0–34.0)
MCHC: 32.1 g/dL (ref 30.0–36.0)
Monocytes Absolute: 0.6 10*3/uL (ref 0.1–1.0)
Monocytes Relative: 10 % (ref 3–12)
RDW: 13.1 % (ref 11.5–15.5)

## 2012-11-24 LAB — URINE MICROSCOPIC-ADD ON

## 2012-11-24 LAB — WET PREP, GENITAL

## 2012-11-24 LAB — PREGNANCY, URINE: Preg Test, Ur: NEGATIVE

## 2012-11-24 MED ORDER — ONDANSETRON 8 MG PO TBDP
8.0000 mg | ORAL_TABLET | Freq: Once | ORAL | Status: AC
  Administered 2012-11-24: 8 mg via ORAL
  Filled 2012-11-24: qty 1

## 2012-11-24 MED ORDER — KETOROLAC TROMETHAMINE 60 MG/2ML IM SOLN
60.0000 mg | Freq: Once | INTRAMUSCULAR | Status: AC
  Administered 2012-11-24: 60 mg via INTRAMUSCULAR
  Filled 2012-11-24: qty 2

## 2012-11-24 MED ORDER — HYDROCODONE-ACETAMINOPHEN 5-325 MG PO TABS
2.0000 | ORAL_TABLET | Freq: Once | ORAL | Status: AC
  Administered 2012-11-24: 2 via ORAL
  Filled 2012-11-24: qty 2

## 2012-11-24 NOTE — ED Provider Notes (Signed)
CSN: [id number]     Arrival date & time 11/24/12  4098 History     First MD Initiated Contact with Patient 11/24/12 [phone]     Chief Complaint  Patient presents with  . Vaginal Bleeding  . Abdominal Pain   (Consider location/radiation/quality/duration/timing/severity/associated sxs/prior Treatment) HPI Complains of low crampy abdominal pain, suprapubic nonradiating onset 3 days ago accompanied by vaginal bleeding  patient missed 2 doses of her birth control pills, August 16 11/19/2012 while an inpatient in psychiatric facility. She has since restarted her birth control pills. She is treated with ibuprofen and Lorcet, without relief of pain. Other symptoms include nausea. No vomiting. Last tablet yesterday, normal. No fever. No other associated symptoms. Past Medical History  Diagnosis Date  . Fibromyalgia   . Depression   . Ileus     x3-4  . Hashimoto's thyroiditis     2005ish  . Hyperparathyroidism, unspecified   . Kidney stone     2013  . Bipolar disorder   . GERD (gastroesophageal reflux disease)   . Migraines     occasional aura  . Partial edentulism   . Torticollis    Past Surgical History  Procedure Laterality Date  . Appendicitis      2003  . Dilation and curettage of uterus      1999  . Parathyroidectomy     Family History  Problem Relation Age of Onset  . Hypertension Father   . Coronary artery disease Paternal Grandfather     smoker  . Cancer Maternal Grandfather     MM  . Diabetes Paternal Grandfather     Medication induced  . Hypertension Maternal Grandmother   . Migraines Mother   . Rheum arthritis Mother     undifferentiated connective tissue   History  Substance Use Topics  . Smoking status: Never Smoker   . Smokeless tobacco: Not on file  . Alcohol Use: No   OB History   Grav Para Term Preterm Abortions TAB SAB Ect Mult Living                 Review of Systems  Constitutional: Negative.   HENT: Negative.   Respiratory: Negative.    Cardiovascular: Negative.   Gastrointestinal: Positive for nausea and abdominal pain.  Genitourinary: Positive for vaginal bleeding.  Musculoskeletal: Negative.   Skin: Negative.   Neurological: Negative.   Psychiatric/Behavioral: Negative.   All other systems reviewed and are negative.    Allergies  Amoxicillin; Demerol; Imitrex; Penicillins; Phenergan; Sulfa antibiotics; Depacon; Depakote; Dihydroergotamine; Metoclopramide; and Morphine and related  Home Medications   Current Outpatient Rx  Name  Route  Sig  Dispense  Refill  . busPIRone (BUSPAR) 15 MG tablet   Oral   Take 15 mg by mouth 3 (three) times daily.         . calcitRIOL (ROCALTROL) 0.5 MCG capsule   Oral   Take 0.5 mcg by mouth daily.         Marland Kitchen docusate sodium (COLACE) 100 MG capsule   Oral   Take 100 mg by mouth 2 (two) times daily.         Marland Kitchen ibuprofen (ADVIL,MOTRIN) 800 MG tablet   Oral   Take 800 mg by mouth every 8 (eight) hours as needed for pain.         Marland Kitchen levothyroxine (SYNTHROID, LEVOTHROID) 175 MCG tablet   Oral   Take 175 mcg by mouth daily before breakfast.         .  zonisamide (ZONEGRAN) 100 MG capsule   Oral   Take 100 mg by mouth daily.         . chlorproMAZINE (THORAZINE) 200 MG tablet      TAKE 1 TABLET BY MOUTH AT BEDTIME   30 tablet   1   . chlorproMAZINE (THORAZINE) 50 MG tablet      TAKE 1 TABLET BY MOUTH TWICE DAILY   60 tablet   1   . cholecalciferol (VITAMIN D) 1000 UNITS tablet   Oral   Take 1 tablet (1,000 Units total) by mouth daily. For nutritional supplement.         . gabapentin (NEURONTIN) 400 MG capsule      TAKE ONE CAPSULE BY MOUTH THREE TIMES DAILY FOR ANXIETY AND PANIC   90 capsule   0   . lamoTRIgine (LAMICTAL) 200 MG tablet   Oral   Take 1 tablet (200 mg total) by mouth at bedtime. For mood lability.   60 tablet   1   . norethindrone-ethinyl estradiol (MICROGESTIN,JUNEL,LOESTRIN) 1-20 MG-MCG tablet   Oral   Take 1 tablet by mouth  daily.         Marland Kitchen tiZANidine (ZANAFLEX) 4 MG tablet   Oral   Take 4 mg by mouth every 6 (six) hours as needed (neck pain).         Marland Kitchen venlafaxine XR (EFFEXOR-XR) 150 MG 24 hr capsule      TAKE 2 CAPSULES BY MOUTH DAILY   60 capsule   5    BP 115/67  Pulse 96  Temp(Src) 97.6 F (36.4 C) (Oral)  Resp 16  Ht 5\' 3"  (1.6 m)  Wt 200 lb (90.719 kg)  BMI 35.44 kg/m2  SpO2 100%  LMP 11/24/2012 Physical Exam  Nursing note and vitals reviewed. Constitutional: She is oriented to person, place, and time. She appears well-developed and well-nourished. No distress.  HENT:  Head: Normocephalic and atraumatic.  Eyes: Conjunctivae are normal. Pupils are equal, round, and reactive to light.  Neck: Neck supple. No tracheal deviation present. No thyromegaly present.  Cardiovascular: Normal rate and regular rhythm.   No murmur heard. Pulmonary/Chest: Effort normal and breath sounds normal.  Abdominal: Soft. Bowel sounds are normal. She exhibits no distension. There is no tenderness.  Obese  Genitourinary:  Normal external genitalia. Small amount of dark blood in the vaginal vault. No active bleeding. Cervical os closed. No cervical motion tenderness. Right adnexal tenderness. No adnexal mass.  Musculoskeletal: Normal range of motion. She exhibits no edema and no tenderness.  Neurological: She is alert and oriented to person, place, and time. Coordination normal.  Skin: Skin is warm and dry. No rash noted.  Psychiatric: She has a normal mood and affect.    ED Course  11 AM pain not improved after treatment with norco she requesting Toradol injection which I have ordered  Procedures (including critical care time) 1150 am feels improved after toradol Labs Reviewed  GC/CHLAMYDIA PROBE AMP  WET PREP, GENITAL  PREGNANCY, URINE  URINALYSIS, ROUTINE W REFLEX MICROSCOPIC  CBC WITH DIFFERENTIAL   No results found. No diagnosis found. Results for orders placed during the hospital encounter  of 11/24/12  WET PREP, GENITAL      Result Value Range   Yeast Wet Prep HPF POC NONE SEEN  NONE SEEN   Trich, Wet Prep NONE SEEN  NONE SEEN   Clue Cells Wet Prep HPF POC NONE SEEN  NONE SEEN   WBC, Wet Prep HPF POC  MODERATE (*) NONE SEEN  PREGNANCY, URINE      Result Value Range   Preg Test, Ur NEGATIVE  NEGATIVE  URINALYSIS, ROUTINE W REFLEX MICROSCOPIC      Result Value Range   Color, Urine YELLOW  YELLOW   APPearance CLEAR  CLEAR   Specific Gravity, Urine 1.041 (*) 1.005 - 1.030   pH 6.0  5.0 - 8.0   Glucose, UA NEGATIVE  NEGATIVE mg/dL   Hgb urine dipstick SMALL (*) NEGATIVE   Bilirubin Urine NEGATIVE  NEGATIVE   Ketones, ur NEGATIVE  NEGATIVE mg/dL   Protein, ur NEGATIVE  NEGATIVE mg/dL   Urobilinogen, UA 0.2  0.0 - 1.0 mg/dL   Nitrite NEGATIVE  NEGATIVE   Leukocytes, UA NEGATIVE  NEGATIVE  CBC WITH DIFFERENTIAL      Result Value Range   WBC 6.2  4.0 - 10.5 K/uL   RBC 3.89  3.87 - 5.11 MIL/uL   Hemoglobin 10.6 (*) 12.0 - 15.0 g/dL   HCT 08.6 (*) 57.8 - 46.9 %   MCV 84.8  78.0 - 100.0 fL   MCH 27.2  26.0 - 34.0 pg   MCHC 32.1  30.0 - 36.0 g/dL   RDW 62.9  52.8 - 41.3 %   Platelets 232  150 - 400 K/uL   Neutrophils Relative % 53  43 - 77 %   Neutro Abs 3.3  1.7 - 7.7 K/uL   Lymphocytes Relative 32  12 - 46 %   Lymphs Abs 2.0  0.7 - 4.0 K/uL   Monocytes Relative 10  3 - 12 %   Monocytes Absolute 0.6  0.1 - 1.0 K/uL   Eosinophils Relative 5  0 - 5 %   Eosinophils Absolute 0.3  0.0 - 0.7 K/uL   Basophils Relative 1  0 - 1 %   Basophils Absolute 0.0  0.0 - 0.1 K/uL  URINE MICROSCOPIC-ADD ON      Result Value Range   Squamous Epithelial / LPF RARE  RARE   WBC, UA 0-2  <3 WBC/hpf   RBC / HPF 0-2  <3 RBC/hpf   Bacteria, UA FEW (*) RARE   US Transvaginal Non-ob  11/24/2012   CLINICAL DATA:  27 year old female with right lower quadrant pain and heavy bleeding. Recent LMP.  EXAM: TRANSABDOMINAL AND TRANSVAGINAL ULTRASOUND OF PELVIS  DOPPLER ULTRASOUND OF OVARIES   TECHNIQUE: Both transabdominal and transvaginal ultrasound examinations of the pelvis were performed. Transabdominal technique was performed for global imaging of the pelvis including uterus, ovaries, adnexal regions, and pelvic cul-de-sac.  It was necessary to proceed with endovaginal exam following the transabdominal exam to visualize the the endometrium and adnexa. Color and duplex Doppler ultrasound was utilized to evaluate blood flow to the ovaries.  COMPARISON:  CT Abdomen and Pelvis without contrast 08/13/2012.  FINDINGS: Uterus: Normal measuring 5.9 x 2.8 x 3.6 cm.  Endometrium: Anechoic fluid plus echogenic material in the endometrial canal. Endometrial thickness up to 4 mm.  Right ovary: Normal appearance/no adnexal mass. 2.0 x 1.6 x 2.2 cm.  Left ovary:  Normal appearance/no adnexal mass 2.5 x 1.6 x 1.7 cm.  Pulsed Doppler evaluation demonstrates normal low-resistance arterial and venous waveforms in both ovaries.  No pelvic free fluid.  IMPRESSION: Negative. Fluid and echogenic material in the endometrial canal likely physiologic/related to menses. No evidence of ovarian torsion.   Electronically Signed   By: Augusto Gamble   On: 11/24/2012 10:40   US Pelvis Complete  11/24/2012  CLINICAL DATA:  27 year old female with right lower quadrant pain and heavy bleeding. Recent LMP.  EXAM: TRANSABDOMINAL AND TRANSVAGINAL ULTRASOUND OF PELVIS  DOPPLER ULTRASOUND OF OVARIES  TECHNIQUE: Both transabdominal and transvaginal ultrasound examinations of the pelvis were performed. Transabdominal technique was performed for global imaging of the pelvis including uterus, ovaries, adnexal regions, and pelvic cul-de-sac.  It was necessary to proceed with endovaginal exam following the transabdominal exam to visualize the the endometrium and adnexa. Color and duplex Doppler ultrasound was utilized to evaluate blood flow to the ovaries.  COMPARISON:  CT Abdomen and Pelvis without contrast 08/13/2012.  FINDINGS: Uterus: Normal  measuring 5.9 x 2.8 x 3.6 cm.  Endometrium: Anechoic fluid plus echogenic material in the endometrial canal. Endometrial thickness up to 4 mm.  Right ovary: Normal appearance/no adnexal mass. 2.0 x 1.6 x 2.2 cm.  Left ovary:  Normal appearance/no adnexal mass 2.5 x 1.6 x 1.7 cm.  Pulsed Doppler evaluation demonstrates normal low-resistance arterial and venous waveforms in both ovaries.  No pelvic free fluid.  IMPRESSION: Negative. Fluid and echogenic material in the endometrial canal likely physiologic/related to menses. No evidence of ovarian torsion.   Electronically Signed   By: Augusto Gamble   On: 11/24/2012 10:40   [place] Art/ven Flow Abd Pelv Doppler  11/24/2012   CLINICAL DATA:  27 year old female with right lower quadrant pain and heavy bleeding. Recent LMP.  EXAM: TRANSABDOMINAL AND TRANSVAGINAL ULTRASOUND OF PELVIS  DOPPLER ULTRASOUND OF OVARIES  TECHNIQUE: Both transabdominal and transvaginal ultrasound examinations of the pelvis were performed. Transabdominal technique was performed for global imaging of the pelvis including uterus, ovaries, adnexal regions, and pelvic cul-de-sac.  It was necessary to proceed with endovaginal exam following the transabdominal exam to visualize the the endometrium and adnexa. Color and duplex Doppler ultrasound was utilized to evaluate blood flow to the ovaries.  COMPARISON:  CT Abdomen and Pelvis without contrast 08/13/2012.  FINDINGS: Uterus: Normal measuring 5.9 x 2.8 x 3.6 cm.  Endometrium: Anechoic fluid plus echogenic material in the endometrial canal. Endometrial thickness up to 4 mm.  Right ovary: Normal appearance/no adnexal mass. 2.0 x 1.6 x 2.2 cm.  Left ovary:  Normal appearance/no adnexal mass 2.5 x 1.6 x 1.7 cm.  Pulsed Doppler evaluation demonstrates normal low-resistance arterial and venous waveforms in both ovaries.  No pelvic free fluid.  IMPRESSION: Negative. Fluid and echogenic material in the endometrial canal likely physiologic/related to menses. No  evidence of ovarian torsion.   Electronically Signed   By: Augusto Gamble   On: 11/24/2012 10:40    MDM  I discussed lab work with patient. She is mildly anemic compared to June 08 2012 1 hemoglobin was 12.5. Plan continue ibuprofen, Norco. [facility] Cervical cultures pending Diagnosis #1 dysmenorrhea #2 anemia   Doug Sou, MD 11/24/12 1153

## 2012-11-24 NOTE — ED Notes (Signed)
Pt is requesting pain medication. 

## 2012-11-24 NOTE — ED Notes (Signed)
Pt returns from ultrasound

## 2012-11-24 NOTE — ED Notes (Signed)
Pt asking for pain medication once again.  8/10  EDP informed and no new orders received.  Pt informed of plan of care.

## 2012-11-24 NOTE — ED Notes (Signed)
Patient transported to Ultrasound 

## 2012-11-24 NOTE — ED Notes (Signed)
MD at bedside. 

## 2012-11-24 NOTE — ED Notes (Signed)
Pt reports vaginal bleeding and RLQ pain that started Tuesday.  States she was admitted to [facility] on Sunday and missed 2 doses of BCP and thyroid medication.

## 2012-11-28 ENCOUNTER — Telehealth (HOSPITAL_COMMUNITY): Payer: Self-pay

## 2012-11-28 ENCOUNTER — Ambulatory Visit (HOSPITAL_COMMUNITY): Payer: Self-pay | Admitting: Physician Assistant

## 2012-11-28 NOTE — Telephone Encounter (Signed)
9:43AM 11/28/12  Patient's mother called stating that [name] had an stressful situation and they had to take her to [facility] where she admitted for a few days this happen a week ago - after her release the patient was f/u by her pcp - per the pt's mother she doesn't want to come to her appt toady.  The pt's mother request a call back need to get some advice on what to do.   Please call patient.Marland KitchenMarguerite Day

## 2013-06-22 ENCOUNTER — Encounter (HOSPITAL_BASED_OUTPATIENT_CLINIC_OR_DEPARTMENT_OTHER): Payer: Self-pay | Admitting: Emergency Medicine

## 2013-06-22 ENCOUNTER — Emergency Department (HOSPITAL_BASED_OUTPATIENT_CLINIC_OR_DEPARTMENT_OTHER)
Admission: EM | Admit: 2013-06-22 | Discharge: 2013-06-22 | Disposition: A | Discharge status: Home / Self Care | Payer: Commercial Managed Care - PPO | Attending: Emergency Medicine | Admitting: Emergency Medicine

## 2013-06-22 DIAGNOSIS — G43909 Migraine, unspecified, not intractable, without status migrainosus: Secondary | ICD-10-CM | POA: Insufficient documentation

## 2013-06-22 DIAGNOSIS — Z3202 Encounter for pregnancy test, result negative: Secondary | ICD-10-CM | POA: Insufficient documentation

## 2013-06-22 DIAGNOSIS — K089 Disorder of teeth and supporting structures, unspecified: Secondary | ICD-10-CM | POA: Insufficient documentation

## 2013-06-22 DIAGNOSIS — E063 Autoimmune thyroiditis: Secondary | ICD-10-CM | POA: Insufficient documentation

## 2013-06-22 DIAGNOSIS — Z880 Allergy status to penicillin: Secondary | ICD-10-CM | POA: Insufficient documentation

## 2013-06-22 DIAGNOSIS — IMO0002 Reserved for concepts with insufficient information to code with codable children: Secondary | ICD-10-CM

## 2013-06-22 DIAGNOSIS — Z87442 Personal history of urinary calculi: Secondary | ICD-10-CM | POA: Insufficient documentation

## 2013-06-22 DIAGNOSIS — M542 Cervicalgia: Secondary | ICD-10-CM | POA: Insufficient documentation

## 2013-06-22 DIAGNOSIS — K029 Dental caries, unspecified: Secondary | ICD-10-CM | POA: Insufficient documentation

## 2013-06-22 DIAGNOSIS — R259 Unspecified abnormal involuntary movements: Secondary | ICD-10-CM | POA: Insufficient documentation

## 2013-06-22 DIAGNOSIS — F319 Bipolar disorder, unspecified: Secondary | ICD-10-CM | POA: Insufficient documentation

## 2013-06-22 DIAGNOSIS — Z79899 Other long term (current) drug therapy: Secondary | ICD-10-CM | POA: Insufficient documentation

## 2013-06-22 DIAGNOSIS — K0889 Other specified disorders of teeth and supporting structures: Secondary | ICD-10-CM

## 2013-06-22 HISTORY — DX: Unspecified psychosis not due to a substance or known physiological condition: F29

## 2013-06-22 LAB — COMPREHENSIVE METABOLIC PANEL
ALBUMIN: 4.3 g/dL (ref 3.5–5.2)
ALT: 12 U/L (ref 0–35)
AST: 12 U/L (ref 0–37)
Alkaline Phosphatase: 89 U/L (ref 39–117)
BUN: 15 mg/dL (ref 6–23)
CALCIUM: 10.2 mg/dL (ref 8.4–10.5)
CHLORIDE: 100 meq/L (ref 96–112)
CO2: 25 mEq/L (ref 19–32)
CREATININE: 1 mg/dL (ref 0.50–1.10)
GFR calc Af Amer: 89 mL/min — ABNORMAL LOW (ref >90)
GFR calc non Af Amer: 76 mL/min — ABNORMAL LOW (ref >90)
Glucose, Bld: 94 mg/dL (ref 70–99)
Potassium: 4 mEq/L (ref 3.7–5.3)
SODIUM: 143 meq/L (ref 137–147)
Total Bilirubin: <0.2 mg/dL — ABNORMAL LOW (ref 0.3–1.2)
Total Protein: 7.9 g/dL (ref 6.0–8.3)

## 2013-06-22 LAB — URINALYSIS, ROUTINE W REFLEX MICROSCOPIC
Bilirubin Urine: NEGATIVE
GLUCOSE, UA: NEGATIVE mg/dL
HGB URINE DIPSTICK: NEGATIVE
Ketones, ur: NEGATIVE mg/dL
Leukocytes, UA: NEGATIVE
Nitrite: NEGATIVE
PH: 7 (ref 5.0–8.0)
Protein, ur: NEGATIVE mg/dL
SPECIFIC GRAVITY, URINE: 1.009 (ref 1.005–1.030)
Urobilinogen, UA: 0.2 mg/dL (ref 0.0–1.0)

## 2013-06-22 LAB — CBC
HCT: 40.5 % (ref 36.0–46.0)
Hemoglobin: 13.4 g/dL (ref 12.0–15.0)
MCH: 27.3 pg (ref 26.0–34.0)
MCHC: 33.1 g/dL (ref 30.0–36.0)
MCV: 82.7 fL (ref 78.0–100.0)
PLATELETS: 275 10*3/uL (ref 150–400)
RBC: 4.9 MIL/uL (ref 3.87–5.11)
RDW: 12.9 % (ref 11.5–15.5)
WBC: 6.1 10*3/uL (ref 4.0–10.5)

## 2013-06-22 LAB — PREGNANCY, URINE: PREG TEST UR: NEGATIVE

## 2013-06-22 MED ORDER — CLINDAMYCIN HCL 150 MG PO CAPS: 150.0000 mg | ORAL_CAPSULE | Freq: Four times a day (QID) | ORAL | Status: DC

## 2013-06-22 MED ORDER — HYDROMORPHONE HCL PF 1 MG/ML IJ SOLN
1.0000 mg | Freq: Once | INTRAMUSCULAR | Status: AC
  Administered 2013-06-22: 1 mg via INTRAVENOUS
  Filled 2013-06-22: qty 1

## 2013-06-22 MED ORDER — BENZTROPINE MESYLATE 1 MG/ML IJ SOLN
0.5000 mg | Freq: Once | INTRAMUSCULAR | Status: AC
  Administered 2013-06-22: 0.5 mg via INTRAVENOUS
  Filled 2013-06-22: qty 2

## 2013-06-22 NOTE — ED Notes (Signed)
Pt has had unusual head jerking/tremors x4 days.  pmd sent her here for eval.

## 2013-06-22 NOTE — ED Provider Notes (Signed)
CSN: [id number]     Arrival date & time 06/22/13  1754 History   First MD Initiated Contact with Patient 06/22/13 1826     Chief Complaint  Patient presents with  . Tremors     (Consider location/radiation/quality/duration/timing/severity/associated sxs/prior Treatment) HPI Comments: Pt states that over the last 4 days she has developed a head jerking. Pt states that it was intermittent but now it is more persistent. Pt states that she was seen by pcp 2 days ago and was told increased on zyprexa. Pt was told to come to the er today for continued symptoms. Pt denies vomiting, confusion, fever, cough, diarrhea, cp, sob. Pt states that her neck is hurting from all the jerking. Pt states that she took benadryl without relief  The history is provided by the patient and a parent. No language interpreter was used.    Past Medical History  Diagnosis Date  . Fibromyalgia   . Depression   . Ileus     x3-4  . Hashimoto's thyroiditis     2005ish  . Hyperparathyroidism, unspecified   . Kidney stone     2013  . Bipolar disorder   . GERD (gastroesophageal reflux disease)   . Migraines     occasional aura  . Partial edentulism   . Torticollis   . Psychosis    Past Surgical History  Procedure Laterality Date  . Appendicitis      2003  . Dilation and curettage of uterus      1999  . Parathyroidectomy     Family History  Problem Relation Age of Onset  . Hypertension Father   . Coronary artery disease Paternal Grandfather     smoker  . Cancer Maternal Grandfather     MM  . Diabetes Paternal Grandfather     Medication induced  . Hypertension Maternal Grandmother   . Migraines Mother   . Rheum arthritis Mother     undifferentiated connective tissue   History  Substance Use Topics  . Smoking status: Never Smoker   . Smokeless tobacco: Not on file  . Alcohol Use: No   OB History   Grav Para Term Preterm Abortions TAB SAB Ect Mult Living                 Review of Systems   Constitutional: Negative.   Respiratory: Negative.   Cardiovascular: Negative.       Allergies  Amoxicillin; Demerol; Imitrex; Penicillins; Sulfa antibiotics; Depacon; Depakote; Dihydroergotamine; Metoclopramide; and Morphine and related  Home Medications   Current Outpatient Rx  Name  Route  Sig  Dispense  Refill  . busPIRone (BUSPAR) 15 MG tablet   Oral   Take 15 mg by mouth 3 (three) times daily.         . calcitRIOL (ROCALTROL) 0.5 MCG capsule   Oral   Take 0.5 mcg by mouth daily.         . chlorproMAZINE (THORAZINE) 200 MG tablet      TAKE 1 TABLET BY MOUTH AT BEDTIME   30 tablet   1   . chlorproMAZINE (THORAZINE) 50 MG tablet      TAKE 1 TABLET BY MOUTH TWICE DAILY   60 tablet   1   . cholecalciferol (VITAMIN D) 1000 UNITS tablet   Oral   Take 1 tablet (1,000 Units total) by mouth daily. For nutritional supplement.         . docusate sodium (COLACE) 100 MG capsule   Oral  Take 100 mg by mouth 2 (two) times daily.         Marland Kitchen gabapentin (NEURONTIN) 400 MG capsule      TAKE ONE CAPSULE BY MOUTH THREE TIMES DAILY FOR ANXIETY AND PANIC   90 capsule   0   . ibuprofen (ADVIL,MOTRIN) 800 MG tablet   Oral   Take 800 mg by mouth every 8 (eight) hours as needed for pain.         Marland Kitchen lamoTRIgine (LAMICTAL) 200 MG tablet   Oral   Take 1 tablet (200 mg total) by mouth at bedtime. For mood lability.   60 tablet   1   . levothyroxine (SYNTHROID, LEVOTHROID) 175 MCG tablet   Oral   Take 175 mcg by mouth daily before breakfast.         . norethindrone-ethinyl estradiol (MICROGESTIN,JUNEL,LOESTRIN) 1-20 MG-MCG tablet   Oral   Take 1 tablet by mouth daily.         Marland Kitchen tiZANidine (ZANAFLEX) 4 MG tablet   Oral   Take 4 mg by mouth every 6 (six) hours as needed (neck pain).         Marland Kitchen venlafaxine XR (EFFEXOR-XR) 150 MG 24 hr capsule      TAKE 2 CAPSULES BY MOUTH DAILY   60 capsule   5   . zonisamide (ZONEGRAN) 100 MG capsule   Oral   Take 100  mg by mouth daily.          BP 130/83  Pulse 97  Temp(Src) 97.7 F (36.5 C) (Oral)  Resp 16  Ht 5\' 4"  (1.626 m)  Wt 192 lb (87.091 kg)  BMI 32.94 kg/m2  SpO2 100%  LMP 05/04/2013 Physical Exam  Nursing note and vitals reviewed. Constitutional: She is oriented to person, place, and time. She appears well-developed and well-nourished.  HENT:  Head: Normocephalic and atraumatic.  Decay and tenderness noted to the left front tooth. No dental swelling noted  Eyes: Conjunctivae and EOM are normal. Pupils are equal, round, and reactive to light.  Cardiovascular: Normal rate and regular rhythm.   Pulmonary/Chest: Effort normal and breath sounds normal.  Musculoskeletal: Normal range of motion.  Neurological: She is alert and oriented to person, place, and time. Coordination normal.  Pt has a very subtle twitching of the neck to the left side that is intermittent  Psychiatric:  Flat affect    ED Course  Procedures (including critical care time) Labs Review Labs Reviewed  COMPREHENSIVE METABOLIC PANEL - Abnormal; Notable for the following:    Total Bilirubin <0.2 (*)    GFR calc non Af Amer 76 (*)    GFR calc Af Amer 89 (*)    All other components within normal limits  CBC  URINALYSIS, ROUTINE W REFLEX MICROSCOPIC  PREGNANCY, URINE   Imaging Review No results found.   EKG Interpretation None      MDM   Final diagnoses:  Tremor, involuntary spasm, or fasciculation  Toothache    Pt is doing better at this time. Tremor has decreased significantly. Pt is going to take benadryl at home and follow up with pcp    Glendell Docker, NP 06/22/13 2125

## 2013-06-22 NOTE — Discharge Instructions (Signed)
Dental Pain A tooth ache may be caused by cavities (tooth decay). Cavities expose the nerve of the tooth to air and hot or cold temperatures. It may come from an infection or abscess (also called a boil or furuncle) around your tooth. It is also often caused by dental caries (tooth decay). This causes the pain you are having. DIAGNOSIS  Your caregiver can diagnose this problem by exam. TREATMENT   If caused by an infection, it may be treated with medications which kill germs (antibiotics) and pain medications as prescribed by your caregiver. Take medications as directed.  Only take over-the-counter or prescription medicines for pain, discomfort, or fever as directed by your caregiver.  Whether the tooth ache today is caused by infection or dental disease, you should see your dentist as soon as possible for further care. SEEK MEDICAL CARE IF: The exam and treatment you received today has been provided on an emergency basis only. This is not a substitute for complete medical or dental care. If your problem worsens or new problems (symptoms) appear, and you are unable to meet with your dentist, call or return to this location. SEEK IMMEDIATE MEDICAL CARE IF:   You have a fever.  You develop redness and swelling of your face, jaw, or neck.  You are unable to open your mouth.  You have severe pain uncontrolled by pain medicine. MAKE SURE YOU:   Understand these instructions.  Will watch your condition.  Will get help right away if you are not doing well or get worse. Document Released: 03/22/2005 Document Revised: 06/14/2011 Document Reviewed: 11/08/2007 [facility] Patient Information 2014 [place].  Tremor Tremor is a rhythmic, involuntary muscular contraction characterized by oscillations (to-and-fro movements) of a part of the body. The most common of all involuntary movements, tremor can affect various body parts such as the hands, head, facial structures, vocal cords, trunk, and  legs; most tremors, however, occur in the hands. Tremor often accompanies neurological disorders associated with aging. Although the disorder is not life-threatening, it can be responsible for functional disability and social embarrassment. TREATMENT  There are many types of tremor and several ways in which tremor is classified. The most common classification is by behavioral context or position. There are five categories of tremor within this classification: resting, postural, kinetic, task-specific, and psychogenic. Resting or static tremor occurs when the muscle is at rest, for example when the hands are lying on the lap. This type of tremor is often seen in patients with Parkinson's disease. Postural tremor occurs when a patient attempts to maintain posture, such as holding the hands outstretched. Postural tremors include physiological tremor, essential tremor, tremor with basal ganglia disease (also seen in patients with Parkinson's disease), cerebellar postural tremor, tremor with peripheral neuropathy, post-traumatic tremor, and alcoholic tremor. Kinetic or intention (action) tremor occurs during purposeful movement, for example during finger-to-nose testing. Task-specific tremor appears when performing goal-oriented tasks such as handwriting, speaking, or standing. This group consists of primary writing tremor, vocal tremor, and orthostatic tremor. Psychogenic tremor occurs in both older and younger patients. The key feature of this tremor is that it dramatically lessens or disappears when the patient is distracted. PROGNOSIS There are some treatment options available for tremor; the appropriate treatment depends on accurate diagnosis of the cause. Some tremors respond to treatment of the underlying condition, for example in some cases of psychogenic tremor treating the patient's underlying mental problem may cause the tremor to disappear. Also, patients with tremor due to Parkinson's disease may  be  treated with Levodopa drug therapy. Symptomatic drug therapy is available for several other tremors as well. For those cases of tremor in which there is no effective drug treatment, physical measures such as teaching the patient to brace the affected limb during the tremor are sometimes useful. Surgical intervention such as thalamotomy or deep brain stimulation may be useful in certain cases. Document Released: 03/12/2002 Document Revised: 06/14/2011 Document Reviewed: 03/22/2005 [facility] Patient Information 2014 [place].

## 2013-06-22 NOTE — ED Notes (Signed)
Pt ambulatory to restroom, steady gait.

## 2013-06-23 NOTE — ED Provider Notes (Signed)
Medical screening examination/treatment/procedure(s) were conducted as a shared visit with non-physician practitioner(s) and myself.  I personally evaluated the patient during the encounter.   EKG Interpretation None      I interviewed and examined the patient. Lungs are CTAB. Cardiac exam wnl. Abdomen soft.  Subtle twitching of the neck to the left noted on my exam. VS unremarkable. Pt appears well otherwise. Doubt serotonin syn. Likely medication side effect as she has had similar sx in the past. Will get screening labs and rec close f/u w/ her psychiatrist.   Blanchard Kelch, MD 06/23/13 [phone]

## 2014-03-01 ENCOUNTER — Emergency Department (HOSPITAL_BASED_OUTPATIENT_CLINIC_OR_DEPARTMENT_OTHER)
Admission: EM | Admit: 2014-03-01 | Discharge: 2014-03-02 | Disposition: A | Discharge status: Home / Self Care | Payer: Commercial Managed Care - PPO | Attending: Emergency Medicine | Admitting: Emergency Medicine

## 2014-03-01 ENCOUNTER — Encounter (HOSPITAL_BASED_OUTPATIENT_CLINIC_OR_DEPARTMENT_OTHER): Payer: Self-pay | Admitting: Emergency Medicine

## 2014-03-01 DIAGNOSIS — R312 Other microscopic hematuria: Secondary | ICD-10-CM | POA: Insufficient documentation

## 2014-03-01 DIAGNOSIS — R000 Tachycardia, unspecified: Secondary | ICD-10-CM | POA: Insufficient documentation

## 2014-03-01 DIAGNOSIS — M797 Fibromyalgia: Secondary | ICD-10-CM | POA: Insufficient documentation

## 2014-03-01 DIAGNOSIS — R0789 Other chest pain: Secondary | ICD-10-CM | POA: Insufficient documentation

## 2014-03-01 DIAGNOSIS — Z793 Long term (current) use of hormonal contraceptives: Secondary | ICD-10-CM | POA: Insufficient documentation

## 2014-03-01 DIAGNOSIS — Z880 Allergy status to penicillin: Secondary | ICD-10-CM | POA: Diagnosis not present

## 2014-03-01 DIAGNOSIS — R3129 Other microscopic hematuria: Secondary | ICD-10-CM

## 2014-03-01 DIAGNOSIS — E213 Hyperparathyroidism, unspecified: Secondary | ICD-10-CM | POA: Insufficient documentation

## 2014-03-01 DIAGNOSIS — Z87442 Personal history of urinary calculi: Secondary | ICD-10-CM | POA: Insufficient documentation

## 2014-03-01 DIAGNOSIS — Z86012 Personal history of benign carcinoid tumor: Secondary | ICD-10-CM | POA: Insufficient documentation

## 2014-03-01 DIAGNOSIS — Z85858 Personal history of malignant neoplasm of other endocrine glands: Secondary | ICD-10-CM | POA: Diagnosis not present

## 2014-03-01 DIAGNOSIS — F319 Bipolar disorder, unspecified: Secondary | ICD-10-CM | POA: Diagnosis not present

## 2014-03-01 DIAGNOSIS — Z8719 Personal history of other diseases of the digestive system: Secondary | ICD-10-CM | POA: Insufficient documentation

## 2014-03-01 DIAGNOSIS — Z79899 Other long term (current) drug therapy: Secondary | ICD-10-CM | POA: Insufficient documentation

## 2014-03-01 DIAGNOSIS — R002 Palpitations: Secondary | ICD-10-CM | POA: Diagnosis present

## 2014-03-01 DIAGNOSIS — Z3202 Encounter for pregnancy test, result negative: Secondary | ICD-10-CM | POA: Insufficient documentation

## 2014-03-01 DIAGNOSIS — E063 Autoimmune thyroiditis: Secondary | ICD-10-CM | POA: Insufficient documentation

## 2014-03-01 DIAGNOSIS — Z792 Long term (current) use of antibiotics: Secondary | ICD-10-CM | POA: Diagnosis not present

## 2014-03-01 DIAGNOSIS — G43909 Migraine, unspecified, not intractable, without status migrainosus: Secondary | ICD-10-CM | POA: Diagnosis not present

## 2014-03-01 HISTORY — DX: Multiple endocrine neoplasia (MEN) type I: E31.21

## 2014-03-01 NOTE — ED Notes (Signed)
Patient reports with multiple days of palpitations. Today very tired - patient also reports that she has a HA and urinary systems.

## 2014-03-02 ENCOUNTER — Encounter (HOSPITAL_BASED_OUTPATIENT_CLINIC_OR_DEPARTMENT_OTHER): Payer: Self-pay | Admitting: Emergency Medicine

## 2014-03-02 DIAGNOSIS — R0789 Other chest pain: Secondary | ICD-10-CM | POA: Diagnosis not present

## 2014-03-02 LAB — COMPREHENSIVE METABOLIC PANEL
ALBUMIN: 4 g/dL (ref 3.5–5.2)
ALK PHOS: 97 U/L (ref 39–117)
ALT: 11 U/L (ref 0–35)
AST: 13 U/L (ref 0–37)
Anion gap: 14 (ref 5–15)
BUN: 14 mg/dL (ref 6–23)
CO2: 25 mEq/L (ref 19–32)
Calcium: 9.6 mg/dL (ref 8.4–10.5)
Chloride: 104 mEq/L (ref 96–112)
Creatinine, Ser: 1.1 mg/dL (ref 0.50–1.10)
GFR calc non Af Amer: 68 mL/min — ABNORMAL LOW (ref >90)
GFR, EST AFRICAN AMERICAN: 79 mL/min — AB (ref >90)
GLUCOSE: 103 mg/dL — AB (ref 70–99)
POTASSIUM: 3.9 meq/L (ref 3.7–5.3)
SODIUM: 143 meq/L (ref 137–147)
TOTAL PROTEIN: 7.6 g/dL (ref 6.0–8.3)
Total Bilirubin: <0.2 mg/dL — ABNORMAL LOW (ref 0.3–1.2)

## 2014-03-02 LAB — URINALYSIS, ROUTINE W REFLEX MICROSCOPIC
Bilirubin Urine: NEGATIVE
Glucose, UA: NEGATIVE mg/dL
KETONES UR: NEGATIVE mg/dL
LEUKOCYTES UA: NEGATIVE
NITRITE: NEGATIVE
PH: 6.5 (ref 5.0–8.0)
Protein, ur: NEGATIVE mg/dL
SPECIFIC GRAVITY, URINE: 1.021 (ref 1.005–1.030)
Urobilinogen, UA: 0.2 mg/dL (ref 0.0–1.0)

## 2014-03-02 LAB — CBC WITH DIFFERENTIAL/PLATELET
Basophils Absolute: 0 10*3/uL (ref 0.0–0.1)
Basophils Relative: 0 % (ref 0–1)
EOS ABS: 0.1 10*3/uL (ref 0.0–0.7)
Eosinophils Relative: 2 % (ref 0–5)
HCT: 38.3 % (ref 36.0–46.0)
Hemoglobin: 12.3 g/dL (ref 12.0–15.0)
LYMPHS ABS: 1.4 10*3/uL (ref 0.7–4.0)
Lymphocytes Relative: 21 % (ref 12–46)
MCH: 26.1 pg (ref 26.0–34.0)
MCHC: 32.1 g/dL (ref 30.0–36.0)
MCV: 81.1 fL (ref 78.0–100.0)
Monocytes Absolute: 0.6 10*3/uL (ref 0.1–1.0)
Monocytes Relative: 9 % (ref 3–12)
NEUTROS PCT: 68 % (ref 43–77)
Neutro Abs: 4.7 10*3/uL (ref 1.7–7.7)
PLATELETS: 229 10*3/uL (ref 150–400)
RBC: 4.72 MIL/uL (ref 3.87–5.11)
RDW: 14.1 % (ref 11.5–15.5)
WBC: 6.8 10*3/uL (ref 4.0–10.5)

## 2014-03-02 LAB — TROPONIN I: Troponin I: <0.3 ng/mL (ref <0.30)

## 2014-03-02 LAB — PREGNANCY, URINE: Preg Test, Ur: NEGATIVE

## 2014-03-02 LAB — URINE MICROSCOPIC-ADD ON

## 2014-03-02 MED ORDER — HYDROCODONE-ACETAMINOPHEN 5-325 MG PO TABS: 1.0000 | ORAL_TABLET | Freq: Four times a day (QID) | ORAL | Status: DC | PRN

## 2014-03-02 MED ORDER — SODIUM CHLORIDE 0.9 % IV BOLUS (SEPSIS)
1000.0000 mL | Freq: Once | INTRAVENOUS | Status: AC
  Administered 2014-03-02: 1000 mL via INTRAVENOUS

## 2014-03-02 MED ORDER — FENTANYL CITRATE 0.05 MG/ML IJ SOLN
100.0000 ug | Freq: Once | INTRAMUSCULAR | Status: AC
  Administered 2014-03-02: 100 ug via INTRAVENOUS
  Filled 2014-03-02: qty 2

## 2014-03-02 MED ORDER — ONDANSETRON HCL 4 MG/2ML IJ SOLN
4.0000 mg | Freq: Once | INTRAMUSCULAR | Status: AC
  Administered 2014-03-02: 4 mg via INTRAVENOUS
  Filled 2014-03-02: qty 2

## 2014-03-02 MED ORDER — PROCHLORPERAZINE MALEATE 10 MG PO TABS: 10.0000 mg | ORAL_TABLET | Freq: Four times a day (QID) | ORAL | Status: DC | PRN

## 2014-03-02 MED ORDER — PROCHLORPERAZINE EDISYLATE 5 MG/ML IJ SOLN
10.0000 mg | Freq: Once | INTRAMUSCULAR | Status: DC
  Filled 2014-03-02: qty 2

## 2014-03-02 MED ORDER — ONDANSETRON HCL 4 MG/2ML IJ SOLN
4.0000 mg | Freq: Once | INTRAMUSCULAR | Status: AC
  Administered 2014-03-02: 4 mg via INTRAVENOUS

## 2014-03-02 MED ORDER — ONDANSETRON HCL 4 MG/2ML IJ SOLN
INTRAMUSCULAR | Status: AC
  Filled 2014-03-02: qty 2

## 2014-03-02 NOTE — ED Provider Notes (Addendum)
CSN: [id number]     Arrival date & time 03/01/14  2208 History   First MD Initiated Contact with Patient 03/02/14 0101     Chief Complaint  Patient presents with  . Palpitations     (Consider location/radiation/quality/duration/timing/severity/associated sxs/prior Treatment) HPI  This is a 28 year old female with a history of Multiple Endocrine Neoplasm syndrome type I. She has a known adrenal tumor and has been having pain in her flanks bilaterally. She is here now with palpitations by which she means her heart has been beating rapidly. She is also having pain in her chest. The pain is a sharp, well localized pain below her left breast which is not pleuritic. She is also having nausea and a headache consistent with previous migraines. She rates her symptoms as moderate to severe. She is currently undergoing a workup for a possible adrenal tumor. She is on metoprolol for control of her chronic sinus tachycardia. Her mother states her thyroid was checked recently and is within normal limits. She had dark urine yesterday but this has subsequently cleared.  Past Medical History  Diagnosis Date  . Fibromyalgia   . Depression   . Ileus     x3-4  . Hashimoto's thyroiditis     2005ish  . Hyperparathyroidism, unspecified   . Kidney stone     2013  . Bipolar disorder   . GERD (gastroesophageal reflux disease)   . Migraines     occasional aura  . Partial edentulism   . Torticollis   . Psychosis    Past Surgical History  Procedure Laterality Date  . Appendicitis      2003  . Dilation and curettage of uterus      1999  . Parathyroidectomy     Family History  Problem Relation Age of Onset  . Hypertension Father   . Coronary artery disease Paternal Grandfather     smoker  . Cancer Maternal Grandfather     MM  . Diabetes Paternal Grandfather     Medication induced  . Hypertension Maternal Grandmother   . Migraines Mother   . Rheum arthritis Mother     undifferentiated connective  tissue   History  Substance Use Topics  . Smoking status: Never Smoker   . Smokeless tobacco: Not on file  . Alcohol Use: No   OB History    No data available     Review of Systems  All other systems reviewed and are negative.   Allergies  Amoxicillin; Demerol; Imitrex; Penicillins; Sulfa antibiotics; Depacon; Depakote; Dihydroergotamine; Metoclopramide; and Morphine and related  Home Medications   Prior to Admission medications   Medication Sig Start Date End Date Taking? Authorizing Provider  busPIRone (BUSPAR) 15 MG tablet Take 15 mg by mouth 3 (three) times daily.    Historical Provider, MD  calcitRIOL (ROCALTROL) 0.5 MCG capsule Take 0.5 mcg by mouth daily.    Historical Provider, MD  chlorproMAZINE (THORAZINE) 200 MG tablet TAKE 1 TABLET BY MOUTH AT BEDTIME 10/19/12   Jimmye Norman, PA-C  chlorproMAZINE (THORAZINE) 50 MG tablet TAKE 1 TABLET BY MOUTH TWICE DAILY 10/19/12   Jimmye Norman, PA-C  cholecalciferol (VITAMIN D) 1000 UNITS tablet Take 1 tablet (1,000 Units total) by mouth daily. For nutritional supplement. 06/16/12   Nena Polio, PA-C  clindamycin (CLEOCIN) 150 MG capsule Take 1 capsule (150 mg total) by mouth every 6 (six) hours. 06/22/13   Glendell Docker, NP  docusate sodium (COLACE) 100 MG capsule Take 100 mg by mouth 2 (two) times  daily.    Historical Provider, MD  gabapentin (NEURONTIN) 400 MG capsule TAKE ONE CAPSULE BY MOUTH THREE TIMES DAILY FOR ANXIETY AND PANIC 11/07/12   Jimmye Norman, PA-C  ibuprofen (ADVIL,MOTRIN) 800 MG tablet Take 800 mg by mouth every 8 (eight) hours as needed for pain.    Historical Provider, MD  lamoTRIgine (LAMICTAL) 200 MG tablet Take 1 tablet (200 mg total) by mouth at bedtime. For mood lability. 07/26/12   Jimmye Norman, PA-C  levothyroxine (SYNTHROID, LEVOTHROID) 175 MCG tablet Take 175 mcg by mouth daily before breakfast.    Historical Provider, MD  norethindrone-ethinyl estradiol (MICROGESTIN,JUNEL,LOESTRIN) 1-20 MG-MCG tablet Take 1 tablet by  mouth daily.    Historical Provider, MD  tiZANidine (ZANAFLEX) 4 MG tablet Take 4 mg by mouth every 6 (six) hours as needed (neck pain).    Historical Provider, MD  venlafaxine XR (EFFEXOR-XR) 150 MG 24 hr capsule TAKE 2 CAPSULES BY MOUTH DAILY 10/13/12   Jimmye Norman, PA-C  zonisamide (ZONEGRAN) 100 MG capsule Take 100 mg by mouth daily.    Historical Provider, MD   BP 109/90 mmHg  Pulse 87  Temp(Src) 97.9 F (36.6 C) (Oral)  Resp 20  Wt 211 lb (95.709 kg)  SpO2 100%  LMP 02/03/2014   Physical Exam  General: Well-developed, well-nourished female in no acute distress; appearance consistent with age of record HENT: normocephalic; atraumatic Eyes: pupils equal, round and reactive to light; extraocular muscles intact Neck: supple Heart: regular rate and rhythm; no ectopy; occasional tachycardia Lungs: clear to auscultation bilaterally Chest: Chest wall tenderness below left breast Abdomen: soft; nondistended; nontender; no masses or hepatosplenomegaly; bowel sounds present Extremities: No deformity; full range of motion; pulses normal Neurologic: Awake, alert and oriented; motor function intact in all extremities and symmetric; no facial droop Skin: Warm and dry Psychiatric: Flat affect    ED Course  Procedures (including critical care time)   EKG Interpretation   Date/Time:  Friday March 01 2014 22:23:42 EST Ventricular Rate:  101 PR Interval:  158 QRS Duration: 84 QT Interval:  356 QTC Calculation: 461 R Axis:   115 Text Interpretation:  Sinus tachycardia Right axis deviation Abnormal ECG  Rate is faster Confirmed by Rosmarie Esquibel  MD, Jenny Reichmann ([id number]) on 03/02/2014 1:01:45  AM      MDM   Nursing notes and vitals signs, including pulse oximetry, reviewed.  Summary of this visit's results, reviewed by myself:  Labs:  Results for orders placed or performed during the hospital encounter of 03/01/14 (from the past 24 hour(s))  CBC with Differential     Status: None    Collection Time: 03/02/14 12:43 AM  Result Value Ref Range   WBC 6.8 4.0 - 10.5 K/uL   RBC 4.72 3.87 - 5.11 MIL/uL   Hemoglobin 12.3 12.0 - 15.0 g/dL   HCT 38.3 36.0 - 46.0 %   MCV 81.1 78.0 - 100.0 fL   MCH 26.1 26.0 - 34.0 pg   MCHC 32.1 30.0 - 36.0 g/dL   RDW 14.1 11.5 - 15.5 %   Platelets 229 150 - 400 K/uL   Neutrophils Relative % 68 43 - 77 %   Neutro Abs 4.7 1.7 - 7.7 K/uL   Lymphocytes Relative 21 12 - 46 %   Lymphs Abs 1.4 0.7 - 4.0 K/uL   Monocytes Relative 9 3 - 12 %   Monocytes Absolute 0.6 0.1 - 1.0 K/uL   Eosinophils Relative 2 0 - 5 %   Eosinophils Absolute 0.1 0.0 -  0.7 K/uL   Basophils Relative 0 0 - 1 %   Basophils Absolute 0.0 0.0 - 0.1 K/uL  Comprehensive metabolic panel     Status: Abnormal   Collection Time: 03/02/14 12:43 AM  Result Value Ref Range   Sodium 143 137 - 147 mEq/L   Potassium 3.9 3.7 - 5.3 mEq/L   Chloride 104 96 - 112 mEq/L   CO2 25 19 - 32 mEq/L   Glucose, Bld 103 (H) 70 - 99 mg/dL   BUN 14 6 - 23 mg/dL   Creatinine, Ser 1.10 0.50 - 1.10 mg/dL   Calcium 9.6 8.4 - 10.5 mg/dL   Total Protein 7.6 6.0 - 8.3 g/dL   Albumin 4.0 3.5 - 5.2 g/dL   AST 13 0 - 37 U/L   ALT 11 0 - 35 U/L   Alkaline Phosphatase 97 39 - 117 U/L   Total Bilirubin <0.2 (L) 0.3 - 1.2 mg/dL   GFR calc non Af Amer 68 (L) >90 mL/min   GFR calc Af Amer 79 (L) >90 mL/min   Anion gap 14 5 - 15  Troponin I     Status: None   Collection Time: 03/02/14 12:43 AM  Result Value Ref Range   Troponin I <0.30 <0.30 ng/mL  Urinalysis, Routine w reflex microscopic     Status: Abnormal   Collection Time: 03/02/14  1:00 AM  Result Value Ref Range   Color, Urine YELLOW YELLOW   APPearance CLOUDY (A) CLEAR   Specific Gravity, Urine 1.021 1.005 - 1.030   pH 6.5 5.0 - 8.0   Glucose, UA NEGATIVE NEGATIVE mg/dL   Hgb urine dipstick LARGE (A) NEGATIVE   Bilirubin Urine NEGATIVE NEGATIVE   Ketones, ur NEGATIVE NEGATIVE mg/dL   Protein, ur NEGATIVE NEGATIVE mg/dL   Urobilinogen, UA  0.2 0.0 - 1.0 mg/dL   Nitrite NEGATIVE NEGATIVE   Leukocytes, UA NEGATIVE NEGATIVE  Pregnancy, urine     Status: None   Collection Time: 03/02/14  1:00 AM  Result Value Ref Range   Preg Test, Ur NEGATIVE NEGATIVE  Urine microscopic-add on     Status: Abnormal   Collection Time: 03/02/14  1:00 AM  Result Value Ref Range   Squamous Epithelial / LPF FEW (A) RARE   WBC, UA 0-2 <3 WBC/hpf   RBC / HPF 21-50 <3 RBC/hpf   Bacteria, UA FEW (A) RARE   2:10 AM  Patient still complaining of pain and nausea. Patient denies being on her menses. Microscopic hematuria may be related to nephrolithiasis. She has an MRI of the abdomen pending to evaluate her kidneys so we will avoid a CT at this time.   5:00 AM Patient feeling better after IV fluids and medications. As noted above she has an MRI of the abdomen pending. We will treat her symptomatically in the meantime.  Wynetta Fines, MD 03/02/14 1610  Wynetta Fines, MD 03/02/14 0500

## 2014-03-02 NOTE — Discharge Instructions (Signed)

## 2014-03-02 NOTE — ED Notes (Signed)
MD at bedside. 

## 2014-03-02 NOTE — ED Notes (Signed)
Pt placed on heart monitor.

## 2016-10-15 ENCOUNTER — Inpatient Hospital Stay (HOSPITAL_COMMUNITY): Admission: RE | Admit: 2016-10-15 | Payer: Self-pay | Source: Ambulatory Visit

## 2016-10-22 ENCOUNTER — Encounter (HOSPITAL_COMMUNITY): Admission: RE | Payer: Self-pay | Source: Ambulatory Visit

## 2016-10-22 ENCOUNTER — Ambulatory Visit (HOSPITAL_COMMUNITY)
Admission: RE | Admit: 2016-10-22 | Payer: Managed Care, Other (non HMO) | Source: Ambulatory Visit | Admitting: Oral Surgery

## 2016-10-22 SURGERY — DENTAL RESTORATION/EXTRACTION WITH X-RAY
Anesthesia: General

## 2016-11-17 ENCOUNTER — Encounter (HOSPITAL_COMMUNITY)
Admission: RE | Admit: 2016-11-17 | Discharge: 2016-11-17 | Disposition: A | Discharge status: Home / Self Care | Payer: Managed Care, Other (non HMO) | Source: Ambulatory Visit | Attending: Oral Surgery | Admitting: Oral Surgery

## 2016-11-17 ENCOUNTER — Encounter (HOSPITAL_COMMUNITY): Payer: Self-pay | Admitting: Oral Surgery

## 2016-11-17 ENCOUNTER — Encounter (HOSPITAL_COMMUNITY): Payer: Self-pay

## 2016-11-17 DIAGNOSIS — F319 Bipolar disorder, unspecified: Secondary | ICD-10-CM | POA: Insufficient documentation

## 2016-11-17 DIAGNOSIS — Z6836 Body mass index (BMI) 36.0-36.9, adult: Secondary | ICD-10-CM | POA: Diagnosis not present

## 2016-11-17 DIAGNOSIS — M797 Fibromyalgia: Secondary | ICD-10-CM

## 2016-11-17 DIAGNOSIS — Z01812 Encounter for preprocedural laboratory examination: Secondary | ICD-10-CM

## 2016-11-17 DIAGNOSIS — F209 Schizophrenia, unspecified: Secondary | ICD-10-CM | POA: Diagnosis not present

## 2016-11-17 DIAGNOSIS — G8929 Other chronic pain: Secondary | ICD-10-CM

## 2016-11-17 DIAGNOSIS — K219 Gastro-esophageal reflux disease without esophagitis: Secondary | ICD-10-CM | POA: Diagnosis not present

## 2016-11-17 DIAGNOSIS — K029 Dental caries, unspecified: Secondary | ICD-10-CM | POA: Diagnosis not present

## 2016-11-17 DIAGNOSIS — K08439 Partial loss of teeth due to caries, unspecified class: Secondary | ICD-10-CM | POA: Diagnosis not present

## 2016-11-17 DIAGNOSIS — Z85850 Personal history of malignant neoplasm of thyroid: Secondary | ICD-10-CM

## 2016-11-17 DIAGNOSIS — K0820 Unspecified atrophy of edentulous alveolar ridge: Secondary | ICD-10-CM | POA: Diagnosis not present

## 2016-11-17 DIAGNOSIS — E890 Postprocedural hypothyroidism: Secondary | ICD-10-CM

## 2016-11-17 DIAGNOSIS — E3121 Multiple endocrine neoplasia [MEN] type I: Secondary | ICD-10-CM

## 2016-11-17 DIAGNOSIS — Z7984 Long term (current) use of oral hypoglycemic drugs: Secondary | ICD-10-CM | POA: Diagnosis not present

## 2016-11-17 DIAGNOSIS — Z01818 Encounter for other preprocedural examination: Secondary | ICD-10-CM

## 2016-11-17 DIAGNOSIS — M436 Torticollis: Secondary | ICD-10-CM | POA: Diagnosis not present

## 2016-11-17 DIAGNOSIS — E892 Postprocedural hypoparathyroidism: Secondary | ICD-10-CM

## 2016-11-17 DIAGNOSIS — E282 Polycystic ovarian syndrome: Secondary | ICD-10-CM

## 2016-11-17 DIAGNOSIS — Z79899 Other long term (current) drug therapy: Secondary | ICD-10-CM | POA: Diagnosis not present

## 2016-11-17 DIAGNOSIS — I10 Essential (primary) hypertension: Secondary | ICD-10-CM

## 2016-11-17 DIAGNOSIS — Z888 Allergy status to other drugs, medicaments and biological substances status: Secondary | ICD-10-CM | POA: Diagnosis not present

## 2016-11-17 DIAGNOSIS — G43909 Migraine, unspecified, not intractable, without status migrainosus: Secondary | ICD-10-CM

## 2016-11-17 DIAGNOSIS — G8918 Other acute postprocedural pain: Secondary | ICD-10-CM | POA: Diagnosis not present

## 2016-11-17 DIAGNOSIS — E119 Type 2 diabetes mellitus without complications: Secondary | ICD-10-CM | POA: Diagnosis not present

## 2016-11-17 DIAGNOSIS — Z87442 Personal history of urinary calculi: Secondary | ICD-10-CM | POA: Diagnosis not present

## 2016-11-17 DIAGNOSIS — Z882 Allergy status to sulfonamides status: Secondary | ICD-10-CM | POA: Diagnosis not present

## 2016-11-17 DIAGNOSIS — K089 Disorder of teeth and supporting structures, unspecified: Secondary | ICD-10-CM | POA: Insufficient documentation

## 2016-11-17 DIAGNOSIS — Z91011 Allergy to milk products: Secondary | ICD-10-CM | POA: Diagnosis not present

## 2016-11-17 DIAGNOSIS — Z880 Allergy status to penicillin: Secondary | ICD-10-CM | POA: Diagnosis not present

## 2016-11-17 HISTORY — DX: Polycystic ovarian syndrome: E28.2

## 2016-11-17 HISTORY — DX: Malignant (primary) neoplasm, unspecified: C80.1

## 2016-11-17 HISTORY — DX: Other specified postprocedural states: Z98.890

## 2016-11-17 HISTORY — DX: Personal history of urinary calculi: Z87.442

## 2016-11-17 HISTORY — DX: Hypothyroidism, unspecified: E03.9

## 2016-11-17 HISTORY — DX: Other specified postprocedural states: R11.2

## 2016-11-17 HISTORY — DX: Cardiac arrhythmia, unspecified: I49.9

## 2016-11-17 HISTORY — DX: Hyperparathyroidism, unspecified: E21.3

## 2016-11-17 HISTORY — DX: Chronic pain syndrome: G89.4

## 2016-11-17 HISTORY — DX: Benign neoplasm of pituitary gland: D35.2

## 2016-11-17 LAB — CBC
HCT: 38.7 % (ref 36.0–46.0)
Hemoglobin: 12.3 g/dL (ref 12.0–15.0)
MCH: 25.1 pg — AB (ref 26.0–34.0)
MCHC: 31.8 g/dL (ref 30.0–36.0)
MCV: 79 fL (ref 78.0–100.0)
PLATELETS: 261 10*3/uL (ref 150–400)
RBC: 4.9 MIL/uL (ref 3.87–5.11)
RDW: 14.5 % (ref 11.5–15.5)
WBC: 8 10*3/uL (ref 4.0–10.5)

## 2016-11-17 LAB — GLUCOSE, CAPILLARY: GLUCOSE-CAPILLARY: 90 mg/dL (ref 65–99)

## 2016-11-17 LAB — BASIC METABOLIC PANEL
Anion gap: 11 (ref 5–15)
BUN: 7 mg/dL (ref 6–20)
CALCIUM: 9.3 mg/dL (ref 8.9–10.3)
CHLORIDE: 107 mmol/L (ref 101–111)
CO2: 21 mmol/L — ABNORMAL LOW (ref 22–32)
CREATININE: 1.01 mg/dL — AB (ref 0.44–1.00)
GFR calc Af Amer: >60 mL/min (ref >60)
Glucose, Bld: 91 mg/dL (ref 65–99)
Potassium: 4.1 mmol/L (ref 3.5–5.1)
SODIUM: 139 mmol/L (ref 135–145)

## 2016-11-17 LAB — HEMOGLOBIN A1C
HEMOGLOBIN A1C: 5.3 % (ref 4.8–5.6)
Mean Plasma Glucose: 105.41 mg/dL

## 2016-11-17 LAB — HCG, SERUM, QUALITATIVE: Preg, Serum: NEGATIVE

## 2016-11-17 NOTE — H&P (Signed)
HISTORY AND PHYSICAL  Ashlee Day is a 31 y.o. female patient with CC: Unable to eat and chew food properly.  No diagnosis found.  Past Medical History:  Diagnosis Date  . Bipolar disorder   . Depression   . Fibromyalgia   . GERD (gastroesophageal reflux disease)   . Hashimoto's thyroiditis    2005ish  . Hyperparathyroidism, unspecified   . Ileus    x3-4  . Kidney stone    2013  . MEN 1 (multiple endocrine neoplasia)   . Migraines    occasional aura  . Partial edentulism   . Psychosis   . Torticollis     No current facility-administered medications for this encounter.    Current Outpatient Prescriptions  Medication Sig Dispense Refill  . busPIRone (BUSPAR) 15 MG tablet Take 15 mg by mouth 3 (three) times daily.    . calcitRIOL (ROCALTROL) 0.5 MCG capsule Take 0.5 mcg by mouth daily.    . calcium carbonate (CALCI-CHEW) 1250 (500 Ca) MG chewable tablet Chew 1 tablet by mouth daily.    . cholecalciferol (VITAMIN D) 1000 UNITS tablet Take 1 tablet (1,000 Units total) by mouth daily. For nutritional supplement.    . clonazePAM (KLONOPIN) 0.5 MG tablet Take 0.25 mg by mouth at bedtime as needed for anxiety.    . cloZAPine (CLOZARIL) 100 MG tablet Take 150-200 mg by mouth See admin instructions. 150 mg in the morning. 200 mg at 3:00 pm. 175 mg at bedtime    . docusate sodium (COLACE) 100 MG capsule Take 100 mg by mouth daily as needed for moderate constipation.     Marland Kitchen labetalol (NORMODYNE) 100 MG tablet Take 1 tablet by mouth 3 (three) times daily.  3  . lamoTRIgine (LAMICTAL) 200 MG tablet Take 1 tablet (200 mg total) by mouth at bedtime. For mood lability. 60 tablet 1  . levothyroxine (SYNTHROID, LEVOTHROID) 125 MCG tablet Take 125 mcg by mouth daily before breakfast.     . Melatonin 3 MG TABS Take 9 mg by mouth at bedtime.    . metFORMIN (GLUCOPHAGE-XR) 500 MG 24 hr tablet Take 500 mg by mouth 4 (four) times daily.    . norethindrone-ethinyl estradiol  (MICROGESTIN,JUNEL,LOESTRIN) 1-20 MG-MCG tablet Take 1 tablet by mouth daily.    Marland Kitchen OLANZapine (ZYPREXA) 2.5 MG tablet Take 2.5-10 mg by mouth daily as needed.    . ondansetron (ZOFRAN) 4 MG tablet Take 4 mg by mouth every 8 (eight) hours as needed for nausea or vomiting.    . OxyCODONE ER (XTAMPZA ER) 9 MG C12A Take 1 tablet by mouth 2 (two) times daily.    . polyethylene glycol (MIRALAX / GLYCOLAX) packet Take 17 g by mouth daily.    . ranitidine (ZANTAC) 150 MG tablet Take 150 mg by mouth 2 (two) times daily.    Marland Kitchen tiZANidine (ZANAFLEX) 4 MG tablet Take 4 mg by mouth every 6 (six) hours as needed (neck pain).    . traMADol (ULTRAM) 50 MG tablet Take 50 mg by mouth 3 (three) times daily.    Marland Kitchen venlafaxine XR (EFFEXOR-XR) 150 MG 24 hr capsule Take 150 mg by mouth daily with breakfast.    . vitamin B-12 (CYANOCOBALAMIN) 1000 MCG tablet Take 1,000 mcg by mouth daily.     Allergies  Allergen Reactions  . Amoxicillin Other (See Comments)    Blisters all over Cannot take any 'cillins' at all  . Demerol [Meperidine] Nausea And Vomiting    Can not tolerate Iv administration  Can tolerate IM adminstration  . Imitrex [Sumatriptan]     Severity unknown  . Penicillins Other (See Comments)    Cannot not tolerate any cillins due to amoxicillin reaction   . Sulfa Antibiotics Nausea And Vomiting  . Depacon [Valproic Acid] Nausea And Vomiting and Rash  . Depakote [Divalproex Sodium] Nausea And Vomiting and Rash  . Dihydroergotamine Nausea And Vomiting and Rash  . Metoclopramide Nausea And Vomiting and Rash    React to IV form Can tolerate oral medication  . Morphine And Related Rash   Active Problems:   * No active hospital problems. *  Vitals: There were no vitals taken for this visit. Lab results:No results found for this or any previous visit (from the past 82 hour(s)). Radiology Results: No results found. General appearance: alert, cooperative, no distress and morbidly obese Head:  Normocephalic, without obvious abnormality, atraumatic Eyes: negative Nose: Nares normal. Septum midline. Mucosa normal. No drainage or sinus tenderness. Throat: Teeth # 7, 8, 9 remain in maxilla. Otherwise edentulous. No oral lesions noted. Narrow width and height of maxilla and mandible. pharynx clear. no trismus Neck: no adenopathy, supple, symmetrical, trachea midline and thyroid not enlarged, symmetric, no tenderness/mass/nodules Resp: clear to auscultation bilaterally Cardio: regular rate and rhythm, S1, S2 normal, no murmur, click, rub or gallop  Assessment: Nonrestorable teeth 7, 8, 9. Atrophy maxilla and maxilla edentulous.  Plan:Extracat 7, 8, 9 with alveoloplasty. Placement maxillary and mandibular implants.   Gae Bon 11/17/2016

## 2016-11-17 NOTE — Progress Notes (Addendum)
Requested echo done from [facility].   CARDIOLOGIST- DR. Gerarda Gunther IN [place]

## 2016-11-17 NOTE — Pre-Procedure Instructions (Addendum)
Ashlee Day  11/17/2016      [facility] [phone] - [place], [place] - [address] AT [address] [place] [address] Phone: [phone] Fax: [phone]  [name] [place], [place] - [address] AT [address] [place] [address] Phone: [phone] Fax: [phone]    Your procedure is scheduled on   Friday  11/19/16  Report to [facility] Admitting at 530 A.M.  Call this number if you have problems the morning of surgery:  [phone]   Remember:  Do not eat food or drink liquids after midnight.  Take these medicines the morning of surgery with A SIP OF WATER   BUSPIRONE (BUSPAR), CLOZARIL (CLOZAPINE), LABETALOL (NORMODYNE), LEVOTHYROXINE, NORETHINDRONE-ETHINYL ESTRADIOL, OXYCODONE OR TRAMADOL  IF NEEDED, RANITIDINE (ZANTAC), VENLAFAXINE (EFFEXOR), OLANZAPINE (ZYPREXA)  7 days prior to surgery STOP taking any Aspirin, Aleve, Naproxen, Ibuprofen, Motrin, Advil, Goody's, BC's, all herbal medications, fish oil, and all vitamins    How to Manage Your Diabetes Before and After Surgery  Why is it important to control my blood sugar before and after surgery? . Improving blood sugar levels before and after surgery helps healing and can limit problems. . A way of improving blood sugar control is eating a healthy diet by: o  Eating less sugar and carbohydrates o  Increasing activity/exercise o  Talking with your doctor about reaching your blood sugar goals . High blood sugars (greater than 180 mg/dL) can raise your risk of infections and slow your recovery, so you will need to focus on controlling your diabetes during the weeks before surgery. . Make sure that the doctor who takes care of your diabetes knows about your planned surgery including the date and location.  How do I manage my blood sugar before surgery? . Check your blood sugar at least 4 times a  day, starting 2 days before surgery, to make sure that the level is not too high or low. o Check your blood sugar the morning of your surgery when you wake up and every 2 hours until you get to the Short Stay unit. . If your blood sugar is less than 70 mg/dL, you will need to treat for low blood sugar: o Do not take insulin. o Treat a low blood sugar (less than 70 mg/dL) with  cup of clear juice (cranberry or apple), 4 glucose tablets, OR glucose gel. o Recheck blood sugar in 15 minutes after treatment (to make sure it is greater than 70 mg/dL). If your blood sugar is not greater than 70 mg/dL on recheck, call [phone] for further instructions. . Report your blood sugar to the short stay nurse when you get to Short Stay.  . If you are admitted to the hospital after surgery: o Your blood sugar will be checked by the staff and you will probably be given insulin after surgery (instead of oral diabetes medicines) to make sure you have good blood sugar levels. o The goal for blood sugar control after surgery is 80-180 mg/dL.              WHAT DO I DO ABOUT MY DIABETES MEDICATION?   Marland Kitchen Do not take oral diabetes medicines (pills) the morning of surgery.  . .       .   .   .   Other Instructions:  Patient Signature:  Date:   Nurse Signature:  Date:   Reviewed and Endorsed by [facility] Patient Education Committee, August 2015  Do not wear jewelry, make-up or nail polish.  Do not wear lotions, powders, or perfumes, or deoderant.  Do not shave 48 hours prior to surgery.  Men may shave face and neck.  Do not bring valuables to the hospital.  [facility] is not responsible for any belongings or valuables.  Contacts, dentures or bridgework may not be worn into surgery.  Leave your suitcase in the car.  After surgery it may be brought to your room.  For patients admitted to the hospital, discharge time will be determined by your treatment team.  Patients  discharged the day of surgery will not be allowed to drive home.   Name and phone number of your driver:    Special instructions:  [facility] - Preparing for Surgery  Before surgery, you can play an important role.  Because skin is not sterile, your skin needs to be as free of germs as possible.  You can reduce the number of germs on you skin by washing with CHG (chlorahexidine gluconate) soap before surgery.  CHG is an antiseptic cleaner which kills germs and bonds with the skin to continue killing germs even after washing.  Please DO NOT use if you have an allergy to CHG or antibacterial soaps.  If your skin becomes reddened/irritated stop using the CHG and inform your nurse when you arrive at Short Stay.  Do not shave (including legs and underarms) for at least 48 hours prior to the first CHG shower.  You may shave your face.  Please follow these instructions carefully:   1.  Shower with CHG Soap the night before surgery and the                                morning of Surgery.  2.  If you choose to wash your hair, wash your hair first as usual with your       normal shampoo.  3.  After you shampoo, rinse your hair and body thoroughly to remove the                      Shampoo.  4.  Use CHG as you would any other liquid soap.  You can apply chg directly       to the skin and wash gently with scrungie or a clean washcloth.  5.  Apply the CHG Soap to your body ONLY FROM THE NECK DOWN.        Do not use on open wounds or open sores.  Avoid contact with your eyes,       ears, mouth and genitals (private parts).  Wash genitals (private parts)       with your normal soap.  6.  Wash thoroughly, paying special attention to the area where your surgery        will be performed.  7.  Thoroughly rinse your body with warm water from the neck down.  8.  DO NOT shower/wash with your normal soap after using and rinsing off       the CHG Soap.  9.  Pat yourself dry with a clean towel.            10.  Wear  clean pajamas.  11.  Place clean sheets on your bed the night of your first shower and do not        sleep with pets.  Day of Surgery  Do not apply any lotions/deoderants the morning of surgery.  Please wear clean clothes to the hospital/surgery center.    Please read over the following fact sheets that you were given. Pain Booklet and Surgical Site Infection Prevention

## 2016-11-18 MED ORDER — CLINDAMYCIN PHOSPHATE 600 MG/50ML IV SOLN
600.0000 mg | Freq: Once | INTRAVENOUS | Status: AC
  Administered 2016-11-19: 600 mg via INTRAVENOUS
  Filled 2016-11-18: qty 50

## 2016-11-18 NOTE — Progress Notes (Signed)
Anesthesia Chart Review: Patient is a 31 year old female scheduled for placing eight implants on 11/19/16 by Dr. Diona Browner.  History includes never smoker, post-operative N/V, fibromyalgia/chronic pain, multiple endocrine neoplasia 1 syndrome, Hashimoto's thyroiditis, thyroid cancer s/p thyroidectomy 10/2012, post-surgical hypothyroidism, right parathyroid adenoma/hyperparathyroidism s/p parathyroidectomy '13, Bipolar disorder, psychosis, recurrent ileus, migraines, torticollis, polycystic ovarian syndrome, appendectomy, tachycardia (likely induced by MEN 1 syndrome). BMI is consistent with obesity.  - PCP is with [facility].  - Cardiologist is Dr. Verdell Face with [facility] (Care Everywhere). Last visit 07/08/16. At that time, he ordered an echo and if LVF normal then would be low risk for upcoming surgery(s). EF was > 55%.  - Endocrinologist is Dr. Liz Malady with [facility] (Care Everywhere).  - Psychiatrist is with [facility].  Meds include calcitriol, vitamin D, Klonopin, clozapine, labetalol, Lamictal, levothyroxine, melatonin, metformin, Zyprexa, Zofran, oxycodone ER, Zantac, Zanaflex, tramadol, Effexor XR, norethindrone-ethinyl estradiol.  Pulse (P) 61   Temp (P) 36.8 C   Resp (P) 20   Ht (P) 5\' 3"  (1.6 m)   Wt (P) 210 lb 8 oz (95.5 kg)   LMP 10/27/2016   SpO2 (P) 98%   BMI (P) 37.29 kg/m  BP was not recorded at PAT, but was 110/68 at 11/09/16 OB-GYN appointment.    EKG 11/17/16: NSR, rightward axis. Negative T waves in V1-2, overall stable when compared to 03/01/14 tracing.  Echo 09/06/16 [facility]): Summary: Left ventricular systolic function is normal. Ejection fraction equal to or greater than 55%. No regional wall motion abnormalities noted. No significant valvular regurgitation or stenosis. The intra-atrial septum is intact with no evidence for an atrial septal defect. RVSP is normal.  CT chest/abd/pelvis  09/18/16 ([facility]; Care Everywhere):  Findings: Chest: - No suspicious pulmonary nodules. No pleural effusion. No pneumothorax. - Heart size within normal limits. Aorta is normal in caliber. Normal takeoff great vessels. No pathologic hilar or mediastinal adenopathy by CT criteria. Abdomen and pelvis:  - Hepatic steatosis near the falciform ligament.No suspicious hepatic lesions are identified. Spleen, adrenals, pancreas, gallbladder are unremarkable. Abdominal aorta is normal in caliber. No evidence of paraganglioma. No pathologic retroperitoneal or mesenteric adenopathy by CT criteria. - No suspicious renal lesions. No hydronephrosis or nephrolithiasis. - Stomach and duodenum is unremarkable. Small bowel loops and colon are normal in caliber. - Moderate amount of colonic stool. - Bladder is decompressed. Uterus is anteverted. Follicles in the ovaries. - No aggressive appearing osseous lesions. Impression: No evidence of pheochromocytoma or paraganglioma.  Preoperative labs noted. A1c 5.3. Serum pregnancy negative.   If no acute changes then I anticipate that she can proceed as planned.  George Hugh [facility] Short Stay Center/Anesthesiology Phone [phone] 11/18/2016 2:24 PM

## 2016-11-18 NOTE — Anesthesia Preprocedure Evaluation (Addendum)
Anesthesia Evaluation  Patient identified by MRN, date of birth, ID band Patient awake    Reviewed: Allergy & Precautions, H&P , NPO status , Patient's Chart, lab work & pertinent test results, reviewed documented beta blocker date and time   History of Anesthesia Complications (+) PONV  Airway Mallampati: III  TM Distance: >3 FB Neck ROM: Full    Dental no notable dental hx. (+) Poor Dentition, Dental Advisory Given   Pulmonary neg pulmonary ROS,    Pulmonary exam normal breath sounds clear to auscultation       Cardiovascular Exercise Tolerance: Good hypertension, Pt. on medications and Pt. on home beta blockers  Rhythm:Regular Rate:Normal     Neuro/Psych  Headaches, Depression Bipolar Disorder Schizophrenia    GI/Hepatic Neg liver ROS, GERD  Medicated and Controlled,  Endo/Other  diabetes, Type 2, Oral Hypoglycemic AgentsHypothyroidism Morbid obesity  Renal/GU negative Renal ROS  negative genitourinary   Musculoskeletal  (+) Fibromyalgia -  Abdominal   Peds  Hematology negative hematology ROS (+)   Anesthesia Other Findings   Reproductive/Obstetrics negative OB ROS                            Anesthesia Physical Anesthesia Plan  ASA: III  Anesthesia Plan: General   Post-op Pain Management:    Induction: Intravenous  PONV Risk Score and Plan: 4 or greater and Ondansetron, Dexamethasone, Midazolam and Propofol infusion  Airway Management Planned: Nasal ETT and Video Laryngoscope Planned  Additional Equipment:   Intra-op Plan:   Post-operative Plan: Extubation in OR  Informed Consent: I have reviewed the patients History and Physical, chart, labs and discussed the procedure including the risks, benefits and alternatives for the proposed anesthesia with the patient or authorized representative who has indicated his/her understanding and acceptance.   Dental advisory  given  Plan Discussed with: CRNA  Anesthesia Plan Comments:         Anesthesia Quick Evaluation

## 2016-11-19 ENCOUNTER — Ambulatory Visit (HOSPITAL_COMMUNITY): Payer: Managed Care, Other (non HMO) | Admitting: Vascular Surgery

## 2016-11-19 ENCOUNTER — Ambulatory Visit (HOSPITAL_COMMUNITY): Payer: Managed Care, Other (non HMO) | Admitting: Anesthesiology

## 2016-11-19 ENCOUNTER — Observation Stay (HOSPITAL_COMMUNITY)
Admission: RE | Admit: 2016-11-19 | Discharge: 2016-11-20 | Disposition: A | Discharge status: Home / Self Care | Payer: Managed Care, Other (non HMO) | Source: Ambulatory Visit | Attending: Oral Surgery | Admitting: Oral Surgery

## 2016-11-19 ENCOUNTER — Encounter (HOSPITAL_COMMUNITY): Payer: Self-pay | Admitting: *Deleted

## 2016-11-19 ENCOUNTER — Encounter (HOSPITAL_COMMUNITY)
Admission: RE | Disposition: A | Discharge status: Home / Self Care | Payer: Self-pay | Source: Ambulatory Visit | Attending: Oral Surgery

## 2016-11-19 DIAGNOSIS — F319 Bipolar disorder, unspecified: Secondary | ICD-10-CM | POA: Insufficient documentation

## 2016-11-19 DIAGNOSIS — K219 Gastro-esophageal reflux disease without esophagitis: Secondary | ICD-10-CM | POA: Insufficient documentation

## 2016-11-19 DIAGNOSIS — Z91011 Allergy to milk products: Secondary | ICD-10-CM | POA: Insufficient documentation

## 2016-11-19 DIAGNOSIS — Z87442 Personal history of urinary calculi: Secondary | ICD-10-CM | POA: Insufficient documentation

## 2016-11-19 DIAGNOSIS — K0820 Unspecified atrophy of edentulous alveolar ridge: Secondary | ICD-10-CM | POA: Insufficient documentation

## 2016-11-19 DIAGNOSIS — I10 Essential (primary) hypertension: Secondary | ICD-10-CM | POA: Insufficient documentation

## 2016-11-19 DIAGNOSIS — K029 Dental caries, unspecified: Secondary | ICD-10-CM | POA: Diagnosis not present

## 2016-11-19 DIAGNOSIS — E119 Type 2 diabetes mellitus without complications: Secondary | ICD-10-CM | POA: Insufficient documentation

## 2016-11-19 DIAGNOSIS — F209 Schizophrenia, unspecified: Secondary | ICD-10-CM | POA: Insufficient documentation

## 2016-11-19 DIAGNOSIS — Z888 Allergy status to other drugs, medicaments and biological substances status: Secondary | ICD-10-CM | POA: Insufficient documentation

## 2016-11-19 DIAGNOSIS — Z79899 Other long term (current) drug therapy: Secondary | ICD-10-CM | POA: Insufficient documentation

## 2016-11-19 DIAGNOSIS — G8918 Other acute postprocedural pain: Secondary | ICD-10-CM | POA: Insufficient documentation

## 2016-11-19 DIAGNOSIS — Z7984 Long term (current) use of oral hypoglycemic drugs: Secondary | ICD-10-CM | POA: Insufficient documentation

## 2016-11-19 DIAGNOSIS — M797 Fibromyalgia: Secondary | ICD-10-CM | POA: Insufficient documentation

## 2016-11-19 DIAGNOSIS — K08439 Partial loss of teeth due to caries, unspecified class: Secondary | ICD-10-CM | POA: Insufficient documentation

## 2016-11-19 DIAGNOSIS — Z880 Allergy status to penicillin: Secondary | ICD-10-CM | POA: Insufficient documentation

## 2016-11-19 DIAGNOSIS — Z6836 Body mass index (BMI) 36.0-36.9, adult: Secondary | ICD-10-CM | POA: Insufficient documentation

## 2016-11-19 DIAGNOSIS — Z882 Allergy status to sulfonamides status: Secondary | ICD-10-CM | POA: Insufficient documentation

## 2016-11-19 DIAGNOSIS — M436 Torticollis: Secondary | ICD-10-CM | POA: Insufficient documentation

## 2016-11-19 HISTORY — PX: ORIF MANDIBULAR FRACTURE: SHX2127

## 2016-11-19 HISTORY — PX: DENTAL SURGERY: SHX609

## 2016-11-19 HISTORY — DX: Family history of other specified conditions: Z84.89

## 2016-11-19 SURGERY — OPEN REDUCTION INTERNAL FIXATION (ORIF) MANDIBULAR FRACTURE
Anesthesia: General | Site: Mouth

## 2016-11-19 MED ORDER — CLONAZEPAM 0.5 MG PO TABS
0.2500 mg | ORAL_TABLET | Freq: Every evening | ORAL | Status: DC | PRN
  Administered 2016-11-19: 0.25 mg via ORAL
  Filled 2016-11-19: qty 1

## 2016-11-19 MED ORDER — PROPOFOL 10 MG/ML IV BOLUS
INTRAVENOUS | Status: AC
  Filled 2016-11-19: qty 20

## 2016-11-19 MED ORDER — 0.9 % SODIUM CHLORIDE (POUR BTL) OPTIME
TOPICAL | Status: DC | PRN
  Administered 2016-11-19: 1000 mL

## 2016-11-19 MED ORDER — NEOSTIGMINE METHYLSULFATE 10 MG/10ML IV SOLN
INTRAVENOUS | Status: DC | PRN
  Administered 2016-11-19: 2.5 mg via INTRAVENOUS

## 2016-11-19 MED ORDER — SODIUM CHLORIDE 0.9 % IR SOLN
Status: DC | PRN
  Administered 2016-11-19: 1000 mL

## 2016-11-19 MED ORDER — DEXAMETHASONE SODIUM PHOSPHATE 10 MG/ML IJ SOLN
INTRAMUSCULAR | Status: AC
  Filled 2016-11-19: qty 1

## 2016-11-19 MED ORDER — LEVOTHYROXINE SODIUM 25 MCG PO TABS
125.0000 ug | ORAL_TABLET | Freq: Every day | ORAL | Status: DC
  Administered 2016-11-20: 125 ug via ORAL
  Filled 2016-11-19: qty 1

## 2016-11-19 MED ORDER — GLYCOPYRROLATE 0.2 MG/ML IJ SOLN
INTRAMUSCULAR | Status: DC | PRN
  Administered 2016-11-19: .5 mg via INTRAVENOUS

## 2016-11-19 MED ORDER — MELATONIN 3 MG PO TABS
9.0000 mg | ORAL_TABLET | Freq: Every day | ORAL | Status: DC
  Administered 2016-11-19: 9 mg via ORAL
  Filled 2016-11-19: qty 3

## 2016-11-19 MED ORDER — ROCURONIUM BROMIDE 100 MG/10ML IV SOLN
INTRAVENOUS | Status: DC | PRN
  Administered 2016-11-19: 40 mg via INTRAVENOUS

## 2016-11-19 MED ORDER — TRAMADOL HCL 50 MG PO TABS
50.0000 mg | ORAL_TABLET | Freq: Three times a day (TID) | ORAL | Status: DC
  Administered 2016-11-19 – 2016-11-20 (×3): 50 mg via ORAL
  Filled 2016-11-19 (×3): qty 1

## 2016-11-19 MED ORDER — LIDOCAINE-EPINEPHRINE 2 %-1:100000 IJ SOLN
INTRAMUSCULAR | Status: DC | PRN
  Administered 2016-11-19: 17 mL

## 2016-11-19 MED ORDER — ONDANSETRON HCL 4 MG PO TABS
4.0000 mg | ORAL_TABLET | Freq: Three times a day (TID) | ORAL | Status: DC | PRN
  Administered 2016-11-19: 4 mg via ORAL
  Filled 2016-11-19: qty 1

## 2016-11-19 MED ORDER — TIZANIDINE HCL 2 MG PO TABS
4.0000 mg | ORAL_TABLET | Freq: Four times a day (QID) | ORAL | Status: DC | PRN
  Administered 2016-11-19: 4 mg via ORAL
  Filled 2016-11-19: qty 2

## 2016-11-19 MED ORDER — HYDROMORPHONE HCL 1 MG/ML IJ SOLN
INTRAMUSCULAR | Status: AC
  Administered 2016-11-19: 0.5 mg
  Filled 2016-11-19: qty 1

## 2016-11-19 MED ORDER — OXYCODONE HCL 5 MG PO TABS
5.0000 mg | ORAL_TABLET | ORAL | Status: DC | PRN
  Administered 2016-11-19 – 2016-11-20 (×5): 10 mg via ORAL
  Filled 2016-11-19 (×5): qty 2

## 2016-11-19 MED ORDER — CLOZAPINE 100 MG PO TABS
175.0000 mg | ORAL_TABLET | Freq: Every day | ORAL | Status: DC
  Administered 2016-11-19: 175 mg via ORAL
  Filled 2016-11-19: qty 3

## 2016-11-19 MED ORDER — PROPOFOL 10 MG/ML IV BOLUS
INTRAVENOUS | Status: DC | PRN
  Administered 2016-11-19: 200 mg via INTRAVENOUS

## 2016-11-19 MED ORDER — DOCUSATE SODIUM 100 MG PO CAPS: 100.0000 mg | ORAL_CAPSULE | Freq: Every day | ORAL | Status: DC | PRN

## 2016-11-19 MED ORDER — ONDANSETRON HCL 4 MG/2ML IJ SOLN
4.0000 mg | Freq: Four times a day (QID) | INTRAMUSCULAR | Status: DC | PRN
  Administered 2016-11-20: 4 mg via INTRAVENOUS
  Filled 2016-11-19: qty 2

## 2016-11-19 MED ORDER — OXYMETAZOLINE HCL 0.05 % NA SOLN
NASAL | Status: AC
  Filled 2016-11-19: qty 15

## 2016-11-19 MED ORDER — OXYCODONE HCL ER 10 MG PO T12A
10.0000 mg | EXTENDED_RELEASE_TABLET | Freq: Two times a day (BID) | ORAL | Status: DC
  Administered 2016-11-19 – 2016-11-20 (×2): 10 mg via ORAL
  Filled 2016-11-19 (×2): qty 1

## 2016-11-19 MED ORDER — HYDROMORPHONE HCL 1 MG/ML IJ SOLN
0.2500 mg | INTRAMUSCULAR | Status: DC | PRN
  Administered 2016-11-19 (×2): 0.5 mg via INTRAVENOUS

## 2016-11-19 MED ORDER — LAMOTRIGINE 100 MG PO TABS
200.0000 mg | ORAL_TABLET | Freq: Every day | ORAL | Status: DC
  Administered 2016-11-19: 200 mg via ORAL
  Filled 2016-11-19: qty 2

## 2016-11-19 MED ORDER — NEOSTIGMINE METHYLSULFATE 5 MG/5ML IV SOSY
PREFILLED_SYRINGE | INTRAVENOUS | Status: AC
  Filled 2016-11-19: qty 5

## 2016-11-19 MED ORDER — MIDAZOLAM HCL 2 MG/2ML IJ SOLN
INTRAMUSCULAR | Status: AC
  Filled 2016-11-19: qty 2

## 2016-11-19 MED ORDER — VENLAFAXINE HCL ER 75 MG PO CP24
150.0000 mg | ORAL_CAPSULE | Freq: Every day | ORAL | Status: DC
  Administered 2016-11-20: 150 mg via ORAL
  Filled 2016-11-19: qty 2

## 2016-11-19 MED ORDER — FENTANYL CITRATE (PF) 100 MCG/2ML IJ SOLN
INTRAMUSCULAR | Status: DC | PRN
  Administered 2016-11-19 (×5): 50 ug via INTRAVENOUS

## 2016-11-19 MED ORDER — CLOZAPINE 100 MG PO TABS
200.0000 mg | ORAL_TABLET | Freq: Every day | ORAL | Status: DC
  Administered 2016-11-19: 200 mg via ORAL
  Filled 2016-11-19 (×2): qty 2

## 2016-11-19 MED ORDER — STERILE WATER FOR IRRIGATION IR SOLN
Status: DC | PRN
  Administered 2016-11-19: 1000 mL

## 2016-11-19 MED ORDER — ONDANSETRON HCL 4 MG/2ML IJ SOLN
INTRAMUSCULAR | Status: AC
  Filled 2016-11-19: qty 2

## 2016-11-19 MED ORDER — CLOZAPINE 25 MG PO TABS
150.0000 mg | ORAL_TABLET | Freq: Every day | ORAL | Status: DC
  Administered 2016-11-20: 150 mg via ORAL
  Filled 2016-11-19 (×2): qty 2

## 2016-11-19 MED ORDER — FENTANYL CITRATE (PF) 250 MCG/5ML IJ SOLN
INTRAMUSCULAR | Status: AC
  Filled 2016-11-19: qty 5

## 2016-11-19 MED ORDER — BUSPIRONE HCL 15 MG PO TABS: 15.0000 mg | ORAL_TABLET | Freq: Three times a day (TID) | ORAL | Status: DC

## 2016-11-19 MED ORDER — FAMOTIDINE 10 MG PO TABS
10.0000 mg | ORAL_TABLET | Freq: Every day | ORAL | Status: DC
  Administered 2016-11-19 – 2016-11-20 (×2): 10 mg via ORAL
  Filled 2016-11-19 (×2): qty 1

## 2016-11-19 MED ORDER — HYDROMORPHONE HCL 1 MG/ML IJ SOLN
0.5000 mg | INTRAMUSCULAR | Status: DC | PRN
  Administered 2016-11-19: 0.5 mg via INTRAVENOUS
  Filled 2016-11-19 (×2): qty 1

## 2016-11-19 MED ORDER — ONDANSETRON HCL 4 MG/2ML IJ SOLN
INTRAMUSCULAR | Status: AC
  Administered 2016-11-19: 4 mg
  Filled 2016-11-19: qty 2

## 2016-11-19 MED ORDER — LIDOCAINE HCL (CARDIAC) 20 MG/ML IV SOLN
INTRAVENOUS | Status: DC | PRN
  Administered 2016-11-19: 60 mg via INTRAVENOUS

## 2016-11-19 MED ORDER — HYDROMORPHONE HCL 1 MG/ML IJ SOLN
INTRAMUSCULAR | Status: AC
  Administered 2016-11-19: 0.5 mg via INTRAVENOUS
  Filled 2016-11-19: qty 1

## 2016-11-19 MED ORDER — DEXAMETHASONE SODIUM PHOSPHATE 10 MG/ML IJ SOLN
INTRAMUSCULAR | Status: DC | PRN
  Administered 2016-11-19: 10 mg via INTRAVENOUS

## 2016-11-19 MED ORDER — OLANZAPINE 5 MG PO TABS
5.0000 mg | ORAL_TABLET | Freq: Every day | ORAL | Status: DC | PRN
  Filled 2016-11-19: qty 1

## 2016-11-19 MED ORDER — ONDANSETRON HCL 4 MG PO TABS: 4.0000 mg | ORAL_TABLET | Freq: Four times a day (QID) | ORAL | Status: DC | PRN

## 2016-11-19 MED ORDER — NORETHINDRONE ACET-ETHINYL EST 1-20 MG-MCG PO TABS: 1.0000 | ORAL_TABLET | Freq: Every day | ORAL | Status: DC

## 2016-11-19 MED ORDER — LACTATED RINGERS IV SOLN
INTRAVENOUS | Status: DC | PRN
  Administered 2016-11-19 (×2): via INTRAVENOUS

## 2016-11-19 MED ORDER — METOPROLOL TARTRATE 5 MG/5ML IV SOLN: 5.0000 mg | Freq: Four times a day (QID) | INTRAVENOUS | Status: DC | PRN

## 2016-11-19 MED ORDER — CALCIUM CARBONATE 1250 (500 CA) MG PO TABS
1250.0000 mg | ORAL_TABLET | Freq: Every day | ORAL | Status: DC
  Administered 2016-11-20: 1250 mg via ORAL
  Filled 2016-11-19: qty 1

## 2016-11-19 MED ORDER — BACITRACIN ZINC 500 UNIT/GM EX OINT
TOPICAL_OINTMENT | CUTANEOUS | Status: AC
  Filled 2016-11-19: qty 28.35

## 2016-11-19 MED ORDER — PHENYLEPHRINE 40 MCG/ML (10ML) SYRINGE FOR IV PUSH (FOR BLOOD PRESSURE SUPPORT)
PREFILLED_SYRINGE | INTRAVENOUS | Status: AC
  Filled 2016-11-19: qty 10

## 2016-11-19 MED ORDER — LABETALOL HCL 100 MG PO TABS
100.0000 mg | ORAL_TABLET | Freq: Three times a day (TID) | ORAL | Status: DC
  Administered 2016-11-19 – 2016-11-20 (×3): 100 mg via ORAL
  Filled 2016-11-19 (×4): qty 1

## 2016-11-19 MED ORDER — LIDOCAINE-EPINEPHRINE 2 %-1:100000 IJ SOLN
INTRAMUSCULAR | Status: AC
  Filled 2016-11-19: qty 1

## 2016-11-19 MED ORDER — MIDAZOLAM HCL 5 MG/5ML IJ SOLN
INTRAMUSCULAR | Status: DC | PRN
  Administered 2016-11-19: 2 mg via INTRAVENOUS

## 2016-11-19 MED ORDER — METFORMIN HCL ER 500 MG PO TB24
500.0000 mg | ORAL_TABLET | Freq: Three times a day (TID) | ORAL | Status: DC
  Administered 2016-11-19 – 2016-11-20 (×4): 500 mg via ORAL
  Filled 2016-11-19 (×5): qty 1

## 2016-11-19 MED ORDER — OXYCODONE ER 9 MG PO C12A: 1.0000 | EXTENDED_RELEASE_CAPSULE | Freq: Two times a day (BID) | ORAL | Status: DC

## 2016-11-19 MED ORDER — PHENYLEPHRINE 40 MCG/ML (10ML) SYRINGE FOR IV PUSH (FOR BLOOD PRESSURE SUPPORT)
PREFILLED_SYRINGE | INTRAVENOUS | Status: DC | PRN
  Administered 2016-11-19 (×3): 80 ug via INTRAVENOUS

## 2016-11-19 MED ORDER — DEXTROSE-NACL 5-0.45 % IV SOLN
INTRAVENOUS | Status: DC
  Administered 2016-11-19: 13:00:00 via INTRAVENOUS

## 2016-11-19 MED ORDER — POLYETHYLENE GLYCOL 3350 17 G PO PACK
17.0000 g | PACK | Freq: Every day | ORAL | Status: DC
  Administered 2016-11-20: 17 g via ORAL
  Filled 2016-11-19: qty 1

## 2016-11-19 MED ORDER — CALCITRIOL 0.5 MCG PO CAPS
0.5000 ug | ORAL_CAPSULE | Freq: Every day | ORAL | Status: DC
  Administered 2016-11-19 – 2016-11-20 (×2): 0.5 ug via ORAL
  Filled 2016-11-19 (×2): qty 1

## 2016-11-19 MED ORDER — ONDANSETRON HCL 4 MG/2ML IJ SOLN
INTRAMUSCULAR | Status: DC | PRN
Start: 2016-11-19 — End: 2016-11-19
  Administered 2016-11-19: 4 mg via INTRAVENOUS

## 2016-11-19 MED ORDER — HYDROMORPHONE HCL 1 MG/ML IJ SOLN
1.0000 mg | INTRAMUSCULAR | Status: DC | PRN
  Administered 2016-11-19 – 2016-11-20 (×6): 1 mg via INTRAVENOUS
  Filled 2016-11-19 (×6): qty 1

## 2016-11-19 SURGICAL SUPPLY — 58 items
APL SKNCLS STERI-STRIP NONHPOA (GAUZE/BANDAGES/DRESSINGS)
BENZOIN TINCTURE PRP APPL 2/3 (GAUZE/BANDAGES/DRESSINGS) ×1 IMPLANT
BLADE 10 SAFETY STRL DISP (BLADE) ×3 IMPLANT
BLADE SURG 15 STRL LF DISP TIS (BLADE) ×1 IMPLANT
BLADE SURG 15 STRL SS (BLADE) ×3
BUR CROSS CUT (BURR)
BUR EGG ELITE 4.0 (BURR) ×1 IMPLANT
BUR EGG ELITE 4.0MM (BURR) ×1
BUR SRG MED 1.6XXCUT FSSR (BURR) IMPLANT
BUR SRG MED 2.1XXCUT FSSR (BURR) IMPLANT
BURR SRG MED 1.6XXCUT FSSR (BURR)
BURR SRG MED 2.1XXCUT FSSR (BURR)
CANISTER SUCT 3000ML PPV (MISCELLANEOUS) ×3 IMPLANT
CLOSURE WOUND 1/2 X4 (GAUZE/BANDAGES/DRESSINGS)
COVER SURGICAL LIGHT HANDLE (MISCELLANEOUS) ×3 IMPLANT
Cover Screw - Conical Connection NP ×16 IMPLANT
DECANTER SPIKE VIAL GLASS SM (MISCELLANEOUS) ×3 IMPLANT
ELECT COATED BLADE 2.86 ST (ELECTRODE) IMPLANT
ELECT NDL BLADE 2-5/6 (NEEDLE) IMPLANT
ELECT NEEDLE BLADE 2-5/6 (NEEDLE) IMPLANT
ELECT REM PT RETURN 9FT ADLT (ELECTROSURGICAL) ×3
ELECTRODE REM PT RTRN 9FT ADLT (ELECTROSURGICAL) ×1 IMPLANT
GAUZE PACKING FOLDED 2  STR (GAUZE/BANDAGES/DRESSINGS) ×2
GAUZE PACKING FOLDED 2 STR (GAUZE/BANDAGES/DRESSINGS) ×1 IMPLANT
GAUZE SPONGE 4X4 16PLY XRAY LF (GAUZE/BANDAGES/DRESSINGS) ×3 IMPLANT
GLOVE BIO SURGEON STRL SZ 6.5 (GLOVE) ×2 IMPLANT
GLOVE BIO SURGEON STRL SZ7.5 (GLOVE) ×6 IMPLANT
GLOVE BIO SURGEONS STRL SZ 6.5 (GLOVE) ×1
GOWN STRL REUS W/ TWL LRG LVL3 (GOWN DISPOSABLE) ×2 IMPLANT
GOWN STRL REUS W/ TWL XL LVL3 (GOWN DISPOSABLE) ×1 IMPLANT
GOWN STRL REUS W/TWL LRG LVL3 (GOWN DISPOSABLE) ×6
GOWN STRL REUS W/TWL XL LVL3 (GOWN DISPOSABLE) ×3
Healing Abutment - Conical Connection NP IMPLANT
KIT ROOM TURNOVER OR (KITS) ×3 IMPLANT
KIT SPLINT NASAL DENVER LRG BE (GAUZE/BANDAGES/DRESSINGS) IMPLANT
NDL BLUNT 16X1.5 OR ONLY (NEEDLE) ×2 IMPLANT
NEEDLE 22X1 1/2 (OR ONLY) (NEEDLE) ×5 IMPLANT
NEEDLE BLUNT 16X1.5 OR ONLY (NEEDLE) IMPLANT
NS IRRIG 1000ML POUR BTL (IV SOLUTION) ×3 IMPLANT
NobelActive - Internal NP (Dental) ×4 IMPLANT
NobelActive Internal NP (Dental) ×12 IMPLANT
PAD ARMBOARD 7.5X6 YLW CONV (MISCELLANEOUS) ×6 IMPLANT
PATTIES SURGICAL .5 X3 (DISPOSABLE) ×3 IMPLANT
PENCIL BUTTON HOLSTER BLD 10FT (ELECTRODE) IMPLANT
SCISSORS WIRE ANG 4 3/4 DISP (INSTRUMENTS) ×3 IMPLANT
STRIP CLOSURE SKIN 1/2X4 (GAUZE/BANDAGES/DRESSINGS) ×1 IMPLANT
SUT CHROMIC 3 0 PS 2 (SUTURE) ×2 IMPLANT
SUT ETHILON 5 0 P 3 18 (SUTURE)
SUT MNCRL AB 4-0 PS2 18 (SUTURE) ×4 IMPLANT
SUT NYLON ETHILON 5-0 P-3 1X18 (SUTURE) IMPLANT
SUT STEEL 0 (SUTURE)
SUT STEEL 0 18XMFL TIE 17 (SUTURE) IMPLANT
SUT STEEL 2 (SUTURE) IMPLANT
SYR 50ML SLIP (SYRINGE) ×2 IMPLANT
TRAY ENT MC OR (CUSTOM PROCEDURE TRAY) ×3 IMPLANT
TUBING IRRIGATION (MISCELLANEOUS) ×2 IMPLANT
WATER STERILE IRR 1000ML POUR (IV SOLUTION) ×3 IMPLANT
YANKAUER SUCT BULB TIP NO VENT (SUCTIONS) ×3 IMPLANT

## 2016-11-19 NOTE — H&P (Signed)
H&P documentation  -History and Physical Reviewed  -Patient has been re-examined  -No change in the plan of care  Racquel Arkin M  

## 2016-11-19 NOTE — Op Note (Signed)
11/19/2016  9:46 AM  PATIENT:  Ashlee Day  31 y.o. female  PRE-OPERATIVE DIAGNOSIS:  NON RESTORABLE TEETH 7, 8, 9, ATROPHY PARTIALLY EDENTULOUS MAXILLA AND EDENTULOUS MANDIBLE POST-OPERATIVE DIAGNOSIS:  SAME  PROCEDURE:  Procedure(s): Extraction (teeth numbers seven, eight, nine); Alveoloplasty; Placement maxillary and mandibular implants  SURGEON:  Surgeon(s): Diona Browner, DDS  ANESTHESIA:   local and general  EBL:100CC  DRAINS: none   SPECIMEN:  No Specimen  COUNTS:  YES  PLAN OF CARE: Discharge to home after PACU  PATIENT DISPOSITION:  PACU - hemodynamically stable.   PROCEDURE DETAILS: Dictation # [id number]  Gae Bon, DMD 11/19/2016 9:46 AM

## 2016-11-19 NOTE — Transfer of Care (Signed)
Immediate Anesthesia Transfer of Care Note  Patient: Ashlee Day  Procedure(s) Performed: Procedure(s): Extraction (teeth numbers seven, eight, nine); Alveoloplasty; Placement maxillary and mandibular implants (N/A)  Patient Location: PACU  Anesthesia Type:General  Level of Consciousness: awake, alert  and oriented  Airway & Oxygen Therapy: Patient Spontanous Breathing and Patient connected to nasal cannula oxygen  Post-op Assessment: Report given to RN, Post -op Vital signs reviewed and stable and Patient moving all extremities X 4  Post vital signs: Reviewed and stable  Last Vitals:  Vitals:   11/19/16 0547  BP: 124/87  Pulse: 97  Resp: 18  Temp: 36.6 C  SpO2: 100%    Last Pain: There were no vitals filed for this visit.       Complications: No apparent anesthesia complications

## 2016-11-19 NOTE — Anesthesia Procedure Notes (Signed)
Procedure Name: Intubation Date/Time: 11/19/2016 7:33 AM Performed by: Kyung Rudd Pre-anesthesia Checklist: Patient identified, Emergency Drugs available, Suction available and Patient being monitored Patient Re-evaluated:Patient Re-evaluated prior to induction Oxygen Delivery Method: Circle system utilized Preoxygenation: Pre-oxygenation with 100% oxygen Induction Type: IV induction Ventilation: Mask ventilation without difficulty Laryngoscope Size: Glidescope and 4 Nasal Tubes: Right Tube size: 7.0 mm Number of attempts: 1 Airway Equipment and Method: Video-laryngoscopy (Grade I view on screen.) Placement Confirmation: ETT inserted through vocal cords under direct vision,  positive ETCO2 and breath sounds checked- equal and bilateral Tube secured with: Tape Dental Injury: Teeth and Oropharynx as per pre-operative assessment

## 2016-11-19 NOTE — Op Note (Signed)
NAME:  Ashlee Day, BUN NO.:  [id number]  MEDICAL RECORD NO.:  [id number]  LOCATION:                                 FACILITY:  PHYSICIAN:  Gae Bon, M.D.       DATE OF BIRTH:  DATE OF PROCEDURE:  11/19/2016 DATE OF DISCHARGE:                              OPERATIVE REPORT   PREOPERATIVE DIAGNOSES:  Nonrestorable teeth numbers 7, 8, 9 secondary to dental caries, atrophy of partially edentulous maxilla, and fully edentulous mandible.  PROCEDURE:  Extraction of teeth numbers 7, 8, 9; alveoplasty, anterior maxilla; placement of maxillary and mandibular implants with custom fabricated surgical guides.  SURGEON:  Gae Bon, M.D.  ANESTHESIA:  General nasal intubation, official attending.  DESCRIPTION OF PROCEDURE:  The patient was taken to the operating room, placed on the table in supine position.  General anesthesia was administered intravenously and a nasal endotracheal tube was placed and secured.  The eyes were protected and then the patient was draped for the procedure.  Time-out was performed.  The posterior pharynx was suctioned and a throat pack was placed.  A 2% lidocaine with 1:100,000 epinephrine was infiltrated in inferior alveolar block on the right and left side and buccal and palatal infiltration in the maxilla and mandible.  Then, a total of 17 mL was utilized.  Then, a 15 blade was used to make an incision around teeth numbers 7, 8, 9.  The teeth were elevated with a 301 elevator and removed from the mouth with a dental forceps.  The sockets were curetted and then a crestal incision was made in the maxilla in the area of the first molar on the left side, carried forward across the midline to the first molar area on the right side. Vertical releasing incisions were made in the posterior aspects of the incisions.  Then, the periosteum was reflected to expose the alveolar bone.  The dissection was carried palatally as well and then the  palatal tissue was retracted with the retraction sutures 3-0 chromic.  Then, a prefabricated surgical splint was positioned into the mouth and found to have good fit without interferences.  Then, using a 1.5 mm drill bit, the splint was temporarily anchored to the maxilla using anchor pins and a mallet.  Then, different sized drills were used to create increasingly large size holes in the area of the teeth numbers 4, 6, 11, and 13 and then, a tap was used for the final drilling and then four 3.5 mm implants, 10 mm length were placed into the previously drilled drill holes.  Then, the maxillary splint was removed and the implants were tightened using a torque wrench taking care to avoid any torque over 70 N-cm.  Then, cover screws were placed on the implants.  The anterior maxilla was then smoothed using rongeurs and a bone file.  Bone collected during the alveoplasty was placed on the palatal aspect of the maxillary implants to provide additional bone support.  Then, the maxilla was sutured with 4-0 cytoplast sutures.  Then, attention was turned to the mandible.  A 15 blade was used to make a crestal incision beginning in the left  first molar area, carrying forward across the midline to the right first molar area.  Then, vertical releases were made bilaterally posteriorly.  The periosteum was reflected both buccally and lingually to expose the alveolar bone.  The custom surgical splint was placed on the mandible and had a good fit without interferences.  The splint was attached to the mandible using the anchor pins and a mallet, and then using a similar fashion as was used in the maxilla, varying increasing size drill diameters were used to create osteotomy sites for the implants in the area of teeth numbers 20, 22, 27, 29.  The osteotomy sites were then tapped and then the splint was removed and the implants were placed.  Posterior implants were 3.5 x 10 and anterior implants were 3.5 x  13.  Then, a torque wrench was used to assess final seating and then, the areas were sutured after irrigation with 4-0 cytoplast.  Then, the oral cavity was inspected, irrigated, and suctioned.  A throat pack was removed.  The patient was left in care of anesthesia for awakening and transportation to recovery room with plans to stay in 23-hour observation with discharge the next day.  ESTIMATED BLOOD LOSS:  100 mL.  SPECIMENS:  None.  COMPLICATIONS:  None.     Gae Bon, M.D.   ______________________________ Gae Bon, M.D.    SMJ/MEDQ  D:  11/19/2016  T:  11/19/2016  Job:  [id number]

## 2016-11-19 NOTE — Anesthesia Postprocedure Evaluation (Signed)
Anesthesia Post Note  Patient: Ashlee Day  Procedure(s) Performed: Procedure(s) (LRB): Extraction (teeth numbers seven, eight, nine); Alveoloplasty; Placement maxillary and mandibular implants (N/A)     Patient location during evaluation: PACU Anesthesia Type: General Level of consciousness: awake and alert Pain management: pain level controlled Vital Signs Assessment: post-procedure vital signs reviewed and stable Respiratory status: spontaneous breathing, nonlabored ventilation and respiratory function stable Cardiovascular status: blood pressure returned to baseline and stable Postop Assessment: no signs of nausea or vomiting Anesthetic complications: no    Last Vitals:  Vitals:   11/19/16 1035 11/19/16 1037  BP: 119/74   Pulse: 92 93  Resp: 18 19  Temp:    SpO2: 94% 95%    Last Pain:  Vitals:   11/19/16 1035  PainSc: 10-Worst pain ever                 Rayna Brenner,W. EDMOND

## 2016-11-20 DIAGNOSIS — K029 Dental caries, unspecified: Secondary | ICD-10-CM | POA: Diagnosis not present

## 2016-11-20 MED ORDER — OXYCODONE HCL 5 MG PO TABS: 10.0000 mg | ORAL_TABLET | ORAL | 0 refills | Status: DC | PRN

## 2016-11-20 MED ORDER — OXYCODONE HCL 5 MG PO TABS: 5.0000 mg | ORAL_TABLET | ORAL | 0 refills | Status: DC | PRN

## 2016-11-20 MED ORDER — METRONIDAZOLE 250 MG PO TABS: 250.0000 mg | ORAL_TABLET | Freq: Three times a day (TID) | ORAL | 0 refills | Status: AC

## 2016-11-20 NOTE — Discharge Summary (Signed)
Patient ID: Ashlee Day MRN: [id number] DOB/AGE: 1985/10/04 31 y.o.  Admit date: 11/19/2016 Discharge date: 11/20/2016  Admission Diagnoses: S/p Placement maxillary and mandibular implants, removal 3 teeth, post-operative pain  Discharge Diagnoses:  Active Problems:   Post-op pain   Discharged Condition: good    Hospital Course: Admitted postoperatively for pain management. Received IV dilaudid.  Consults: None  Significant Diagnostic Studies: none  Treatments: IV hydration and analgesia: Dilaudid  Discharge Exam: Blood pressure 107/90, pulse 96, temperature 99 F (37.2 C), temperature source Axillary, resp. rate 16, height 5\' 3"  (1.6 m), weight 207 lb (93.9 kg), last menstrual period 10/27/2016, SpO2 96 %. General appearance: alert, cooperative and no distress Head: Normocephalic, without obvious abnormality, atraumatic Eyes: negative Nose: Nares normal. Septum midline. Mucosa normal. No drainage or sinus tenderness. Throat: sutures intact. no bleeding. pharynx clear. moderate soft bilateral maxillary and mandibular edema Neck: no adenopathy and mild soft submandibular edema bilateral Resp: clear to auscultation bilaterally Cardio: regular rate and rhythm, S1, S2 normal, no murmur, click, rub or gallop  Disposition: 01-Home or Self Care  Discharge Instructions    Call MD for:  difficulty breathing, headache or visual disturbances    Complete by:  As directed    Call MD for:  persistant nausea and vomiting    Complete by:  As directed    Call MD for:  severe uncontrolled pain    Complete by:  As directed    Call MD for:  temperature >100.4    Complete by:  As directed    Diet - low sodium heart healthy    Complete by:  As directed    Soft Diet. Advance as tolerated.   Discharge instructions    Complete by:  As directed    Warm salt water mouth rinses 4-5 times per day starting the day after surgery. Ice to affected area for 2-3 days. Soft diet, advance as  tolerated. Follow-up visit with Dr. Hoyt Koch as scheduled. Call [phone] for problems.   Increase activity slowly    Complete by:  As directed      Allergies as of 11/20/2016      Reactions   Amoxicillin Other (See Comments)   Blisters all over Cannot take any 'cillins' at all   Demerol [meperidine] Nausea And Vomiting   Can not tolerate Iv administration Can tolerate IM adminstration   Imitrex [sumatriptan]    Severity unknown   Other Itching   Pink NG tape   Penicillins Other (See Comments)   Cannot not tolerate any cillins due to amoxicillin reaction   Sulfa Antibiotics Nausea And Vomiting   Depacon [valproic Acid] Nausea And Vomiting, Rash   Depakote [divalproex Sodium] Nausea And Vomiting, Rash   Dihydroergotamine Nausea And Vomiting, Rash   Metoclopramide Nausea And Vomiting, Rash   React to IV form Can tolerate oral medication   Morphine And Related Rash      Medication List    STOP taking these medications   XTAMPZA ER 9 MG C12a Generic drug:  OxyCODONE ER     TAKE these medications   CALCI-CHEW 1250 (500 Ca) MG chewable tablet Generic drug:  calcium carbonate Chew 1 tablet by mouth daily.   calcitRIOL 0.5 MCG capsule Commonly known as:  ROCALTROL Take 0.5 mcg by mouth daily.   cholecalciferol 1000 units tablet Commonly known as:  VITAMIN D Take 1 tablet (1,000 Units total) by mouth daily. For nutritional supplement.   clonazePAM 0.5 MG tablet Commonly known as:  Bobbye [place]  Take 0.25 mg by mouth at bedtime as needed for anxiety.   cloZAPine 100 MG tablet Commonly known as:  CLOZARIL Take 150-200 mg by mouth See admin instructions. 150 mg in the morning. 200 mg at 3:00 pm. 175 mg at bedtime   docusate sodium 100 MG capsule Commonly known as:  COLACE Take 100 mg by mouth daily as needed for moderate constipation.   labetalol 100 MG tablet Commonly known as:  NORMODYNE Take 1 tablet by mouth 3 (three) times daily.   lamoTRIgine 200 MG tablet Commonly  known as:  LAMICTAL Take 1 tablet (200 mg total) by mouth at bedtime. For mood lability.   levothyroxine 125 MCG tablet Commonly known as:  SYNTHROID, LEVOTHROID Take 125 mcg by mouth daily before breakfast.   medroxyPROGESTERone 10 MG tablet Commonly known as:  PROVERA Take 10 mg by mouth daily.   Melatonin 3 MG Tabs Take 9 mg by mouth at bedtime.   metFORMIN 500 MG 24 hr tablet Commonly known as:  GLUCOPHAGE-XR Take 500 mg by mouth 4 (four) times daily.   metroNIDAZOLE 250 MG tablet Commonly known as:  FLAGYL Take 1 tablet (250 mg total) by mouth 3 (three) times daily.   OLANZapine 2.5 MG tablet Commonly known as:  ZYPREXA Take 2.5-10 mg by mouth daily as needed.   ondansetron 4 MG tablet Commonly known as:  ZOFRAN Take 4 mg by mouth every 8 (eight) hours as needed for nausea or vomiting.   oxyCODONE 5 MG immediate release tablet Commonly known as:  Oxy IR/ROXICODONE Take 2 tablets (10 mg total) by mouth every 4 (four) hours as needed.   polyethylene glycol packet Commonly known as:  MIRALAX / GLYCOLAX Take 17 g by mouth daily.   ranitidine 150 MG tablet Commonly known as:  ZANTAC Take 150 mg by mouth 2 (two) times daily.   tiZANidine 4 MG tablet Commonly known as:  ZANAFLEX Take 4 mg by mouth every 6 (six) hours as needed (neck pain).   traMADol 50 MG tablet Commonly known as:  ULTRAM Take 50 mg by mouth 3 (three) times daily.   venlafaxine XR 150 MG 24 hr capsule Commonly known as:  EFFEXOR-XR Take 150 mg by mouth daily with breakfast.   vitamin B-12 1000 MCG tablet Commonly known as:  CYANOCOBALAMIN Take 1,000 mcg by mouth daily.        SignedGae Bon 11/20/2016, 10:11 AM

## 2016-11-23 ENCOUNTER — Encounter (HOSPITAL_COMMUNITY): Payer: Self-pay | Admitting: Oral Surgery

## 2016-12-07 ENCOUNTER — Emergency Department (HOSPITAL_BASED_OUTPATIENT_CLINIC_OR_DEPARTMENT_OTHER)
Admission: EM | Admit: 2016-12-07 | Discharge: 2016-12-07 | Disposition: A | Discharge status: Home / Self Care | Payer: Managed Care, Other (non HMO) | Attending: Emergency Medicine | Admitting: Emergency Medicine

## 2016-12-07 ENCOUNTER — Emergency Department (HOSPITAL_BASED_OUTPATIENT_CLINIC_OR_DEPARTMENT_OTHER): Payer: Managed Care, Other (non HMO)

## 2016-12-07 ENCOUNTER — Encounter (HOSPITAL_BASED_OUTPATIENT_CLINIC_OR_DEPARTMENT_OTHER): Payer: Self-pay

## 2016-12-07 DIAGNOSIS — Y9301 Activity, walking, marching and hiking: Secondary | ICD-10-CM | POA: Diagnosis not present

## 2016-12-07 DIAGNOSIS — M25571 Pain in right ankle and joints of right foot: Secondary | ICD-10-CM

## 2016-12-07 DIAGNOSIS — Z79899 Other long term (current) drug therapy: Secondary | ICD-10-CM | POA: Insufficient documentation

## 2016-12-07 DIAGNOSIS — Y999 Unspecified external cause status: Secondary | ICD-10-CM | POA: Diagnosis not present

## 2016-12-07 DIAGNOSIS — S8254XA Nondisplaced fracture of medial malleolus of right tibia, initial encounter for closed fracture: Secondary | ICD-10-CM | POA: Diagnosis not present

## 2016-12-07 DIAGNOSIS — S82391A Other fracture of lower end of right tibia, initial encounter for closed fracture: Secondary | ICD-10-CM

## 2016-12-07 DIAGNOSIS — Y929 Unspecified place or not applicable: Secondary | ICD-10-CM | POA: Diagnosis not present

## 2016-12-07 DIAGNOSIS — E039 Hypothyroidism, unspecified: Secondary | ICD-10-CM | POA: Insufficient documentation

## 2016-12-07 DIAGNOSIS — Z85850 Personal history of malignant neoplasm of thyroid: Secondary | ICD-10-CM | POA: Diagnosis not present

## 2016-12-07 DIAGNOSIS — W0110XA Fall on same level from slipping, tripping and stumbling with subsequent striking against unspecified object, initial encounter: Secondary | ICD-10-CM | POA: Insufficient documentation

## 2016-12-07 DIAGNOSIS — Z7984 Long term (current) use of oral hypoglycemic drugs: Secondary | ICD-10-CM | POA: Insufficient documentation

## 2016-12-07 DIAGNOSIS — S99911A Unspecified injury of right ankle, initial encounter: Secondary | ICD-10-CM | POA: Diagnosis present

## 2016-12-07 MED ORDER — OXYCODONE-ACETAMINOPHEN 5-325 MG PO TABS
1.0000 | ORAL_TABLET | Freq: Once | ORAL | Status: AC
  Administered 2016-12-07: 1 via ORAL
  Filled 2016-12-07: qty 1

## 2016-12-07 MED ORDER — OXYCODONE-ACETAMINOPHEN 5-325 MG PO TABS: 1.0000 | ORAL_TABLET | Freq: Three times a day (TID) | ORAL | 0 refills | Status: DC | PRN

## 2016-12-07 NOTE — ED Provider Notes (Signed)
[facility] Provider Note   CSN: [id number] Arrival date & time: 12/07/16  1625     History   Chief Complaint Chief Complaint  Patient presents with  . Ankle Injury    HPI Ashlee Day is a 31 y.o. female who presents to the emergency department with a chief complaint of right foot and ankle pain that began one week ago after a fall. She reports that she recently had a dental implant placed and was feeling weak after the surgery when she felt weak and "passed out" while walking to the bathroom. The patient's mother reports that when she found her that the patient's right foot and ankle was "bent back" under the patient's body "at a weird angle". She denies hitting her head, nausea, or emesis.  She reports that she has not sought evaluation until now because the patient has been staying upstairs in the home since her dental procedure, and has been unable to bear weight on the leg. She reports they recently barred a rolling walker, which allowed her to get around over the last week and a half. She reports that she has been able to bear some weight, such as when she is standing in the shower, but the pain has been very severe. She denies previous or surgery or injury to the right lower extremity. She reports that she has been elevating the extremity and applying ice, but the swelling has not gone down. She has been treating her pain with Toradol and oxycodone from her dental surgery, which has controlled her pain. She denies ongoing weakness or falls and has no other complaints at this time.  The history is provided by the patient. No language interpreter was used.    Past Medical History:  Diagnosis Date  . Bipolar disorder ([place])   . Cancer [facility])    THYROID CANCER  . Chronic pain disorder   . Depression   . Dysrhythmia    SURGES TACHYCARDIA    . Family history of adverse reaction to anesthesia    mother also has nausea   . Fibromyalgia   . GERD (gastroesophageal reflux  disease)   . Hashimoto's thyroiditis    2005ish  . History of kidney stones    2013+2017  . Hyperparathyroidism ([place])   . Hyperparathyroidism, unspecified ([place])   . Hypothyroidism   . Ileus ([place])    x3-4  . 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 Migraines    occasional aura  . Partial edentulism   . Pituitary adenoma ([place])   . Polycystic disease, ovaries   . PONV (postoperative nausea and vomiting)   . Psychosis    ORGANIC PSYCHOSIS    . Torticollis     Patient Active Problem List   Diagnosis Date Noted  . Post-op pain 11/19/2016  . Borderline personality disorder 06/11/2012    Class: Chronic  . Dependent personality disorder 06/11/2012    Class: Chronic  . Drug-seeking behavior 06/08/2012  . Polypharmacy 05/25/2012  . Major depressive disorder, recurrent episode, severe, specified as with psychotic behavior 05/23/2012  . Unspecified episodic mood disorder 02/23/2012  . Constipation 12/16/2011  . Fibromyalgia   . Depression   . Ileus ([place])   . Hashimoto's thyroiditis   . Hyperparathyroidism, unspecified ([place])   . Kidney stone   . Bipolar disorder   . GERD (gastroesophageal reflux disease)   . Migraines   . Partial edentulism     Past Surgical History:  Procedure Laterality Date  .  appendicitis     2003  . DENTAL SURGERY     MULT  . DENTAL SURGERY  11/19/2016  . DILATION AND CURETTAGE OF UTERUS         1999    TOTAL 3  . LAPAROSCOPY    . ORIF MANDIBULAR FRACTURE N/A 11/19/2016   Procedure: Extraction (teeth numbers seven, eight, nine); Alveoloplasty; Placement maxillary and mandibular implants;  Surgeon: Diona Browner, DDS;  Location: [facility];  Service: Oral Surgery;  Laterality: N/A;  . PARATHYROIDECTOMY      OB History    No data available       Home Medications    Prior to Admission medications   Medication Sig Start Date End Date Taking? Authorizing Provider  calcitRIOL (ROCALTROL) 0.5 MCG capsule Take 0.5 mcg by mouth daily.    [provider]  calcium carbonate (CALCI-CHEW) 1250 (500 Ca) MG chewable tablet Chew 1 tablet by mouth daily.    [provider]  cholecalciferol (VITAMIN D) 1000 UNITS tablet Take 1 tablet (1,000 Units total) by mouth daily. For nutritional supplement. 06/16/12   Ruben Im, PA-C  clonazePAM (KLONOPIN) 0.5 MG tablet Take 0.25 mg by mouth at bedtime as needed for anxiety.    [provider]  cloZAPine (CLOZARIL) 100 MG tablet Take 150-200 mg by mouth See admin instructions. 150 mg in the morning. 200 mg at 3:00 pm. 175 mg at bedtime    [provider]  docusate sodium (COLACE) 100 MG capsule Take 100 mg by mouth daily as needed for moderate constipation.     [provider]  labetalol (NORMODYNE) 100 MG tablet Take 1 tablet by mouth 3 (three) times daily. 09/07/16   [provider]  lamoTRIgine (LAMICTAL) 200 MG tablet Take 1 tablet (200 mg total) by mouth at bedtime. For mood lability. 07/26/12   Patrick Jupiter, PA-C  levothyroxine (SYNTHROID, LEVOTHROID) 125 MCG tablet Take 125 mcg by mouth daily before breakfast.     [provider]  medroxyPROGESTERone (PROVERA) 10 MG tablet Take 10 mg by mouth daily.    [provider]  Melatonin 3 MG TABS Take 9 mg by mouth at bedtime.    [provider]  metFORMIN (GLUCOPHAGE-XR) 500 MG 24 hr tablet Take 500 mg by mouth 4 (four) times daily.    [provider]  OLANZapine (ZYPREXA) 2.5 MG tablet Take 2.5-10 mg by mouth daily as needed.    [provider]  ondansetron (ZOFRAN) 4 MG tablet Take 4 mg by mouth every 8 (eight) hours as needed for nausea or vomiting.    [provider]  oxyCODONE (OXY IR/ROXICODONE) 5 MG immediate release tablet Take 1 tablet (5 mg total) by mouth every 4 (four) hours as needed. 11/20/16   Diona Browner, DDS  oxyCODONE-acetaminophen (PERCOCET/ROXICET) 5-325 MG tablet Take 1 tablet by mouth every 8 (eight) hours as needed for severe pain.  12/07/16   Sidhant Helderman A, PA-C  polyethylene glycol (MIRALAX / GLYCOLAX) packet Take 17 g by mouth daily.    [provider]  ranitidine (ZANTAC) 150 MG tablet Take 150 mg by mouth 2 (two) times daily.    [provider]  tiZANidine (ZANAFLEX) 4 MG tablet Take 4 mg by mouth every 6 (six) hours as needed (neck pain).    [provider]  traMADol (ULTRAM) 50 MG tablet Take 50 mg by mouth 3 (three) times daily.    [provider]  venlafaxine XR (EFFEXOR-XR) 150 MG  24 hr capsule Take 150 mg by mouth daily with breakfast.    [provider]  vitamin B-12 (CYANOCOBALAMIN) 1000 MCG tablet Take 1,000 mcg by mouth daily.    [provider]    Family History Family History  Problem Relation Age of Onset  . Hypertension Father   . Coronary artery disease Paternal Grandfather        smoker  . Diabetes Paternal Grandfather        Medication induced  . Cancer Maternal Grandfather        MM  . Hypertension Maternal Grandmother   . Migraines Mother   . Rheum arthritis Mother        undifferentiated connective tissue    Social History Social History  Substance Use Topics  . Smoking status: Never Smoker  . Smokeless tobacco: Never Used  . Alcohol use No     Allergies   Amoxicillin; Demerol [meperidine]; Imitrex [sumatriptan]; Other; Penicillins; Sulfa antibiotics; Depacon [valproic acid]; Depakote [divalproex sodium]; Dihydroergotamine; Metoclopramide; and Morphine and related   Review of Systems Review of Systems  Musculoskeletal: Positive for arthralgias, gait problem, joint swelling and myalgias.  Neurological: Negative for weakness, numbness and headaches.     Physical Exam Updated Vital Signs BP 125/90 (BP Location: Left Arm)   Pulse (!) 108   Temp 98 F (36.7 C) (Axillary) Comment (Src): ax per pt request  Resp 16   Ht 5\' 3"  (1.6 m)   Wt 93.9 kg (207 lb)   LMP  (LMP Unknown)   SpO2 99%   BMI 36.67 kg/m   Physical  Exam  Constitutional: No distress.  HENT:  Head: Normocephalic.  Eyes: Conjunctivae are normal.  Neck: Neck supple.  Cardiovascular: Normal rate and regular rhythm.  Exam reveals no gallop and no friction rub.   No murmur heard. Pulmonary/Chest: Effort normal. No respiratory distress.  Abdominal: Soft. She exhibits no distension.  Musculoskeletal: She exhibits edema.  Tender to palpation over the lateral malleolus and distal tibia of the right ankle.and decreased range of motion secondary to pain. Sensation is intact throughout the right foot. DP and PT pulses are 2+ and symmetric.   Neurological: She is alert.  Skin: Skin is warm. No rash noted.  Psychiatric: Her behavior is normal.  Nursing note and vitals reviewed.  ED Treatments / Results  Labs (all labs ordered are listed, but only abnormal results are displayed) Labs Reviewed - No data to display  EKG  EKG Interpretation None       Radiology Dg Ankle Complete Right  Result Date: 12/07/2016 CLINICAL DATA:  Initial evaluation for recent trauma, fall 2 weeks ago. EXAM: RIGHT ANKLE - COMPLETE 3+ VIEW COMPARISON:  None. FINDINGS: On lateral projection, there is a linear lucency extending through the posterior tibia/posterior malleolus, suspicious for acute nondisplaced fracture. This is not well seen on AP or oblique views. No other acute fracture. Ankle mortise remains approximated. Mild diffuse soft tissue swelling present about the ankle. IMPRESSION: 1. Linear lucency extending through the posterior tibia/ posterior malleolus, suspicious for acute nondisplaced fracture. 2. Diffuse soft tissue swelling about the ankle. Electronically Signed   By: Jeannine Boga M.D.   On: 12/07/2016 17:22   Dg Foot Complete Right  Result Date: 12/07/2016 CLINICAL DATA:  Initial evaluation for recent injury, fall 2 weeks ago. EXAM: RIGHT FOOT COMPLETE - 3+ VIEW COMPARISON:  None available. FINDINGS: No acute fracture dislocation about the  foot. Linear lucency traversing the posterior tibia again noted, better  evaluated on concomitant ankle radiographs. Joint spaces maintained without evidence for significant degenerative or erosive arthropathy. No appreciable soft tissue injury. IMPRESSION: No acute osseous abnormality identified about the right foot. Electronically Signed   By: Jeannine Boga M.D.   On: 12/07/2016 17:25    Procedures Procedures (including critical care time)  Medications Ordered in ED Medications  oxyCODONE-acetaminophen (PERCOCET/ROXICET) 5-325 MG per tablet 1 tablet (1 tablet Oral Given 12/07/16 2014)     Initial Impression / Assessment and Plan / ED Course  I have reviewed the triage vital signs and the nursing notes.  Pertinent labs & imaging results that were available during my care of the patient were reviewed by me and considered in my medical decision making (see chart for details).     31 year old female presenting with right ankle pain that began a week and half ago after the patient had an episode of generalized weakness and fell. X-ray demonstrating a linear lucency extending to the posterior tibia/posterior malleolus, suspicious for an acute nondisplaced fracture. Placed the patient in a posterior splint with stirrup. Crutches were given in the emergency department. Will have the patient follow up with Dr. Percell Miller with ortho as an outpatient. Strict return precautions given. Pain managed in the emergency department. Discussed that patient's initial injury occurred 1-1/2 weeks ago and I'm concerned about writing for more than a very, very short course of pain medication given that the patient has also been on narcotics for the last few weeks for recent dental implant. Discussed the risk of increased addiction along the patient is taking opioid medications. Encouraged anti-inflammatories, rest, and elevation. Strict return precautions given. No acute distress. The patient is safe for discharge at  this time.   Final Clinical Impressions(s) / ED Diagnoses   Final diagnoses:  Acute right ankle pain  Closed fracture of posterior malleolus of right tibia, initial encounter    New Prescriptions Discharge Medication List as of 12/07/2016  8:52 PM    START taking these medications   Details  oxyCODONE-acetaminophen (PERCOCET/ROXICET) 5-325 MG tablet Take 1 tablet by mouth every 8 (eight) hours as needed for severe pain., Starting Tue 12/07/2016, Print         Shameeka Silliman A, PA-C 12/08/16 0341    Tegeler, Gwenyth Allegra, MD 12/08/16 [phone]

## 2016-12-07 NOTE — Discharge Instructions (Signed)
Please keep your right ankle splinted until you can follow-up with Dr. Percell Miller. Please call his office tomorrow morning to schedule a follow-up appointment within the next 3-4 days. Please keep the splint clean and dry.  If you have Toradol at home you can use this for pain control. If not, you can take 800 mg of ibuprofen with food every 8 hours as needed for pain control. You can apply ice or heat for 15-20 minutes up to 3-4 times a day. Please elevate the ankle above the level of the heart when you are sitting and resting. You have been given a very short course of Percocet. Please only take this medication for very severe pain. This medication is a narcotic and can be addicting. Please do not drive or work while taking this medication.

## 2016-12-07 NOTE — ED Notes (Signed)
ED Provider at bedside. 

## 2016-12-07 NOTE — ED Triage Notes (Signed)
Fell injured right ankle/foot x 1-2 weeks ago-presents to triage in w/c-NAD

## 2016-12-14 ENCOUNTER — Encounter (HOSPITAL_COMMUNITY): Payer: Self-pay | Admitting: *Deleted

## 2016-12-14 NOTE — Progress Notes (Signed)
Pt SDW-pre-op call completed by pt mother Lenell Antu, with pt verbal consent. Mother denies that pt C/O SOB and chest pain. Pt under the care of Dr. Gerarda Gunther, Cardiology. Mother denies that pt had a stress test and cardiac cath. Mother denies that pt had recent labs. Mother made aware to have pt stop taking Aspirin, vitamins, fish oil, Melatonin and herbal medications. Do not take any NSAIDs ie: Ibuprofen, Advil, Naproxen (Aleve), Motrin, BC and Goody Powder or any medication containing Aspirin. Mother verbalized understanding of all pre-op instructions.

## 2016-12-15 ENCOUNTER — Observation Stay (HOSPITAL_COMMUNITY)
Admission: RE | Admit: 2016-12-15 | Discharge: 2016-12-16 | Disposition: A | Discharge status: Home / Self Care | Payer: Managed Care, Other (non HMO) | Source: Ambulatory Visit | Attending: Orthopaedic Surgery | Admitting: Orthopaedic Surgery

## 2016-12-15 ENCOUNTER — Ambulatory Visit (HOSPITAL_COMMUNITY): Payer: Managed Care, Other (non HMO)

## 2016-12-15 ENCOUNTER — Encounter (HOSPITAL_COMMUNITY)
Admission: RE | Disposition: A | Discharge status: Home / Self Care | Payer: Self-pay | Source: Ambulatory Visit | Attending: Orthopaedic Surgery

## 2016-12-15 ENCOUNTER — Ambulatory Visit (HOSPITAL_COMMUNITY): Payer: Managed Care, Other (non HMO) | Admitting: Anesthesiology

## 2016-12-15 ENCOUNTER — Encounter (HOSPITAL_COMMUNITY): Payer: Self-pay | Admitting: *Deleted

## 2016-12-15 DIAGNOSIS — M797 Fibromyalgia: Secondary | ICD-10-CM | POA: Diagnosis not present

## 2016-12-15 DIAGNOSIS — Z7984 Long term (current) use of oral hypoglycemic drugs: Secondary | ICD-10-CM | POA: Diagnosis not present

## 2016-12-15 DIAGNOSIS — G8929 Other chronic pain: Secondary | ICD-10-CM | POA: Insufficient documentation

## 2016-12-15 DIAGNOSIS — E039 Hypothyroidism, unspecified: Secondary | ICD-10-CM | POA: Insufficient documentation

## 2016-12-15 DIAGNOSIS — Z882 Allergy status to sulfonamides status: Secondary | ICD-10-CM | POA: Insufficient documentation

## 2016-12-15 DIAGNOSIS — E213 Hyperparathyroidism, unspecified: Secondary | ICD-10-CM | POA: Diagnosis not present

## 2016-12-15 DIAGNOSIS — S82891A Other fracture of right lower leg, initial encounter for closed fracture: Secondary | ICD-10-CM | POA: Diagnosis present

## 2016-12-15 DIAGNOSIS — K219 Gastro-esophageal reflux disease without esophagitis: Secondary | ICD-10-CM | POA: Diagnosis not present

## 2016-12-15 DIAGNOSIS — F319 Bipolar disorder, unspecified: Secondary | ICD-10-CM | POA: Diagnosis not present

## 2016-12-15 DIAGNOSIS — Z885 Allergy status to narcotic agent status: Secondary | ICD-10-CM | POA: Diagnosis not present

## 2016-12-15 DIAGNOSIS — Z85850 Personal history of malignant neoplasm of thyroid: Secondary | ICD-10-CM | POA: Insufficient documentation

## 2016-12-15 DIAGNOSIS — E282 Polycystic ovarian syndrome: Secondary | ICD-10-CM | POA: Diagnosis not present

## 2016-12-15 DIAGNOSIS — Z419 Encounter for procedure for purposes other than remedying health state, unspecified: Secondary | ICD-10-CM

## 2016-12-15 DIAGNOSIS — S82399A Other fracture of lower end of unspecified tibia, initial encounter for closed fracture: Secondary | ICD-10-CM | POA: Diagnosis present

## 2016-12-15 DIAGNOSIS — X58XXXA Exposure to other specified factors, initial encounter: Secondary | ICD-10-CM | POA: Insufficient documentation

## 2016-12-15 DIAGNOSIS — Z880 Allergy status to penicillin: Secondary | ICD-10-CM | POA: Insufficient documentation

## 2016-12-15 HISTORY — PX: SYNDESMOSIS REPAIR: SHX5182

## 2016-12-15 HISTORY — PX: ORIF ANKLE FRACTURE: SHX5408

## 2016-12-15 HISTORY — DX: Other fracture of unspecified lower leg, initial encounter for closed fracture: S82.899A

## 2016-12-15 HISTORY — PX: ORIF ANKLE FRACTURE: SUR919

## 2016-12-15 LAB — BASIC METABOLIC PANEL
ANION GAP: 7 (ref 5–15)
BUN: 9 mg/dL (ref 6–20)
CALCIUM: 8.8 mg/dL — AB (ref 8.9–10.3)
CO2: 23 mmol/L (ref 22–32)
Chloride: 106 mmol/L (ref 101–111)
Creatinine, Ser: 0.91 mg/dL (ref 0.44–1.00)
GLUCOSE: 82 mg/dL (ref 65–99)
Potassium: 3.8 mmol/L (ref 3.5–5.1)
SODIUM: 136 mmol/L (ref 135–145)

## 2016-12-15 LAB — CBC
HEMATOCRIT: 37.8 % (ref 36.0–46.0)
Hemoglobin: 11.5 g/dL — ABNORMAL LOW (ref 12.0–15.0)
MCH: 24.6 pg — ABNORMAL LOW (ref 26.0–34.0)
MCHC: 30.4 g/dL (ref 30.0–36.0)
MCV: 80.9 fL (ref 78.0–100.0)
PLATELETS: 251 10*3/uL (ref 150–400)
RBC: 4.67 MIL/uL (ref 3.87–5.11)
RDW: 15.1 % (ref 11.5–15.5)
WBC: 6.7 10*3/uL (ref 4.0–10.5)

## 2016-12-15 LAB — HCG, SERUM, QUALITATIVE: Preg, Serum: NEGATIVE

## 2016-12-15 SURGERY — OPEN REDUCTION INTERNAL FIXATION (ORIF) ANKLE FRACTURE
Anesthesia: General | Laterality: Right

## 2016-12-15 MED ORDER — LACTATED RINGERS IV SOLN
INTRAVENOUS | Status: DC | PRN
  Administered 2016-12-15: 13:00:00 via INTRAVENOUS

## 2016-12-15 MED ORDER — DIPHENHYDRAMINE HCL 50 MG/ML IJ SOLN
INTRAMUSCULAR | Status: DC | PRN
  Administered 2016-12-15: 10 mg via INTRAVENOUS

## 2016-12-15 MED ORDER — CLINDAMYCIN PHOSPHATE 900 MG/50ML IV SOLN
900.0000 mg | INTRAVENOUS | Status: AC
Start: 2016-12-15 — End: 2016-12-15
  Administered 2016-12-15: 900 mg via INTRAVENOUS
  Filled 2016-12-15: qty 50

## 2016-12-15 MED ORDER — METHOCARBAMOL 1000 MG/10ML IJ SOLN: 500.0000 mg | Freq: Four times a day (QID) | INTRAVENOUS | Status: DC | PRN

## 2016-12-15 MED ORDER — FENTANYL CITRATE (PF) 100 MCG/2ML IJ SOLN
INTRAMUSCULAR | Status: DC | PRN
  Administered 2016-12-15 (×2): 100 ug via INTRAVENOUS

## 2016-12-15 MED ORDER — FENTANYL CITRATE (PF) 100 MCG/2ML IJ SOLN
INTRAMUSCULAR | Status: DC
Start: 2016-12-15 — End: 2016-12-15
  Filled 2016-12-15: qty 2

## 2016-12-15 MED ORDER — MIDAZOLAM HCL 5 MG/5ML IJ SOLN
INTRAMUSCULAR | Status: DC | PRN
  Administered 2016-12-15: 2 mg via INTRAVENOUS

## 2016-12-15 MED ORDER — ACETAMINOPHEN 325 MG PO TABS: 650.0000 mg | ORAL_TABLET | Freq: Four times a day (QID) | ORAL | Status: DC | PRN

## 2016-12-15 MED ORDER — OXYCODONE HCL 5 MG/5ML PO SOLN: 5.0000 mg | Freq: Once | ORAL | Status: DC | PRN

## 2016-12-15 MED ORDER — LIDOCAINE 2% (20 MG/ML) 5 ML SYRINGE
INTRAMUSCULAR | Status: DC | PRN
  Administered 2016-12-15: 80 mg via INTRAVENOUS

## 2016-12-15 MED ORDER — LEVOTHYROXINE SODIUM 112 MCG PO TABS
112.0000 ug | ORAL_TABLET | Freq: Every day | ORAL | Status: DC
  Administered 2016-12-16: 112 ug via ORAL
  Filled 2016-12-15: qty 1

## 2016-12-15 MED ORDER — 0.9 % SODIUM CHLORIDE (POUR BTL) OPTIME
TOPICAL | Status: DC | PRN
  Administered 2016-12-15: 1000 mL

## 2016-12-15 MED ORDER — METHOCARBAMOL 500 MG PO TABS
500.0000 mg | ORAL_TABLET | Freq: Four times a day (QID) | ORAL | Status: DC | PRN
  Administered 2016-12-15 – 2016-12-16 (×2): 500 mg via ORAL
  Filled 2016-12-15 (×2): qty 1

## 2016-12-15 MED ORDER — CLONAZEPAM 0.5 MG PO TABS
0.2500 mg | ORAL_TABLET | Freq: Every day | ORAL | Status: DC
  Administered 2016-12-15: 0.25 mg via ORAL
  Filled 2016-12-15: qty 1

## 2016-12-15 MED ORDER — BISACODYL 10 MG RE SUPP: 10.0000 mg | Freq: Every day | RECTAL | Status: DC | PRN

## 2016-12-15 MED ORDER — ONDANSETRON HCL 4 MG PO TABS: 4.0000 mg | ORAL_TABLET | Freq: Four times a day (QID) | ORAL | Status: DC | PRN

## 2016-12-15 MED ORDER — MIDAZOLAM HCL 2 MG/2ML IJ SOLN
INTRAMUSCULAR | Status: AC
  Filled 2016-12-15: qty 2

## 2016-12-15 MED ORDER — HYDROMORPHONE HCL 1 MG/ML IJ SOLN
0.2500 mg | INTRAMUSCULAR | Status: DC | PRN
  Administered 2016-12-15 (×4): 0.5 mg via INTRAVENOUS

## 2016-12-15 MED ORDER — CLOZAPINE 25 MG PO TABS
175.0000 mg | ORAL_TABLET | Freq: Every day | ORAL | Status: DC
  Administered 2016-12-15: 22:00:00 175 mg via ORAL
  Filled 2016-12-15: qty 3

## 2016-12-15 MED ORDER — CHLORHEXIDINE GLUCONATE 4 % EX LIQD: 60.0000 mL | Freq: Once | CUTANEOUS | Status: DC

## 2016-12-15 MED ORDER — HYDROMORPHONE HCL 1 MG/ML IJ SOLN
INTRAMUSCULAR | Status: AC
  Filled 2016-12-15: qty 1

## 2016-12-15 MED ORDER — DEXAMETHASONE SODIUM PHOSPHATE 10 MG/ML IJ SOLN
INTRAMUSCULAR | Status: AC
  Filled 2016-12-15: qty 1

## 2016-12-15 MED ORDER — DEXAMETHASONE SODIUM PHOSPHATE 10 MG/ML IJ SOLN
INTRAMUSCULAR | Status: DC | PRN
  Administered 2016-12-15: 10 mg via INTRAVENOUS

## 2016-12-15 MED ORDER — VENLAFAXINE HCL ER 150 MG PO CP24
150.0000 mg | ORAL_CAPSULE | Freq: Every day | ORAL | Status: DC
  Administered 2016-12-16: 150 mg via ORAL
  Filled 2016-12-15: qty 1

## 2016-12-15 MED ORDER — FENTANYL CITRATE (PF) 250 MCG/5ML IJ SOLN
INTRAMUSCULAR | Status: AC
  Filled 2016-12-15: qty 5

## 2016-12-15 MED ORDER — CLOZAPINE 100 MG PO TABS
200.0000 mg | ORAL_TABLET | Freq: Every day | ORAL | Status: DC
  Administered 2016-12-15: 200 mg via ORAL
  Filled 2016-12-15 (×2): qty 2

## 2016-12-15 MED ORDER — POLYETHYLENE GLYCOL 3350 17 G PO PACK: 17.0000 g | PACK | Freq: Every day | ORAL | Status: DC | PRN

## 2016-12-15 MED ORDER — OLANZAPINE 5 MG PO TABS: 5.0000 mg | ORAL_TABLET | Freq: Every day | ORAL | Status: DC | PRN

## 2016-12-15 MED ORDER — BUPIVACAINE-EPINEPHRINE (PF) 0.5% -1:200000 IJ SOLN
INTRAMUSCULAR | Status: DC | PRN
  Administered 2016-12-15: 25 mL via PERINEURAL

## 2016-12-15 MED ORDER — ONDANSETRON HCL 4 MG/2ML IJ SOLN
INTRAMUSCULAR | Status: AC
  Filled 2016-12-15: qty 2

## 2016-12-15 MED ORDER — LACTATED RINGERS IV SOLN
Freq: Once | INTRAVENOUS | Status: AC
  Administered 2016-12-15: 11:00:00 via INTRAVENOUS

## 2016-12-15 MED ORDER — OXYCODONE HCL 5 MG PO TABS: 5.0000 mg | ORAL_TABLET | Freq: Once | ORAL | Status: DC | PRN

## 2016-12-15 MED ORDER — HYDROMORPHONE HCL 1 MG/ML IJ SOLN
1.0000 mg | INTRAMUSCULAR | Status: DC | PRN
  Administered 2016-12-15 – 2016-12-16 (×3): 1 mg via INTRAVENOUS
  Filled 2016-12-15 (×4): qty 1

## 2016-12-15 MED ORDER — DOCUSATE SODIUM 100 MG PO CAPS
100.0000 mg | ORAL_CAPSULE | Freq: Two times a day (BID) | ORAL | Status: DC
  Administered 2016-12-15 – 2016-12-16 (×2): 100 mg via ORAL
  Filled 2016-12-15 (×2): qty 1

## 2016-12-15 MED ORDER — PROPOFOL 10 MG/ML IV BOLUS
INTRAVENOUS | Status: DC | PRN
  Administered 2016-12-15: 160 mg via INTRAVENOUS

## 2016-12-15 MED ORDER — ACETAMINOPHEN 650 MG RE SUPP: 650.0000 mg | Freq: Four times a day (QID) | RECTAL | Status: DC | PRN

## 2016-12-15 MED ORDER — ONDANSETRON HCL 4 MG/2ML IJ SOLN: 4.0000 mg | Freq: Four times a day (QID) | INTRAMUSCULAR | Status: DC | PRN

## 2016-12-15 MED ORDER — METFORMIN HCL ER 500 MG PO TB24
2000.0000 mg | ORAL_TABLET | Freq: Every day | ORAL | Status: DC
  Administered 2016-12-15: 2000 mg via ORAL
  Filled 2016-12-15: qty 4

## 2016-12-15 MED ORDER — LABETALOL HCL 100 MG PO TABS
100.0000 mg | ORAL_TABLET | Freq: Three times a day (TID) | ORAL | Status: DC
  Administered 2016-12-15 – 2016-12-16 (×2): 100 mg via ORAL
  Filled 2016-12-15 (×4): qty 1

## 2016-12-15 MED ORDER — CLINDAMYCIN PHOSPHATE 600 MG/50ML IV SOLN
600.0000 mg | Freq: Four times a day (QID) | INTRAVENOUS | Status: AC
  Administered 2016-12-15 – 2016-12-16 (×3): 600 mg via INTRAVENOUS
  Filled 2016-12-15 (×3): qty 50

## 2016-12-15 MED ORDER — METOCLOPRAMIDE HCL 5 MG PO TABS: 5.0000 mg | ORAL_TABLET | Freq: Three times a day (TID) | ORAL | Status: DC | PRN

## 2016-12-15 MED ORDER — CLOZAPINE 25 MG PO TABS
150.0000 mg | ORAL_TABLET | Freq: Every day | ORAL | Status: DC
  Administered 2016-12-16: 08:00:00 150 mg via ORAL
  Filled 2016-12-15: qty 2

## 2016-12-15 MED ORDER — FAMOTIDINE 20 MG PO TABS
20.0000 mg | ORAL_TABLET | Freq: Every day | ORAL | Status: DC
  Administered 2016-12-16: 20 mg via ORAL
  Filled 2016-12-15 (×2): qty 1

## 2016-12-15 MED ORDER — KETOROLAC TROMETHAMINE 15 MG/ML IJ SOLN
7.5000 mg | Freq: Four times a day (QID) | INTRAMUSCULAR | Status: AC
  Administered 2016-12-15 – 2016-12-16 (×4): 7.5 mg via INTRAVENOUS
  Filled 2016-12-15 (×4): qty 1

## 2016-12-15 MED ORDER — OXYCODONE HCL 5 MG PO TABS
10.0000 mg | ORAL_TABLET | ORAL | Status: DC | PRN
  Administered 2016-12-15 – 2016-12-16 (×3): 10 mg via ORAL
  Filled 2016-12-15 (×3): qty 2

## 2016-12-15 MED ORDER — OXYCODONE HCL ER 10 MG PO T12A
10.0000 mg | EXTENDED_RELEASE_TABLET | Freq: Two times a day (BID) | ORAL | Status: DC
  Administered 2016-12-15 – 2016-12-16 (×2): 10 mg via ORAL
  Filled 2016-12-15 (×2): qty 1

## 2016-12-15 MED ORDER — METOCLOPRAMIDE HCL 5 MG/ML IJ SOLN: 5.0000 mg | Freq: Three times a day (TID) | INTRAMUSCULAR | Status: DC | PRN

## 2016-12-15 MED ORDER — LAMOTRIGINE 100 MG PO TABS
200.0000 mg | ORAL_TABLET | Freq: Every day | ORAL | Status: DC
  Administered 2016-12-15: 200 mg via ORAL
  Filled 2016-12-15: qty 2

## 2016-12-15 MED ORDER — SUCCINYLCHOLINE CHLORIDE 200 MG/10ML IV SOSY
PREFILLED_SYRINGE | INTRAVENOUS | Status: DC | PRN
  Administered 2016-12-15: 100 mg via INTRAVENOUS

## 2016-12-15 MED ORDER — DIPHENHYDRAMINE HCL 12.5 MG/5ML PO ELIX: 12.5000 mg | ORAL_SOLUTION | ORAL | Status: DC | PRN

## 2016-12-15 MED ORDER — DIPHENHYDRAMINE HCL 50 MG/ML IJ SOLN
INTRAMUSCULAR | Status: AC
  Filled 2016-12-15: qty 1

## 2016-12-15 SURGICAL SUPPLY — 63 items
BANDAGE ACE 4X5 VEL STRL LF (GAUZE/BANDAGES/DRESSINGS) ×2 IMPLANT
BANDAGE ACE 6X5 VEL STRL LF (GAUZE/BANDAGES/DRESSINGS) IMPLANT
BANDAGE ESMARK 6X9 LF (GAUZE/BANDAGES/DRESSINGS) IMPLANT
BIT DRILL 2.7XCANN QCK CNCT (BIT) IMPLANT
BIT DRILL CANN 2.7 (BIT) ×2
BIT DRILL CANN 2.7MM (BIT) ×1
BIT DRL 2.7XCANN QCK CNCT (BIT) ×1
BNDG CMPR 9X6 STRL LF SNTH (GAUZE/BANDAGES/DRESSINGS)
BNDG ESMARK 6X9 LF (GAUZE/BANDAGES/DRESSINGS)
CLOSURE STERI-STRIP 1/2X4 (GAUZE/BANDAGES/DRESSINGS) ×1
CLSR STERI-STRIP ANTIMIC 1/2X4 (GAUZE/BANDAGES/DRESSINGS) ×1 IMPLANT
COVER SURGICAL LIGHT HANDLE (MISCELLANEOUS) ×3 IMPLANT
CUFF TOURNIQUET SINGLE 34IN LL (TOURNIQUET CUFF) IMPLANT
CUFF TOURNIQUET SINGLE 44IN (TOURNIQUET CUFF) IMPLANT
DRAPE C-ARM 42X72 X-RAY (DRAPES) ×3 IMPLANT
DRAPE U-SHAPE 47X51 STRL (DRAPES) ×3 IMPLANT
DURAPREP 26ML APPLICATOR (WOUND CARE) ×3 IMPLANT
ELECT REM PT RETURN 9FT ADLT (ELECTROSURGICAL) ×3
ELECTRODE REM PT RTRN 9FT ADLT (ELECTROSURGICAL) ×1 IMPLANT
GAUZE SPONGE 4X4 12PLY STRL (GAUZE/BANDAGES/DRESSINGS) ×3 IMPLANT
GAUZE SPONGE 4X4 12PLY STRL LF (GAUZE/BANDAGES/DRESSINGS) ×2 IMPLANT
GAUZE XEROFORM 5X9 LF (GAUZE/BANDAGES/DRESSINGS) ×3 IMPLANT
GLOVE BIO SURGEON STRL SZ8 (GLOVE) ×3 IMPLANT
GLOVE BIOGEL PI IND STRL 8 (GLOVE) ×2 IMPLANT
GLOVE BIOGEL PI INDICATOR 8 (GLOVE) ×4
GLOVE ORTHO TXT STRL SZ7.5 (GLOVE) ×3 IMPLANT
GOWN STRL REUS W/ TWL LRG LVL3 (GOWN DISPOSABLE) ×2 IMPLANT
GOWN STRL REUS W/ TWL XL LVL3 (GOWN DISPOSABLE) ×4 IMPLANT
GOWN STRL REUS W/TWL LRG LVL3 (GOWN DISPOSABLE) ×6
GOWN STRL REUS W/TWL XL LVL3 (GOWN DISPOSABLE) ×12
K-WIRE ACE 1.6X6 (WIRE) ×6
KIT BASIN OR (CUSTOM PROCEDURE TRAY) ×3 IMPLANT
KIT ROOM TURNOVER OR (KITS) ×3 IMPLANT
KWIRE ACE 1.6X6 (WIRE) IMPLANT
MANIFOLD NEPTUNE II (INSTRUMENTS) ×3 IMPLANT
NDL HYPO 25GX1X1/2 BEV (NEEDLE) IMPLANT
NEEDLE HYPO 25GX1X1/2 BEV (NEEDLE) IMPLANT
NS IRRIG 1000ML POUR BTL (IV SOLUTION) ×3 IMPLANT
PACK ORTHO EXTREMITY (CUSTOM PROCEDURE TRAY) ×3 IMPLANT
PAD ABD 8X10 STRL (GAUZE/BANDAGES/DRESSINGS) ×2 IMPLANT
PAD ARMBOARD 7.5X6 YLW CONV (MISCELLANEOUS) ×6 IMPLANT
PAD CAST 4YDX4 CTTN HI CHSV (CAST SUPPLIES) ×2 IMPLANT
PADDING CAST COTTON 4X4 STRL (CAST SUPPLIES) ×3
PADDING CAST COTTON 6X4 STRL (CAST SUPPLIES) ×3 IMPLANT
PREFILTER NEPTUNE (MISCELLANEOUS) ×3 IMPLANT
SCREW CANN .5 THRD RVRS CUT (Screw) IMPLANT
SCREW CANN PT 4.0X48 (Screw) ×2 IMPLANT
SCREW CANN THRD 4X42 108 (Screw) ×2 IMPLANT
SCREW CANNULATED 4.0X44MM (Screw) ×3 IMPLANT
SPONGE LAP 4X18 X RAY DECT (DISPOSABLE) ×6 IMPLANT
SUCTION FRAZIER HANDLE 10FR (MISCELLANEOUS) ×2
SUCTION TUBE FRAZIER 10FR DISP (MISCELLANEOUS) ×1 IMPLANT
SUT ETHILON 2 0 FS 18 (SUTURE) ×3 IMPLANT
SUT VIC AB 0 CT1 27 (SUTURE) ×3
SUT VIC AB 0 CT1 27XBRD ANBCTR (SUTURE) ×1 IMPLANT
SUT VIC AB 2-0 CT1 27 (SUTURE) ×3
SUT VIC AB 2-0 CT1 TAPERPNT 27 (SUTURE) ×1 IMPLANT
SYR CONTROL 10ML LL (SYRINGE) IMPLANT
TOWEL OR 17X24 6PK STRL BLUE (TOWEL DISPOSABLE) ×3 IMPLANT
TOWEL OR 17X26 10 PK STRL BLUE (TOWEL DISPOSABLE) ×3 IMPLANT
TUBE CONNECTING 12'X1/4 (SUCTIONS) ×1
TUBE CONNECTING 12X1/4 (SUCTIONS) ×2 IMPLANT
WATER STERILE IRR 1000ML POUR (IV SOLUTION) ×3 IMPLANT

## 2016-12-15 NOTE — Anesthesia Procedure Notes (Addendum)
Anesthesia Regional Block: Popliteal block   Pre-Anesthetic Checklist: ,, timeout performed, Correct Patient, Correct Site, Correct Procedure, Correct Position, site marked, risks and benefits discussed, surgical consent, pre-op evaluation,  At surgeon's request and post-op pain management  Laterality: Right and Lower  Prep: chloraprep       Needles:   Needle Type: Echogenic Stimulator Needle     Needle Length: 9cm  Needle Gauge: 21   Needle insertion depth: 6 cm   Additional Needles:   Procedures: ultrasound guided,,,,,,,,  Narrative:  Start time: 12/15/2016 1:00 PM End time: 12/15/2016 1:15 PM Injection made incrementally with aspirations every 5 mL.  Performed by: Personally  Anesthesiologist: Adyn Serna

## 2016-12-15 NOTE — Transfer of Care (Signed)
Immediate Anesthesia Transfer of Care Note  Patient: Ashlee Day  Procedure(s) Performed: Procedure(s): OPEN REDUCTION INTERNAL FIXATION (ORIF) ANKLE FRACTURE (Right) SYNDESMOSIS REPAIR (Right)  Patient Location: PACU  Anesthesia Type:GA combined with regional for post-op pain  Level of Consciousness: awake, oriented and patient cooperative  Airway & Oxygen Therapy: Patient Spontanous Breathing and Patient connected to nasal cannula oxygen  Post-op Assessment: Report given to RN, Post -op Vital signs reviewed and stable and Patient moving all extremities  Post vital signs: Reviewed and stable  Last Vitals:  Vitals:   12/15/16 1001  BP: 139/85  Pulse: (!) 103  Resp: 16  Temp: 36.9 C  SpO2: 100%    Last Pain:  Vitals:   12/15/16 1049  TempSrc:   PainSc: 8       Patients Stated Pain Goal: 3 (63/84/66 5993)  Complications: No apparent anesthesia complications

## 2016-12-15 NOTE — Anesthesia Preprocedure Evaluation (Addendum)
Anesthesia Evaluation  Patient identified by MRN, date of birth, ID band Patient awake    Reviewed: Allergy & Precautions, NPO status , Patient's Chart, lab work & pertinent test results  History of Anesthesia Complications (+) PONV  Airway Mallampati: III  TM Distance: <3 FB Neck ROM: Full    Dental no notable dental hx.    Pulmonary    breath sounds clear to auscultation       Cardiovascular + dysrhythmias  Rhythm:Regular Rate:Normal     Neuro/Psych  Headaches, Bipolar Disorder Schizophrenia    GI/Hepatic GERD  ,  Endo/Other  Hypothyroidism   Renal/GU Renal disease     Musculoskeletal  (+) Fibromyalgia -  Abdominal   Peds  Hematology   Anesthesia Other Findings   Reproductive/Obstetrics                            Anesthesia Physical Anesthesia Plan  ASA: III  Anesthesia Plan: General   Post-op Pain Management:    Induction: Intravenous  PONV Risk Score and Plan: 4 or greater and Ondansetron, Dexamethasone, Midazolam, Scopolamine patch - Pre-op and Treatment may vary due to age or medical condition  Airway Management Planned: Oral ETT  Additional Equipment:   Intra-op Plan:   Post-operative Plan: Extubation in OR  Informed Consent: I have reviewed the patients History and Physical, chart, labs and discussed the procedure including the risks, benefits and alternatives for the proposed anesthesia with the patient or authorized representative who has indicated his/her understanding and acceptance.   Dental advisory given  Plan Discussed with: CRNA  Anesthesia Plan Comments:         Anesthesia Quick Evaluation

## 2016-12-15 NOTE — Anesthesia Procedure Notes (Signed)
Procedure Name: Intubation Date/Time: 12/15/2016 1:09 PM Performed by: Melina Copa, Zorana Brockwell R Pre-anesthesia Checklist: Patient identified, Emergency Drugs available, Suction available and Patient being monitored Patient Re-evaluated:Patient Re-evaluated prior to induction Oxygen Delivery Method: Circle System Utilized Preoxygenation: Pre-oxygenation with 100% oxygen Induction Type: IV induction Ventilation: Mask ventilation without difficulty Laryngoscope Size: Mac and 3 Grade View: Grade I Tube type: Oral Tube size: 7.5 mm Number of attempts: 1 Airway Equipment and Method: Stylet Placement Confirmation: ETT inserted through vocal cords under direct vision,  positive ETCO2 and breath sounds checked- equal and bilateral Secured at: 21 cm Tube secured with: Tape Dental Injury: Teeth and Oropharynx as per pre-operative assessment

## 2016-12-15 NOTE — Op Note (Signed)
Orthopaedic Surgery Operative Note (CSN: [id number])  Ashlee Day  [birth year] Date of Surgery: 12/15/2016 Admit Date: 12/15/2016  Diagnoses:  Right ankle fracture, posterior malleolus  Post-Op Diagnosis: Same  Procedure:    OPEN REDUCTION INTERNAL FIXATION (ORIF) ANKLE FRACTURE CPT(R) Code:  26712- ORIF ANKLE FRACTURE   Operative Finding Successful completion of planned procedure. Syndesmosis after posterior malleolus fixation was stable thus precluding the need for syndesmosis fixation.  Post-operative plan: The patient will be admitted for 23 hrs obs due to medical comorbidites.  The patient will be TDWB in splint till clinic.  DVT prophylaxis with aspirin 81mg  qd.  Pain control with PRN pain medication preferring oral medicines.  Follow up plan will be scheduled in approximately 10-14 days for wound check.  Surgeons:Primary: Hiram Gash, MD Location: [facility] OR ROOM 06 Anesthesia: Regional+General Antibiotics: Clindamycin Tourniquet time:  Total Tourniquet Time Documented: Thigh (Right) - 20 minutes Total: Thigh (Right) - 20 minutes Estimated Blood Loss: minimal Complications: None Specimens: None Implants:  Implant Name Type Inv. Item Serial No. Manufacturer Lot No. LRB No. Used Action  4.0 cannulated x 48  1/3 thread    Zimmer  Right 1 Implanted  4.0 cannulated x 42  1/3 thread    Zimmer  Right 1 Implanted  4.0 cannulated x 44    1/2 thread       Zimmer   Right 1 Implanted    Indications for Surgery:   Ashlee Day is a 31 y.o. female with history of 10 days prior a fall resulting in ankle pain and demonstrated fracture.  There was concern for syndesmosis injury and mortise asymmetry in this young patient.  We recommended consideratio for surgery due to this.  Benefits and risks of operative and nonoperative management were discussed prior to surgery with patient/guardian(s) and informed consent form was completed.     Procedure:   The patient was identified in the  preoperative holding area where the surgical site was marked. The patient was taken to the OR where a procedural timeout was called and the above noted anesthesia was induced.  Preoperative antibiotics were dosed.  The patient's right ankle was prepped and draped in the usual sterile fashion.  A second preoperative timeout was called.      A tourniquet was used for the above listed time.  We used fluoroscopy to assess the fracture fragment and closed reduced it with a dorsiflexion maneuver.  We then identified the TA and worked medial to this.  We used fluoro to guide our approach and made a small 2 cm incision longitudinally and spread down bluntly exposing the tibia.  At that point we placed two guidewires for the 4.0 cannulated screws obtaining purchase in the far fragment while reduced.  We then measured and drilled the near cortex before placing the screws obtaining good bicortical purchase with each.    The ankle was then stressed and the syndesmosis was found to be stable without mortise widening after fixation.  We decided additional fixation was not necessary due to this.  The wound was thoroughly irrigated.  The wound was closed in a multilayer fashion with absorbable suture.  A sterile dressing was placed along with a splint.  The patient was awoken from general anesthesia and taken to the PACU in stable condition without complication.    I was scrubbed and present for the entire case.  Joya Gaskins, OPA-C, present and scrubbed throughout the case, critical for completion in a timely fashion, and for  retraction, instrumentation, closure and splinting.

## 2016-12-15 NOTE — H&P (Signed)
PREOPERATIVE H&P  Chief Complaint: right ankle fracture  HPI: Ashlee Day is a 31 y.o. female who presents for preoperative history and physical with a diagnosis of right ankle fracture. Symptoms are rated as moderate to severe, and have been worsening.  This is significantly impairing activities of daily living.  She has elected for surgical management.   Past Medical History:  Diagnosis Date  . Ankle fracture    right  . Bipolar disorder ([place])   . Cancer [facility])    THYROID CANCER  . Chronic pain disorder   . Depression   . Dysrhythmia    SURGES TACHYCARDIA    . Family history of adverse reaction to anesthesia    mother also has nausea   . Fibromyalgia   . GERD (gastroesophageal reflux disease)   . Hashimoto's thyroiditis    2005ish  . History of kidney stones    2013+2017  . Hyperparathyroidism ([place])   . Hyperparathyroidism, unspecified ([place])   . Hypothyroidism   . Ileus ([place])    x3-4  . 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 Migraines    occasional aura  . Partial edentulism   . Pituitary adenoma ([place])   . Polycystic disease, ovaries   . PONV (postoperative nausea and vomiting)   . Psychosis    ORGANIC PSYCHOSIS    . Torticollis    Past Surgical History:  Procedure Laterality Date  . appendicitis     2003  . DENTAL SURGERY     MULT  . DENTAL SURGERY  11/19/2016  . DILATION AND CURETTAGE OF UTERUS         1999    TOTAL 3  . LAPAROSCOPY    . ORIF MANDIBULAR FRACTURE N/A 11/19/2016   Procedure: Extraction (teeth numbers seven, eight, nine); Alveoloplasty; Placement maxillary and mandibular implants;  Surgeon: Diona Browner, DDS;  Location: [facility];  Service: Oral Surgery;  Laterality: N/A;  . PARATHYROIDECTOMY     Social History   Social History  . Marital status: Single    Spouse name: N/A  . Number of children: N/A  . Years of education: N/A   Social History Main Topics  . Smoking status: Never Smoker  . Smokeless tobacco: Never Used  .  Alcohol use No  . Drug use: No  . Sexual activity: Not Asked   Other Topics Concern  . None   Social History Narrative  . None   Family History  Problem Relation Age of Onset  . Hypertension Father   . Coronary artery disease Paternal Grandfather        smoker  . Diabetes Paternal Grandfather        Medication induced  . Cancer Maternal Grandfather        MM  . Hypertension Maternal Grandmother   . Migraines Mother   . Rheum arthritis Mother        undifferentiated connective tissue   Allergies  Allergen Reactions  . Amoxicillin Other (See Comments)    Blisters all over Cannot take any 'cillins' at all  . Demerol [Meperidine] Nausea And Vomiting    Can not tolerate Iv administration Can tolerate IM adminstration  . Imitrex [Sumatriptan]     Severity unknown  . Other Itching    Pink NG tape  . Penicillins Other (See Comments)    Cannot not tolerate any cillins due to amoxicillin reaction   . Sulfa Antibiotics Nausea And Vomiting  . Depacon [Valproic Acid] Nausea And Vomiting and Rash  .  Depakote [Divalproex Sodium] Nausea And Vomiting and Rash  . Dihydroergotamine Nausea And Vomiting and Rash  . Metoclopramide Nausea And Vomiting and Rash    React to IV form Can tolerate oral medication  . Morphine And Related Rash   Prior to Admission medications   Medication Sig Start Date End Date Taking? Authorizing Provider  calcitRIOL (ROCALTROL) 0.5 MCG capsule Take 0.5 mcg by mouth daily.    [provider]  calcium carbonate (CALCI-CHEW) 1250 (500 Ca) MG chewable tablet Chew 1 tablet by mouth daily.    [provider]  cholecalciferol (VITAMIN D) 1000 UNITS tablet Take 1 tablet (1,000 Units total) by mouth daily. For nutritional supplement. 06/16/12   Ruben Im, PA-C  clonazePAM (KLONOPIN) 0.5 MG tablet Take 0.25 mg by mouth at bedtime as needed for anxiety.    [provider]  cloZAPine (CLOZARIL) 100 MG tablet Take 150-200 mg by mouth  See admin instructions. 150 mg in the morning. 200 mg at 3:00 pm. 175 mg at bedtime    [provider]  docusate sodium (COLACE) 100 MG capsule Take 100 mg by mouth daily as needed for moderate constipation.     [provider]  labetalol (NORMODYNE) 100 MG tablet Take 1 tablet by mouth 3 (three) times daily. 09/07/16   [provider]  lamoTRIgine (LAMICTAL) 200 MG tablet Take 1 tablet (200 mg total) by mouth at bedtime. For mood lability. 07/26/12   Patrick Jupiter, PA-C  levothyroxine (SYNTHROID, LEVOTHROID) 125 MCG tablet Take 125 mcg by mouth daily before breakfast.     [provider]  medroxyPROGESTERone (PROVERA) 10 MG tablet Take 10 mg by mouth daily.    [provider]  Melatonin 3 MG TABS Take 9 mg by mouth at bedtime.    [provider]  metFORMIN (GLUCOPHAGE-XR) 500 MG 24 hr tablet Take 500 mg by mouth 4 (four) times daily.    [provider]  OLANZapine (ZYPREXA) 2.5 MG tablet Take 2.5-10 mg by mouth daily as needed.    [provider]  ondansetron (ZOFRAN) 4 MG tablet Take 4 mg by mouth every 8 (eight) hours as needed for nausea or vomiting.    [provider]  oxyCODONE (OXY IR/ROXICODONE) 5 MG immediate release tablet Take 1 tablet (5 mg total) by mouth every 4 (four) hours as needed. 11/20/16   Diona Browner, DDS  oxyCODONE-acetaminophen (PERCOCET/ROXICET) 5-325 MG tablet Take 1 tablet by mouth every 8 (eight) hours as needed for severe pain. 12/07/16   McDonald, Mia A, PA-C  polyethylene glycol (MIRALAX / GLYCOLAX) packet Take 17 g by mouth daily.    [provider]  ranitidine (ZANTAC) 150 MG tablet Take 150 mg by mouth 2 (two) times daily.    [provider]  tiZANidine (ZANAFLEX) 4 MG tablet Take 4 mg by mouth every 6 (six) hours as needed (neck pain).    [provider]  traMADol (ULTRAM) 50 MG tablet Take 50 mg by mouth 3 (three) times daily.    [provider]   venlafaxine XR (EFFEXOR-XR) 150 MG 24 hr capsule Take 150 mg by mouth daily with breakfast.    [provider]  vitamin B-12 (CYANOCOBALAMIN) 1000 MCG tablet Take 1,000 mcg by mouth daily.    [provider]     Positive ROS: All other systems have been reviewed and were otherwise negative with the exception of those mentioned in the HPI and as above.  Physical Exam: General: Alert,  no acute distress Cardiovascular: No pedal edema Respiratory: No cyanosis, no use of accessory musculature GI: No organomegaly, abdomen is soft and non-tender Skin: No lesions in the area of chief complaint Neurologic: Sensation intact distally Psychiatric: Patient is competent for consent with normal mood and affect Lymphatic: No axillary or cervical lymphadenopathy  MUSCULOSKELETAL: R ankle - splint CDI, +EHL though remainder of motor difficult to test due to splint, sensation intact distally with warm well perfused foot, no pain w passive stretch   Assessment: right ankle fracture  Plan: Plan for Procedure(s): OPEN REDUCTION INTERNAL FIXATION (ORIF) ANKLE FRACTURE SYNDESMOSIS REPAIR  The risks benefits and alternatives were discussed with the patient including but not limited to the risks of nonoperative treatment, versus surgical intervention including infection, bleeding, nerve injury,  blood clots, cardiopulmonary complications, morbidity, mortality, among others, and they were willing to proceed.   Hiram Gash, MD  12/15/2016 6:32 AM

## 2016-12-15 NOTE — Interval H&P Note (Signed)
History and Physical Interval Note:  12/15/2016 12:31 PM  Ashlee Day  has presented today for surgery, with the diagnosis of right ankle fracture  The various methods of treatment have been discussed with the patient and family. After consideration of risks, benefits and other options for treatment, the patient has consented to  Procedure(s): OPEN REDUCTION INTERNAL FIXATION (ORIF) ANKLE FRACTURE (Right) SYNDESMOSIS REPAIR (Right) as a surgical intervention .  The patient's history has been reviewed, patient examined, no change in status, stable for surgery.  I have reviewed the patient's chart and labs.  Questions were answered to the patient's satisfaction.     Patient discussed the surgery with their cardiologist who approved their procedure going forward from a medical standpoint.  Hiram Gash

## 2016-12-16 ENCOUNTER — Encounter (HOSPITAL_COMMUNITY): Payer: Self-pay | Admitting: General Practice

## 2016-12-16 DIAGNOSIS — S82891A Other fracture of right lower leg, initial encounter for closed fracture: Secondary | ICD-10-CM | POA: Diagnosis not present

## 2016-12-16 LAB — CBC WITH DIFFERENTIAL/PLATELET
Basophils Absolute: 0 10*3/uL (ref 0.0–0.1)
Basophils Relative: 0 %
EOS PCT: 0 %
Eosinophils Absolute: 0 10*3/uL (ref 0.0–0.7)
HCT: 35.1 % — ABNORMAL LOW (ref 36.0–46.0)
Hemoglobin: 11 g/dL — ABNORMAL LOW (ref 12.0–15.0)
Lymphocytes Relative: 6 %
Lymphs Abs: 0.6 10*3/uL — ABNORMAL LOW (ref 0.7–4.0)
MCH: 25.3 pg — AB (ref 26.0–34.0)
MCHC: 31.3 g/dL (ref 30.0–36.0)
MCV: 80.7 fL (ref 78.0–100.0)
MONO ABS: 0.7 10*3/uL (ref 0.1–1.0)
Monocytes Relative: 8 %
NEUTROS ABS: 7.8 10*3/uL — AB (ref 1.7–7.7)
NEUTROS PCT: 86 %
PLATELETS: 258 10*3/uL (ref 150–400)
RBC: 4.35 MIL/uL (ref 3.87–5.11)
RDW: 15.2 % (ref 11.5–15.5)
WBC: 9.1 10*3/uL (ref 4.0–10.5)

## 2016-12-16 MED ORDER — ONDANSETRON HCL 4 MG PO TABS: 4.0000 mg | ORAL_TABLET | Freq: Three times a day (TID) | ORAL | 1 refills | Status: AC | PRN

## 2016-12-16 MED ORDER — ENOXAPARIN SODIUM 40 MG/0.4ML ~~LOC~~ SOLN
40.0000 mg | Freq: Once | SUBCUTANEOUS | Status: AC
  Administered 2016-12-16: 40 mg via SUBCUTANEOUS
  Filled 2016-12-16: qty 0.4

## 2016-12-16 MED ORDER — INFLUENZA VAC SPLIT QUAD 0.5 ML IM SUSY: 0.5000 mL | PREFILLED_SYRINGE | INTRAMUSCULAR | Status: DC

## 2016-12-16 MED ORDER — ENOXAPARIN SODIUM 40 MG/0.4ML ~~LOC~~ SOLN: 40.0000 mg | SUBCUTANEOUS | 0 refills | Status: DC

## 2016-12-16 MED ORDER — BISACODYL 5 MG PO TBEC: 5.0000 mg | DELAYED_RELEASE_TABLET | Freq: Every day | ORAL | 1 refills | Status: AC | PRN

## 2016-12-16 MED ORDER — OXYCODONE HCL 5 MG PO TABS: ORAL_TABLET | ORAL | 0 refills | Status: AC

## 2016-12-16 NOTE — Care Management Note (Signed)
Case Management Note  Patient Details  Name: Ashlee Day MRN: [id number] Date of Birth: October 09, 1985  Subjective/Objective:                    Action/Plan: Pt discharging home with self care. Pt has insurance, PCP and transportation home. No further needs per CM.   Expected Discharge Date:  12/16/16               Expected Discharge Plan:  Home/Self Care  In-House Referral:     Discharge planning Services     Post Acute Care Choice:    Choice offered to:     DME Arranged:    DME Agency:     HH Arranged:    HH Agency:     Status of Service:  Completed, signed off  If discussed at [employer] of Stay Meetings, dates discussed:    Additional Comments:  Pollie Friar, RN 12/16/2016, 10:36 AM

## 2016-12-16 NOTE — Progress Notes (Signed)
Discharge instructions RXs and follow up appts explained and provided to patient and mother verbalized understanding. Patient left floor via wheelchair accompanied by staff no c/o pain or shortness of breath at d/c.  Valaree Fresquez, Tivis Ringer, RN

## 2016-12-20 NOTE — Discharge Summary (Signed)
Physician Discharge Summary  Patient ID: Ashlee Day MRN: [id number] DOB/AGE: [birth year] 31 y.o.  Admit date: 12/15/2016 Discharge date: 12/20/2016  Admission Diagnoses:R ankle fracture  Discharge Diagnoses:  Active Problems:   Traumatic closed nondisplaced fracture of posterior malleolus   Discharged Condition: good  Hospital Course: Patient brought in as an outpatient for surgery.  Tolerated procedure well.  Was kept for monitoring overnight for pain control and medical monitoring postop and was found to be stable for DC home the morning after surgery.  Consults: None  Significant Diagnostic Studies: none  Treatments: IV hydration and antibiotics: Ancef  Discharge Exam: Blood pressure 140/82, pulse 100, temperature 98.4 F (36.9 C), temperature source Axillary, resp. rate 18, weight 93.9 kg (207 lb), last menstrual period 12/08/2016, SpO2 92 %. NAD. splint CDI, +EHL though remainder of motor difficult to test due to splint, sensation intact distally with warm well perfused foot, no pain w passive stretch  Disposition: 01-Home or Self Care  Discharge Instructions    Call MD / Call 911    Complete by:  As directed    If you experience chest pain or shortness of breath, CALL 911 and be transported to the hospital emergency room.  If you develope a fever above 101 F, pus (white drainage) or increased drainage or redness at the wound, or calf pain, call your surgeon's office.   Constipation Prevention    Complete by:  As directed    Drink plenty of fluids.  Prune juice may be helpful.  You may use a stool softener, such as Colace (over the counter) 100 mg twice a day.  Use MiraLax (over the counter) for constipation as needed.   Diet - low sodium heart healthy    Complete by:  As directed    Discharge instructions    Complete by:  As directed    Ophelia Charter MD, MPH [place] [address] [phone] (tel)   [phone]  (fax)   POST-OPERATIVE INSTRUCTIONS   WOUND CARE Please keep splint clean dry and intact until followup.  You may shower on Post-Op Day #2. You must keep splint dry during this process and may find that a plastic bag taped around the leg or alternatively a towel based bath may be a better option.  If you get your splint wet or if it is damaged please contact our clinic.  EXERCISES Due to your splint being in place you will not be able to bear weight through your extremity.  Please use crutches or a walker to avoid weight bearing.   POST-OP MEDICINES A multi-modal approach will be used to treat your pain. Oxycodone - This is a strong narcotic, to be used only on an "as needed" basis for pain. Acetaminophen 500mg - A non-narcotic pain medicine.  Use 1000mg  three times a day for the first 14 days after surgery   Zofran 4mg  - This is an anti-nausea medicine to be used only if you are having nausea or vomitting. Lovenox 40mg  - This is a medicine used to help prevent blood clots.  Please use it daily.   If you have any adverse effects with the medications, please call our office.  FOLLOW-UP If you develop a Fever (101.5), Redness or Drainage from the surgical incision site, please call our office to arrange for an evaluation. Please call the office to schedule a follow-up appointment for your suture removal, 10-14 days post-operatively.  IF YOU HAVE ANY QUESTIONS, PLEASE FEEL FREE TO CALL  OUR OFFICE.   Increase activity slowly as tolerated    Complete by:  As directed      Allergies as of 12/16/2016      Reactions   Amoxicillin Other (See Comments)   Blisters all over Cannot take any 'cillins' at all   Demerol [meperidine] Nausea And Vomiting, Other (See Comments)   Can not tolerate Iv administration Can tolerate IM adminstration   Imitrex [sumatriptan] Other (See Comments)   Severity unknown   Other Itching, Other (See Comments)   Pink NG tape   Penicillins Other (See Comments)    Cannot not tolerate any cillins due to amoxicillin reaction   Sulfa Antibiotics Nausea And Vomiting   Depacon [valproic Acid] Nausea And Vomiting, Rash   Depakote [divalproex Sodium] Nausea And Vomiting, Rash   Dihydroergotamine Nausea And Vomiting, Rash   Metoclopramide Nausea And Vomiting, Rash, Other (See Comments)   React to IV form Can tolerate oral medication   Morphine And Related Rash, Other (See Comments)   IV form      Medication List    STOP taking these medications   oxyCODONE-acetaminophen 5-325 MG tablet Commonly known as:  PERCOCET/ROXICET     TAKE these medications   ACIDOPHILUS PO Take 1 capsule by mouth 3 (three) times daily.   bisacodyl 5 MG EC tablet Commonly known as:  DULCOLAX Take 1 tablet (5 mg total) by mouth daily as needed for moderate constipation.   CALCI-CHEW 1250 (500 Ca) MG chewable tablet Generic drug:  calcium carbonate Chew 1 tablet by mouth daily.   calcitRIOL 0.5 MCG capsule Commonly known as:  ROCALTROL Take 0.5 mcg by mouth daily.   cholecalciferol 1000 units tablet Commonly known as:  VITAMIN D Take 1 tablet (1,000 Units total) by mouth daily. For nutritional supplement.   clonazePAM 0.5 MG tablet Commonly known as:  KLONOPIN Take 0.25 mg by mouth at bedtime as needed for anxiety.   cloZAPine 100 MG tablet Commonly known as:  CLOZARIL Take 150-200 mg by mouth See admin instructions. 150 mg in the morning. 200 mg at 3:00 pm. 175 mg at bedtime   enoxaparin 40 MG/0.4ML injection Commonly known as:  LOVENOX Inject 0.4 mLs (40 mg total) into the skin daily.   labetalol 100 MG tablet Commonly known as:  NORMODYNE Take 100 mg by mouth 3 (three) times daily.   lamoTRIgine 200 MG tablet Commonly known as:  LAMICTAL Take 1 tablet (200 mg total) by mouth at bedtime. For mood lability.   levothyroxine 112 MCG tablet Commonly known as:  SYNTHROID, LEVOTHROID Take 112 mcg by mouth daily before breakfast.   LO LOESTRIN FE 1 MG-10  MCG / 10 MCG tablet Generic drug:  Norethindrone-Ethinyl Estradiol-Fe Biphas Take 1 tablet by mouth daily.   Melatonin 3 MG Tabs Take 9 mg by mouth at bedtime.   metFORMIN 500 MG 24 hr tablet Commonly known as:  GLUCOPHAGE-XR Take 500 mg by mouth 4 (four) times daily.   OLANZapine 2.5 MG tablet Commonly known as:  ZYPREXA Take 2.5-10 mg by mouth daily as needed (for anxiety).   ondansetron 4 MG tablet Commonly known as:  ZOFRAN Take 1 tablet (4 mg total) by mouth every 8 (eight) hours as needed for nausea or vomiting. What changed:  You were already taking a medication with the same name, and this prescription was added. Make sure you understand how and when to take each.   ondansetron 4 MG tablet Commonly known as:  ZOFRAN Take 4 mg  by mouth every 8 (eight) hours as needed for nausea or vomiting. What changed:  Another medication with the same name was added. Make sure you understand how and when to take each.   oxyCODONE 5 MG immediate release tablet Commonly known as:  Oxy IR/ROXICODONE Take 1 tablet (5 mg total) by mouth every 4 (four) hours as needed. What changed:  Another medication with the same name was added. Make sure you understand how and when to take each.   oxyCODONE 5 MG immediate release tablet Commonly known as:  Oxy IR/ROXICODONE Take 1-2 pills every 6 hrs as needed for pain What changed:  You were already taking a medication with the same name, and this prescription was added. Make sure you understand how and when to take each.   polyethylene glycol packet Commonly known as:  MIRALAX / GLYCOLAX Take 17 g by mouth at bedtime.   ranitidine 150 MG tablet Commonly known as:  ZANTAC Take 150 mg by mouth 2 (two) times daily.   tiZANidine 4 MG tablet Commonly known as:  ZANAFLEX Take 4 mg by mouth every 6 (six) hours as needed (for neck pain).   traMADol 50 MG tablet Commonly known as:  ULTRAM Take 50 mg by mouth 3 (three) times daily.   venlafaxine XR  150 MG 24 hr capsule Commonly known as:  EFFEXOR-XR Take 150 mg by mouth daily with breakfast.   vitamin B-12 1000 MCG tablet Commonly known as:  CYANOCOBALAMIN Take 1,000 mcg by mouth daily.   XTAMPZA ER 9 MG C12a Generic drug:  OxyCODONE ER Take 9 mg by mouth 2 (two) times daily.            Discharge Care Instructions        Start     Ordered   12/16/16 0000  Call MD / Call 911    Comments:  If you experience chest pain or shortness of breath, CALL 911 and be transported to the hospital emergency room.  If you develope a fever above 101 F, pus (white drainage) or increased drainage or redness at the wound, or calf pain, call your surgeon's office.   12/16/16 1012   12/16/16 0000  Diet - low sodium heart healthy     12/16/16 1012   12/16/16 0000  Constipation Prevention    Comments:  Drink plenty of fluids.  Prune juice may be helpful.  You may use a stool softener, such as Colace (over the counter) 100 mg twice a day.  Use MiraLax (over the counter) for constipation as needed.   12/16/16 1012   12/16/16 0000  Increase activity slowly as tolerated     12/16/16 1012   12/16/16 0000  Discharge instructions    Comments:  Ophelia Charter MD, MPH [facility] [address] [phone] (tel)   [phone] (fax)   POST-OPERATIVE INSTRUCTIONS   WOUND CARE Please keep splint clean dry and intact until followup.  You may shower on Post-Op Day #2. You must keep splint dry during this process and may find that a plastic bag taped around the leg or alternatively a towel based bath may be a better option.  If you get your splint wet or if it is damaged please contact our clinic.  EXERCISES Due to your splint being in place you will not be able to bear weight through your extremity.  Please use crutches or a walker to avoid weight bearing.   POST-OP MEDICINES A multi-modal approach will  be used to treat your pain. Oxycodone - This is a strong  narcotic, to be used only on an "as needed" basis for pain. Acetaminophen 500mg - A non-narcotic pain medicine.  Use 1000mg  three times a day for the first 14 days after surgery   Zofran 4mg  - This is an anti-nausea medicine to be used only if you are having nausea or vomitting. Lovenox 40mg  - This is a medicine used to help prevent blood clots.  Please use it daily.   If you have any adverse effects with the medications, please call our office.  FOLLOW-UP If you develop a Fever (101.5), Redness or Drainage from the surgical incision site, please call our office to arrange for an evaluation. Please call the office to schedule a follow-up appointment for your suture removal, 10-14 days post-operatively.  IF YOU HAVE ANY QUESTIONS, PLEASE FEEL FREE TO CALL OUR OFFICE.   12/16/16 1012   12/16/16 0000  bisacodyl (DULCOLAX) 5 MG EC tablet  Daily PRN     12/16/16 1012   12/16/16 0000  oxyCODONE (OXY IR/ROXICODONE) 5 MG immediate release tablet     12/16/16 1012   12/16/16 0000  ondansetron (ZOFRAN) 4 MG tablet  Every 8 hours PRN     12/16/16 1012   12/16/16 0000  enoxaparin (LOVENOX) 40 MG/0.4ML injection  Every 24 hours     12/16/16 1012       Signed: Dax T Varkey 12/20/2016, 9:01 AM

## 2017-02-08 NOTE — Anesthesia Postprocedure Evaluation (Signed)
Anesthesia Post Note  Patient: Ashlee Day  Procedure(s) Performed: OPEN REDUCTION INTERNAL FIXATION (ORIF) ANKLE FRACTURE (Right ) SYNDESMOSIS REPAIR (Right )     Patient location during evaluation: PACU Anesthesia Type: General Level of consciousness: awake and sedated Pain management: pain level controlled Vital Signs Assessment: post-procedure vital signs reviewed and stable Respiratory status: spontaneous breathing, nonlabored ventilation, respiratory function stable and patient connected to nasal cannula oxygen Cardiovascular status: blood pressure returned to baseline and stable Postop Assessment: no apparent nausea or vomiting Anesthetic complications: no    Last Vitals:  Vitals:   12/16/16 0002 12/16/16 0402  BP: 122/80 140/82  Pulse: 93 100  Resp: 17 18  Temp: 36.6 C 36.9 C  SpO2: 91% 92%    Last Pain:  Vitals:   12/16/16 1353  TempSrc:   PainSc: 2                  Pesach Frisch,JAMES TERRILL

## 2017-02-18 ENCOUNTER — Ambulatory Visit (HOSPITAL_COMMUNITY)
Admission: RE | Admit: 2017-02-18 | Payer: Managed Care, Other (non HMO) | Source: Ambulatory Visit | Admitting: Oral Surgery

## 2017-02-18 ENCOUNTER — Encounter (HOSPITAL_COMMUNITY): Admission: RE | Payer: Self-pay | Source: Ambulatory Visit

## 2017-02-18 SURGERY — DENTAL RESTORATION/EXTRACTION WITH X-RAY
Anesthesia: General

## 2017-11-27 ENCOUNTER — Other Ambulatory Visit: Payer: Self-pay

## 2017-11-27 ENCOUNTER — Emergency Department (HOSPITAL_BASED_OUTPATIENT_CLINIC_OR_DEPARTMENT_OTHER): Payer: Managed Care, Other (non HMO)

## 2017-11-27 ENCOUNTER — Encounter (HOSPITAL_BASED_OUTPATIENT_CLINIC_OR_DEPARTMENT_OTHER): Payer: Self-pay | Admitting: Emergency Medical Services

## 2017-11-27 ENCOUNTER — Emergency Department (HOSPITAL_BASED_OUTPATIENT_CLINIC_OR_DEPARTMENT_OTHER)
Admission: EM | Admit: 2017-11-27 | Discharge: 2017-11-27 | Disposition: A | Discharge status: Home / Self Care | Payer: Managed Care, Other (non HMO) | Attending: Emergency Medicine | Admitting: Emergency Medicine

## 2017-11-27 DIAGNOSIS — Z85850 Personal history of malignant neoplasm of thyroid: Secondary | ICD-10-CM | POA: Insufficient documentation

## 2017-11-27 DIAGNOSIS — R0789 Other chest pain: Secondary | ICD-10-CM

## 2017-11-27 DIAGNOSIS — Z79899 Other long term (current) drug therapy: Secondary | ICD-10-CM | POA: Diagnosis not present

## 2017-11-27 DIAGNOSIS — E039 Hypothyroidism, unspecified: Secondary | ICD-10-CM | POA: Diagnosis not present

## 2017-11-27 DIAGNOSIS — Z7984 Long term (current) use of oral hypoglycemic drugs: Secondary | ICD-10-CM | POA: Diagnosis not present

## 2017-11-27 DIAGNOSIS — F319 Bipolar disorder, unspecified: Secondary | ICD-10-CM | POA: Insufficient documentation

## 2017-11-27 DIAGNOSIS — R0602 Shortness of breath: Secondary | ICD-10-CM | POA: Diagnosis present

## 2017-11-27 LAB — CBC WITH DIFFERENTIAL/PLATELET
BASOS PCT: 0 %
Basophils Absolute: 0 10*3/uL (ref 0.0–0.1)
EOS PCT: 1 %
Eosinophils Absolute: 0.1 10*3/uL (ref 0.0–0.7)
HCT: 40.7 % (ref 36.0–46.0)
Hemoglobin: 13.1 g/dL (ref 12.0–15.0)
LYMPHS ABS: 2.1 10*3/uL (ref 0.7–4.0)
Lymphocytes Relative: 22 %
MCH: 26.4 pg (ref 26.0–34.0)
MCHC: 32.2 g/dL (ref 30.0–36.0)
MCV: 81.9 fL (ref 78.0–100.0)
MONO ABS: 0.8 10*3/uL (ref 0.1–1.0)
Monocytes Relative: 8 %
Neutro Abs: 6.6 10*3/uL (ref 1.7–7.7)
Neutrophils Relative %: 69 %
PLATELETS: 272 10*3/uL (ref 150–400)
RBC: 4.97 MIL/uL (ref 3.87–5.11)
RDW: 14.4 % (ref 11.5–15.5)
WBC: 9.6 10*3/uL (ref 4.0–10.5)

## 2017-11-27 LAB — BASIC METABOLIC PANEL
Anion gap: 10 (ref 5–15)
BUN: 15 mg/dL (ref 6–20)
CALCIUM: 8.7 mg/dL — AB (ref 8.9–10.3)
CO2: 24 mmol/L (ref 22–32)
Chloride: 105 mmol/L (ref 98–111)
Creatinine, Ser: 0.94 mg/dL (ref 0.44–1.00)
GFR calc Af Amer: >60 mL/min (ref >60)
GLUCOSE: 90 mg/dL (ref 70–99)
Potassium: 3.9 mmol/L (ref 3.5–5.1)
Sodium: 139 mmol/L (ref 135–145)

## 2017-11-27 LAB — BRAIN NATRIURETIC PEPTIDE: B Natriuretic Peptide: 19.6 pg/mL (ref 0.0–100.0)

## 2017-11-27 LAB — D-DIMER, QUANTITATIVE: D-Dimer, Quant: <0.27 ug/mL-FEU (ref 0.00–0.50)

## 2017-11-27 LAB — TROPONIN I: Troponin I: <0.03 ng/mL (ref <0.03)

## 2017-11-27 MED ORDER — SODIUM CHLORIDE 0.9 % IV BOLUS
1000.0000 mL | Freq: Once | INTRAVENOUS | Status: AC
  Administered 2017-11-27: 1000 mL via INTRAVENOUS

## 2017-11-27 MED ORDER — KETOROLAC TROMETHAMINE 30 MG/ML IJ SOLN
30.0000 mg | Freq: Once | INTRAMUSCULAR | Status: AC
  Administered 2017-11-27: 30 mg via INTRAVENOUS
  Filled 2017-11-27: qty 1

## 2017-11-27 MED ORDER — PROMETHAZINE HCL 25 MG PO TABS: 25.0000 mg | ORAL_TABLET | Freq: Four times a day (QID) | ORAL | 0 refills | Status: DC | PRN

## 2017-11-27 MED ORDER — ONDANSETRON 4 MG PO TBDP
4.0000 mg | ORAL_TABLET | Freq: Once | ORAL | Status: AC
  Administered 2017-11-27: 4 mg via ORAL
  Filled 2017-11-27: qty 1

## 2017-11-27 MED ORDER — ONDANSETRON HCL 4 MG/2ML IJ SOLN
4.0000 mg | Freq: Once | INTRAMUSCULAR | Status: AC
  Administered 2017-11-27: 4 mg via INTRAVENOUS
  Filled 2017-11-27: qty 2

## 2017-11-27 MED ORDER — TRAMADOL HCL 50 MG PO TABS
50.0000 mg | ORAL_TABLET | Freq: Once | ORAL | Status: AC
  Administered 2017-11-27: 50 mg via ORAL
  Filled 2017-11-27: qty 1

## 2017-11-27 NOTE — ED Notes (Signed)
Spoke with Ashlee Day in lab to add bnp and troponin

## 2017-11-27 NOTE — ED Notes (Signed)
Pt ambulated to bathroom 

## 2017-11-27 NOTE — ED Notes (Signed)
Pt states she has no relief from pain since receiving pain medication.

## 2017-11-27 NOTE — ED Notes (Signed)
ED Provider at bedside. 

## 2017-11-27 NOTE — ED Notes (Signed)
Pt c/o sob with worsening when lying down. She has been feeling sob for approx. 9 days and was prescribed zpack and prednisone for possible pneumonia according to mother. She states she is also experiencing tachycardia and what feels like a "bump" against her chest with some heart beats. Pt has a diagnosis mem1 and this feeling is not the normal for her. She has finished taking the antibiotic and steroid treatment and is not feeling better. Is scheduled to visit cardiologist on Friday 8/30

## 2017-11-27 NOTE — ED Provider Notes (Signed)
[facility] EMERGENCY DEPARTMENT Provider Note   CSN: [id number] Arrival date & time: 11/27/17  1252     History   Chief Complaint Chief Complaint  Patient presents with  . Shortness of Breath    HPI Ashlee Day is a 32 y.o. female.  HPI  Ashlee Day is a 31 year old female with a history of multiple endocrine neoplasia 1, intermittent tachycardia (on metoprolol), PCOS (on OCP) who presents to the emergency department for evaluation of shortness of breath, left-sided chest pain and low-grade fevers.  Patient reports that she was seen at the urgent care 10 days ago for S OB where she had a chest x-ray concerning for an early pneumonia and was discharged with antibiotic and steroid burst.  Despite taking this, she reports that her shortness of breath has not improved.  She also has developed left-sided nonradiating chest pain.  Chest pain is described as sharp, severe and constant.  It is worsened with taking a deep breath or with laying flat.  Her mother reports that she has had a low-grade temperature of 99.9 F at home.  No alleviating factors.  She has not taken any antipyretics today.  She denies history of PE/DVT, recent surgery or immobilization, unilateral leg swelling or calf tenderness.  She does take oral contraceptives.  Reports that she gets occasional wheezing at night time and uses an albuterol nebulizer which helps.  She denies cough, congestion, rhinorrhea, sore throat, headache, neck stiffness, abdominal pain, vomiting, lightheadedness or syncope.  She occasionally has palpitations, this is not new for her.  No history of exertional chest pain.  Her paternal grandfather had a heart attack at age 20 years old.  Past Medical History:  Diagnosis Date  . Ankle fracture    right  . Bipolar disorder ([place])   . [facility])    THYROID CANCER  . Chronic pain disorder   . Depression   . Dysrhythmia    SURGES TACHYCARDIA    . Family history of adverse reaction to  anesthesia    mother also has nausea   . Fibromyalgia   . GERD (gastroesophageal reflux disease)   . Hashimoto's thyroiditis    2005ish  . History of kidney stones    2013+2017  . Hyperparathyroidism ([place])   . Hyperparathyroidism, unspecified ([place])   . Hypothyroidism   . Ileus ([place])    x3-4  . MEN 1 (multiple endocrine neoplasia) ([place])   . Migraines    occasional aura  . Partial edentulism   . Pituitary adenoma ([place])   . Polycystic disease, ovaries   . PONV (postoperative nausea and vomiting)   . 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 Torticollis     Patient Active Problem List   Diagnosis Date Noted  . Traumatic closed nondisplaced fracture of posterior malleolus 12/15/2016  . Post-op pain 11/19/2016  . Borderline personality disorder ([place]) 06/11/2012    Class: Chronic  . 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 Drug-seeking behavior 06/08/2012  . Polypharmacy 05/25/2012  . Major depressive disorder, recurrent episode, severe, specified as with psychotic behavior 05/23/2012  . Unspecified episodic mood disorder 02/23/2012  . Constipation 12/16/2011  . Fibromyalgia   . Depression   . Ileus ([place])   . Hashimoto's thyroiditis   . Hyperparathyroidism, unspecified ([place])   . Kidney stone   . Bipolar disorder   . GERD (gastroesophageal reflux disease)   . Migraines   . Partial edentulism  Past Surgical History:  Procedure Laterality Date  . appendicitis     2003  . DENTAL SURGERY     MULT  . DENTAL SURGERY  11/19/2016  . DILATION AND CURETTAGE OF UTERUS         1999    TOTAL 3  . LAPAROSCOPY    . ORIF ANKLE FRACTURE Right 12/15/2016  . ORIF ANKLE FRACTURE Right 12/15/2016   Procedure: OPEN REDUCTION INTERNAL FIXATION (ORIF) ANKLE FRACTURE;  Surgeon: Hiram Gash, MD;  Location: [facility];  Service: Orthopedics;  Laterality: Right;  . ORIF MANDIBULAR FRACTURE N/A 11/19/2016   Procedure: Extraction (teeth numbers seven, eight, nine);  Alveoloplasty; Placement maxillary and mandibular implants;  Surgeon: Diona Browner, DDS;  Location: [facility];  Service: Oral Surgery;  Laterality: N/A;  . PARATHYROIDECTOMY    . SYNDESMOSIS REPAIR Right 12/15/2016   Procedure: SYNDESMOSIS REPAIR;  Surgeon: Hiram Gash, MD;  Location: [facility];  Service: Orthopedics;  Laterality: Right;     OB History   None      Home Medications    Prior to Admission medications   Medication Sig Start Date End Date Taking? Authorizing Provider  albuterol (PROVENTIL HFA;VENTOLIN HFA) 108 (90 Base) MCG/ACT inhaler Inhale into the lungs every 6 (six) hours as needed for wheezing or shortness of breath.   Yes [provider]  clonazePAM (KLONOPIN) 0.5 MG tablet Take 0.25 mg by mouth at bedtime as needed for anxiety.   Yes [provider]  cloZAPine (CLOZARIL) 100 MG tablet Take 150-200 mg by mouth See admin instructions. 150 mg in the morning. 200 mg at 3:00 pm. 175 mg at bedtime   Yes [provider]  lamoTRIgine (LAMICTAL) 200 MG tablet Take 1 tablet (200 mg total) by mouth at bedtime. For mood lability. 07/26/12  Yes Watt, Lajuan Lines, PA-C  levothyroxine (SYNTHROID, LEVOTHROID) 112 MCG tablet Take 112 mcg by mouth daily before breakfast.    Yes [provider]  metFORMIN (GLUCOPHAGE-XR) 500 MG 24 hr tablet Take 500 mg by mouth 4 (four) times daily.   Yes [provider]  Norethindrone-Ethinyl Estradiol-Fe Biphas (LO LOESTRIN FE) 1 MG-10 MCG / 10 MCG tablet Take 1 tablet by mouth daily.   Yes [provider]  OLANZapine (ZYPREXA) 2.5 MG tablet Take 2.5-10 mg by mouth daily as needed (for anxiety).    Yes [provider]  calcitRIOL (ROCALTROL) 0.5 MCG capsule Take 0.5 mcg by mouth daily.    [provider]  calcium carbonate (CALCI-CHEW) 1250 (500 Ca) MG chewable tablet Chew 1 tablet by mouth daily.    [provider]  cholecalciferol (VITAMIN D) 1000 UNITS tablet Take 1 tablet (1,000  Units total) by mouth daily. For nutritional supplement. 06/16/12   Nena Polio T, PA-C  enoxaparin (LOVENOX) 40 MG/0.4ML injection Inject 0.4 mLs (40 mg total) into the skin daily. 12/16/16   Hiram Gash, MD  labetalol (NORMODYNE) 100 MG tablet Take 100 mg by mouth 3 (three) times daily.  09/07/16   [provider]  Lactobacillus (ACIDOPHILUS PO) Take 1 capsule by mouth 3 (three) times daily.    [provider]  Melatonin 3 MG TABS Take 9 mg by mouth at bedtime.    [provider]  ondansetron (ZOFRAN) 4 MG tablet Take 4 mg by mouth every 8 (eight) hours as needed for nausea or vomiting.    [provider]  oxyCODONE (OXY IR/ROXICODONE) 5 MG immediate release tablet Take 1 tablet (5 mg  total) by mouth every 4 (four) hours as needed. Patient not taking: Reported on 12/15/2016 11/20/16   Diona Browner, DDS  polyethylene glycol (MIRALAX / Floria Raveling) packet Take 17 g by mouth at bedtime.     [provider]  ranitidine (ZANTAC) 150 MG tablet Take 150 mg by mouth 2 (two) times daily.    [provider]  tiZANidine (ZANAFLEX) 4 MG tablet Take 4 mg by mouth every 6 (six) hours as needed (for neck pain).     [provider]  traMADol (ULTRAM) 50 MG tablet Take 50 mg by mouth 3 (three) times daily.    [provider]  venlafaxine XR (EFFEXOR-XR) 150 MG 24 hr capsule Take 150 mg by mouth daily with breakfast.    [provider]  vitamin B-12 (CYANOCOBALAMIN) 1000 MCG tablet Take 1,000 mcg by mouth daily.    [provider]  XTAMPZA ER 9 MG C12A Take 9 mg by mouth 2 (two) times daily. 11/07/16   [provider]    Family History Family History  Problem Relation Age of Onset  . Hypertension Father   . Coronary artery disease Paternal Grandfather        smoker  . Diabetes Paternal Grandfather        Medication induced  . Cancer Maternal Grandfather        MM  . Hypertension Maternal Grandmother   .  Migraines Mother   . Rheum arthritis Mother        undifferentiated connective tissue    Social History Social History   Tobacco Use  . Smoking status: Never Smoker  . Smokeless tobacco: Never Used  Substance Use Topics  . Alcohol use: No  . Drug use: No     Allergies   Amoxicillin; Demerol [meperidine]; Imitrex [sumatriptan]; Other; Penicillins; Sulfa antibiotics; Depacon [valproic acid]; Depakote [divalproex sodium]; Dihydroergotamine; Metoclopramide; and Morphine and related   Review of Systems Review of Systems  Constitutional: Positive for fever.  HENT: Negative for congestion, rhinorrhea and sore throat.   Respiratory: Positive for shortness of breath and wheezing. Negative for cough.   Cardiovascular: Positive for chest pain and palpitations. Negative for leg swelling.  Gastrointestinal: Negative for abdominal pain, nausea and vomiting.  Genitourinary: Negative for difficulty urinating.  Musculoskeletal: Negative for back pain.  Skin: Negative for rash.  Neurological: Negative for dizziness, syncope, light-headedness and headaches.  Psychiatric/Behavioral: Negative for agitation.   Physical Exam Updated Vital Signs BP (!) 135/93 (BP Location: Right Arm)   Pulse 91   Temp 97.7 F (36.5 C) (Oral)   Resp (!) 21   Ht 5\' 4"  (1.626 m)   Wt 93 kg   LMP 11/06/2017   SpO2 98%   BMI 35.19 kg/m   Physical Exam  Constitutional: She is oriented to person, place, and time. She appears well-developed and well-nourished. No distress.  NAD.   HENT:  Head: Normocephalic and atraumatic.  Mouth/Throat: Oropharynx is clear and moist.  Eyes: Right eye exhibits no discharge. Left eye exhibits no discharge.  Neck: Normal range of motion. No JVD present. No tracheal deviation present.  No JVD.  Cardiovascular: Normal rate, regular rhythm and intact distal pulses.  No murmur heard. No friction rub.  Pulmonary/Chest: Effort normal and breath sounds normal. No stridor. No  respiratory distress. She has no wheezes. She has no rales.  No respiratory distress.  Speaking in full sentences.  Lungs clear to auscultation.  Mild tenderness with palpation over the left chest wall, no  overlying rash.  Abdominal: Soft. Bowel sounds are normal. There is no tenderness.  Musculoskeletal:  No leg swelling or calf tenderness.  DP pulses 2+ and symmetric bilaterally.  Neurological: She is alert and oriented to person, place, and time. Coordination normal.  Skin: Skin is warm and dry. She is not diaphoretic.  Psychiatric: She has a normal mood and affect. Her behavior is normal.  Nursing note and vitals reviewed.    ED Treatments / Results  Labs (all labs ordered are listed, but only abnormal results are displayed) Labs Reviewed  BASIC METABOLIC PANEL - Abnormal; Notable for the following components:      Result Value   Calcium 8.7 (*)    All other components within normal limits  CBC WITH DIFFERENTIAL/PLATELET  D-DIMER, QUANTITATIVE (NOT AT [facility])  BRAIN NATRIURETIC PEPTIDE  TROPONIN I    EKG None  Radiology Dg Chest 2 View  Result Date: 11/27/2017 CLINICAL DATA:  Shortness of breath EXAM: CHEST - 2 VIEW COMPARISON:  None. FINDINGS: Low lung volumes. Mild left basilar scarring/atelectasis. No focal consolidation. No pleural effusion or pneumothorax. The heart is normal in size. Visualized osseous structures are within normal limits. IMPRESSION: No evidence of acute cardiopulmonary disease. Electronically Signed   By: Julian Hy M.D.   On: 11/27/2017 13:24    Procedures Procedures (including critical care time)  Medications Ordered in ED Medications  ondansetron (ZOFRAN-ODT) disintegrating tablet 4 mg (4 mg Oral Given 11/27/17 1457)  sodium chloride 0.9 % bolus 1,000 mL (0 mLs Intravenous Stopped 11/27/17 1616)  ketorolac (TORADOL) 30 MG/ML injection 30 mg (30 mg Intravenous Given 11/27/17 1545)  traMADol (ULTRAM) tablet 50 mg (50 mg Oral Given 11/27/17  1706)  ondansetron (ZOFRAN) injection 4 mg (4 mg Intravenous Given 11/27/17 1808)    Initial Impression / Assessment and Plan / ED Course  I have reviewed the triage vital signs and the nursing notes.  Pertinent labs & imaging results that were available during my care of the patient were reviewed by me and considered in my medical decision making (see chart for details).     Patient with atypical chest pain, shortness of breath and reported fevers at home. CXR negative for acute abnormality, no pneumonia, pneumothorax or pneumomediastinum. CBC and BMP unremarkable. Ddimer negative, doubt PE. She has a family history of MI at young age, went ahead and got a troponin which was negative. EKG non-ischemic. Do not suspect ACS given presentation and labwork/EKG. She is afebrile, no friction rub and no diffuse ST elevation to suggest pericarditis. She is afebrile, non-toxic, no fever or leukocytosis and I doubt esophageal perforation. She remained hemodynamically stable in the ED with normal pulse oxymetry readings on room air. Her pain may be musculoskeletal given worse with palpation.  There may be an element of chest wall pain given her pain is worsened with palpation.  Based on exam and results today I do not suspect emergent etiology of patients chest pain requiring further labwork or admission.  Have discussed strict return precautions and she and her mother at bedside agree and appear reliable.  Final Clinical Impressions(s) / ED Diagnoses   Final diagnoses:  Atypical chest pain    ED Discharge Orders         Ordered    promethazine (PHENERGAN) 25 MG tablet  Every 6 hours PRN     11/27/17 1805           Bernarda Caffey 11/28/17 0012    Mesner, Corene Cornea, MD  11/29/17 0712  

## 2017-11-27 NOTE — ED Triage Notes (Signed)
SOB x 5 days. Seen at [facility] and there was concern for pneumonia on the cxr. She was treated with abx and steroids. Pt c/o ongoing SOB and palpitations.

## 2017-11-27 NOTE — ED Notes (Signed)
Ashlee Day, [place] made aware pt reports pain unchanged and nausea continues.

## 2017-11-27 NOTE — Discharge Instructions (Addendum)
Your blood work, EKG and chest x-ray were reassuring.  Please follow-up with your cardiologist as scheduled on Friday.  You can take Tylenol every 6 hours as needed for pain.  You can also take your prescribed tramadol at home.  I have refilled your prescription for Phenergan as needed.  Return to the ER if you have any new or concerning symptoms like fever greater than 100.4 F, trouble catching your breath, feeling as if you are going to pass out.

## 2020-10-02 ENCOUNTER — Emergency Department (HOSPITAL_BASED_OUTPATIENT_CLINIC_OR_DEPARTMENT_OTHER): Payer: Medicare Other

## 2020-10-02 ENCOUNTER — Encounter (HOSPITAL_BASED_OUTPATIENT_CLINIC_OR_DEPARTMENT_OTHER): Payer: Self-pay

## 2020-10-02 ENCOUNTER — Emergency Department (HOSPITAL_BASED_OUTPATIENT_CLINIC_OR_DEPARTMENT_OTHER)
Admission: EM | Admit: 2020-10-02 | Discharge: 2020-10-02 | Disposition: A | Discharge status: Home / Self Care | Payer: Medicare Other | Attending: Emergency Medicine | Admitting: Emergency Medicine

## 2020-10-02 ENCOUNTER — Other Ambulatory Visit: Payer: Self-pay

## 2020-10-02 DIAGNOSIS — Z79899 Other long term (current) drug therapy: Secondary | ICD-10-CM | POA: Insufficient documentation

## 2020-10-02 DIAGNOSIS — S99922A Unspecified injury of left foot, initial encounter: Secondary | ICD-10-CM | POA: Diagnosis not present

## 2020-10-02 DIAGNOSIS — T1490XA Injury, unspecified, initial encounter: Secondary | ICD-10-CM

## 2020-10-02 DIAGNOSIS — E039 Hypothyroidism, unspecified: Secondary | ICD-10-CM | POA: Insufficient documentation

## 2020-10-02 DIAGNOSIS — Z85850 Personal history of malignant neoplasm of thyroid: Secondary | ICD-10-CM | POA: Diagnosis not present

## 2020-10-02 DIAGNOSIS — W208XXA Other cause of strike by thrown, projected or falling object, initial encounter: Secondary | ICD-10-CM | POA: Insufficient documentation

## 2020-10-02 MED ORDER — IBUPROFEN 400 MG PO TABS: 600.0000 mg | ORAL_TABLET | Freq: Once | ORAL | Status: DC

## 2020-10-02 MED ORDER — KETOROLAC TROMETHAMINE 60 MG/2ML IM SOLN
15.0000 mg | Freq: Once | INTRAMUSCULAR | Status: AC
  Administered 2020-10-02: 15 mg via INTRAMUSCULAR
  Filled 2020-10-02: qty 2

## 2020-10-02 MED ORDER — IBUPROFEN 600 MG PO TABS: 600.0000 mg | ORAL_TABLET | Freq: Four times a day (QID) | ORAL | 0 refills | Status: DC | PRN

## 2020-10-02 NOTE — ED Triage Notes (Signed)
Pt states she dropped a metal thermos on left foot 2 days ago-NAD-to triage in w/c

## 2020-10-02 NOTE — ED Provider Notes (Addendum)
[facility] EMERGENCY DEPARTMENT Provider Note   CSN: [id number] Arrival date & time: 10/02/20  1718     History Chief Complaint  Patient presents with   Foot Injury    Ashlee Day is a 35 y.o. female.   Foot Injury     Ashlee Day is a 35 y.o. female, with a history of chronic pain, bipolar, hyperparathyroidism, fibromyalgia, presenting to the ED with left foot injury that occurred 3 days ago. Patient states she dropped a metal thermos on the foot.  She has been ambulating with a limp due to discomfort.  Pain and swelling has improved with elevation, ice, and ibuprofen.  Denies numbness, weakness, other injuries.    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[place])    Migraines    occasional aura   Partial edentulism    Pituitary adenoma ([place])    Polycystic disease, ovaries    PONV (postoperative nausea and vomiting)    Psychosis ([place])    ORGANIC PSYCHOSIS     Torticollis     Patient Active Problem List   Diagnosis Date Noted   Traumatic closed nondisplaced fracture of posterior malleolus 12/15/2016   Post-op pain 11/19/2016   Borderline personality disorder ([place]) 06/11/2012    Class: Chronic   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    Class: Chronic   Drug-seeking behavior 06/08/2012   Polypharmacy 05/25/2012   Major depressive disorder, recurrent episode, severe, specified as with psychotic behavior  05/23/2012   Unspecified episodic mood disorder 02/23/2012   Constipation 12/16/2011   Fibromyalgia    Depression    Ileus ([place])    Hashimoto's thyroiditis    Hyperparathyroidism, unspecified ([place])    Kidney stone    Bipolar disorder    GERD (gastroesophageal reflux disease)    Migraines    Partial edentulism     Past Surgical History:  Procedure Laterality Date   appendicitis     2003   DENTAL SURGERY     MULT   DENTAL SURGERY  11/19/2016   DILATION AND CURETTAGE OF UTERUS         1999    TOTAL 3   LAPAROSCOPY     ORIF ANKLE FRACTURE Right 12/15/2016   ORIF ANKLE FRACTURE Right 12/15/2016   Procedure: OPEN REDUCTION INTERNAL FIXATION (ORIF) ANKLE FRACTURE;  Surgeon: Hiram Gash, MD;  Location: [facility];  Service: Orthopedics;  Laterality: Right;   ORIF MANDIBULAR FRACTURE N/A 11/19/2016   Procedure: Extraction (teeth numbers seven, eight, nine); Alveoloplasty; Placement maxillary and mandibular implants;  Surgeon: Diona Browner, DDS;  Location: [facility];  Service: Oral Surgery;  Laterality: N/A;   PARATHYROIDECTOMY     SYNDESMOSIS REPAIR Right 12/15/2016   Procedure: SYNDESMOSIS REPAIR;  Surgeon: Hiram Gash, MD;  Location: [facility];  Service: Orthopedics;  Laterality: Right;     OB History   No obstetric history on file.     Family History  Problem Relation Age of Onset   Hypertension Father    Coronary artery disease Paternal Grandfather        smoker   Diabetes Paternal Grandfather        Medication induced   Cancer Maternal Grandfather        MM   Hypertension Maternal Grandmother    Migraines Mother    Rheum arthritis Mother        undifferentiated connective tissue    Social History   Tobacco Use   Smoking status: Never   Smokeless tobacco: Never  Vaping Use   Vaping Use: Never used  Substance Use Topics   Alcohol use: No   Drug use: No    Home Medications Prior to Admission medications   Medication Sig Start Date End Date Taking? Authorizing  Provider  albuterol (PROVENTIL HFA;VENTOLIN HFA) 108 (90 Base) MCG/ACT inhaler Inhale into the lungs every 6 (six) hours as needed for wheezing or shortness of breath.    [provider]  calcitRIOL (ROCALTROL) 0.5 MCG capsule Take 0.5 mcg by mouth daily.    [provider]  calcium carbonate (CALCI-CHEW) 1250 (500 Ca) MG chewable tablet Chew 1 tablet by mouth daily.    [provider]  cholecalciferol (VITAMIN D) 1000 UNITS tablet Take 1 tablet (1,000 Units total) by mouth daily. For nutritional supplement. 06/16/12   Ruben Im, PA-C  clonazePAM (KLONOPIN) 0.5 MG tablet Take 0.25 mg by mouth at bedtime as needed for anxiety.    [provider]  cloZAPine (CLOZARIL) 100 MG tablet Take 150-200 mg by mouth See admin instructions. 150 mg in the morning. 200 mg at 3:00 pm. 175 mg at bedtime    [provider]  enoxaparin (LOVENOX) 40 MG/0.4ML injection Inject 0.4 mLs (40 mg total) into the skin daily. 12/16/16   Hiram Gash, MD  labetalol (NORMODYNE) 100 MG tablet Take 100 mg by mouth 3 (three) times daily.  09/07/16   [provider]  Lactobacillus (ACIDOPHILUS PO) Take 1 capsule by mouth 3 (three) times daily.    [provider]  lamoTRIgine (LAMICTAL) 200 MG tablet Take 1 tablet (200 mg total) by mouth at bedtime. For mood lability. 07/26/12   Patrick Jupiter, PA-C  levothyroxine (SYNTHROID, LEVOTHROID) 112 MCG tablet Take 112 mcg by mouth daily before breakfast.     [provider]  Melatonin 3 MG TABS Take 9 mg by mouth at bedtime.    [provider]  metFORMIN (GLUCOPHAGE-XR) 500 MG 24 hr tablet Take 500 mg by mouth 4 (four) times daily.    [provider]  Norethindrone-Ethinyl Estradiol-Fe Biphas (LO LOESTRIN FE) 1 MG-10 MCG / 10 MCG tablet Take 1 tablet by mouth daily.    [provider]  OLANZapine (ZYPREXA) 2.5 MG tablet Take 2.5-10 mg by mouth daily as needed (for anxiety).     [provider]  ondansetron (ZOFRAN) 4 MG tablet Take 4 mg by mouth every 8 (eight) hours as needed for nausea or vomiting.    [provider]  oxyCODONE (OXY IR/ROXICODONE) 5 MG immediate release tablet Take 1 tablet (5 mg total) by mouth every 4 (four) hours as needed. Patient not taking: Reported on 12/15/2016 11/20/16   Diona Browner, DMD  polyethylene glycol (MIRALAX / GLYCOLAX) packet Take 17 g by mouth at bedtime.  [provider]  promethazine (PHENERGAN) 25 MG tablet Take 1 tablet (25 mg total) by mouth every 6 (six) hours as needed for nausea or vomiting. 11/27/17   Glyn Ade, PA-C  ranitidine (ZANTAC) 150 MG tablet Take 150 mg by mouth 2 (two) times daily.    [provider]  tiZANidine (ZANAFLEX) 4 MG tablet Take 4 mg by mouth every 6 (six) hours as needed (for neck pain).     [provider]  traMADol (ULTRAM) 50 MG tablet Take 50 mg by mouth 3 (three) times daily.    [provider]  venlafaxine XR (EFFEXOR-XR) 150 MG 24 hr capsule Take 150 mg by mouth daily with breakfast.    [provider]  vitamin B-12 (CYANOCOBALAMIN) 1000 MCG tablet Take 1,000 mcg by mouth daily.    [provider]  XTAMPZA ER 9 MG C12A Take 9 mg by mouth 2 (two) times daily. 11/07/16   [provider]    Allergies    Amoxicillin, Demerol [meperidine], Imitrex [sumatriptan], Other, Penicillins, Sulfa antibiotics, Depacon [valproic acid], Depakote [divalproex sodium], Dihydroergotamine, Metoclopramide, and Morphine and related  Review of Systems   Review of Systems  Musculoskeletal:  Positive for arthralgias.  Neurological:  Negative for weakness and numbness.   Physical Exam Updated Vital Signs BP (!) 124/106 (BP Location: Left Arm)   Pulse 98   Temp 97.8 F (36.6 C) (Oral)   Resp 18   Ht 5\' 4"  (1.626 m)   Wt 100.7 kg   LMP 09/18/2020 (Approximate)   SpO2 98%   BMI 38.11 kg/m   Physical Exam Vitals and nursing note  reviewed.  Constitutional:      General: She is not in acute distress.    Appearance: Normal appearance. She is well-developed. She is not diaphoretic.  HENT:     Head: Normocephalic and atraumatic.  Eyes:     Conjunctiva/sclera: Conjunctivae normal.  Cardiovascular:     Rate and Rhythm: Normal rate and regular rhythm.  Pulmonary:     Effort: Pulmonary effort is normal.  Musculoskeletal:     Cervical back: Neck supple.     Comments: Tenderness to the left dorsal foot without noted swelling, deformity, instability, color abnormality. No tenderness, pain, swelling, or other signs of injury to the rest of the foot, toes, ankle, or lower leg.  Skin:    General: Skin is warm and dry.     Capillary Refill: Capillary refill takes less than 2 seconds.     Coloration: Skin is not pale.  Neurological:     Mental Status: She is alert.     Comments: Sensation light touch grossly intact in the left foot and toes. Motor function intact in the left toes.  Psychiatric:        Behavior: Behavior normal.    ED Results / Procedures / Treatments   Labs (all labs ordered are listed, but only abnormal results are displayed) Labs Reviewed - No data to display  EKG None  Radiology DG Foot Complete Left  Result Date: 10/02/2020 CLINICAL DATA:  Dropped thermos on left foot EXAM: LEFT FOOT - COMPLETE 3+ VIEW COMPARISON:  None. FINDINGS: There is no evidence of fracture or dislocation. There is no evidence of arthropathy or other focal bone abnormality. Soft tissues are unremarkable. IMPRESSION: Negative. Electronically Signed   By: Donavan Foil M.D.   On: 10/02/2020 18:41    Procedures Procedures   Medications Ordered in ED Medications  ketorolac (TORADOL) injection 15 mg (  15 mg Intramuscular Given 10/02/20 1811)    ED Course  I have reviewed the triage vital signs and the nursing notes.  Pertinent labs & imaging results that were available during my care of the patient were reviewed by me  and considered in my medical decision making (see chart for details).    MDM Rules/Calculators/A&P                          Patient presents with a left foot injury that occurred a few days ago.  No evidence of neurovascular compromise. Imaging was reviewed without evidence of acute osseous abnormality. Patient has been ambulatory, she was placed in a boot for additional support. The patient was given instructions for home care as well as return precautions. Patient voices understanding of these instructions, accepts the plan, and is comfortable with discharge.    Final Clinical Impression(s) / ED Diagnoses Final diagnoses:  Injury  Injury of left foot, initial encounter    Rx / DC Orders ED Discharge Orders     None        Layla Maw 10/02/20 1901    Lorayne Bender, PA-C 10/02/20 1902    Lennice Sites, DO 10/02/20 2323

## 2020-10-02 NOTE — Discharge Instructions (Addendum)
You have been seen today for a foot injury. There were no acute abnormalities on the x-rays, including no sign of fracture or dislocation, however, there could be injuries to the soft tissues, such as the ligaments or tendons that are not seen on xrays. There could also be what are called occult fractures that are small fractures not seen on xray. Antiinflammatory medications: Take 600 mg of ibuprofen every 6 hours or 440 mg (over the counter dose) to 500 mg (prescription dose) of naproxen every 12 hours for the next 3 days. After this time, these medications may be used as needed for pain. Take these medications with food to avoid upset stomach. Choose only one of these medications, do not take them together. Acetaminophen (generic for Tylenol): Should you continue to have additional pain while taking the ibuprofen or naproxen, you may add in acetaminophen as needed. Your daily total maximum amount of acetaminophen from all sources should be limited to 4000mg /day for persons without liver problems, or 2000mg /day for those with liver problems. Ice: May apply ice to the area over the next 24 hours for 15 minutes at a time to reduce swelling. Elevation: Keep the extremity elevated as often as possible to reduce pain and inflammation. Support: Wear the boot for support and comfort. Wear this until pain resolves. You will be weight-bearing as tolerated, which means you can slowly start to put weight on the extremity and increase amount and frequency as pain allows. Follow up: If symptoms are improving, you may follow up with your primary care provider for any continued management. If symptoms are not starting to improve within a week, you should follow up with the orthopedic specialist within two weeks. Return: Return to the ED for numbness, weakness, increasing pain, overall worsening symptoms, loss of function, or if symptoms are not improving, you have tried to follow up with the orthopedic specialist, and  have been unable to do so.  For prescription assistance, may try using prescription discount sites or apps, such as goodrx.com or Good Rx smart phone app.

## 2021-01-02 ENCOUNTER — Ambulatory Visit: Admit: 2021-01-02 | Payer: Medicare Other | Admitting: Oral Surgery

## 2021-01-02 SURGERY — DENTAL RESTORATION/EXTRACTIONS
Anesthesia: General

## 2021-02-06 ENCOUNTER — Encounter (HOSPITAL_COMMUNITY): Admission: RE | Payer: Self-pay | Source: Home / Self Care

## 2021-02-06 ENCOUNTER — Ambulatory Visit (HOSPITAL_COMMUNITY): Admission: RE | Admit: 2021-02-06 | Payer: Medicaid Other | Source: Home / Self Care | Admitting: Oral Surgery

## 2021-02-06 SURGERY — DENTAL RESTORATION/EXTRACTIONS
Anesthesia: General

## 2021-07-12 ENCOUNTER — Encounter (HOSPITAL_BASED_OUTPATIENT_CLINIC_OR_DEPARTMENT_OTHER): Payer: Self-pay | Admitting: Emergency Medicine

## 2021-07-12 ENCOUNTER — Other Ambulatory Visit: Payer: Self-pay

## 2021-07-12 ENCOUNTER — Emergency Department (HOSPITAL_BASED_OUTPATIENT_CLINIC_OR_DEPARTMENT_OTHER)
Admission: EM | Admit: 2021-07-12 | Discharge: 2021-07-12 | Disposition: A | Discharge status: Home / Self Care | Payer: Medicare Other | Attending: Emergency Medicine | Admitting: Emergency Medicine

## 2021-07-12 DIAGNOSIS — I10 Essential (primary) hypertension: Secondary | ICD-10-CM | POA: Insufficient documentation

## 2021-07-12 DIAGNOSIS — Z85850 Personal history of malignant neoplasm of thyroid: Secondary | ICD-10-CM | POA: Diagnosis not present

## 2021-07-12 DIAGNOSIS — R300 Dysuria: Secondary | ICD-10-CM | POA: Insufficient documentation

## 2021-07-12 DIAGNOSIS — R110 Nausea: Secondary | ICD-10-CM | POA: Insufficient documentation

## 2021-07-12 DIAGNOSIS — R310 Gross hematuria: Secondary | ICD-10-CM | POA: Insufficient documentation

## 2021-07-12 DIAGNOSIS — Z79899 Other long term (current) drug therapy: Secondary | ICD-10-CM | POA: Insufficient documentation

## 2021-07-12 DIAGNOSIS — E039 Hypothyroidism, unspecified: Secondary | ICD-10-CM | POA: Insufficient documentation

## 2021-07-12 HISTORY — DX: Essential (primary) hypertension: I10

## 2021-07-12 LAB — URINALYSIS, MICROSCOPIC (REFLEX)

## 2021-07-12 LAB — URINALYSIS, ROUTINE W REFLEX MICROSCOPIC
Bilirubin Urine: NEGATIVE
Glucose, UA: 100 mg/dL — AB
Hgb urine dipstick: NEGATIVE
Ketones, ur: NEGATIVE mg/dL
Leukocytes,Ua: NEGATIVE
Nitrite: POSITIVE — AB
Protein, ur: NEGATIVE mg/dL
Specific Gravity, Urine: 1.015 (ref 1.005–1.030)
pH: 5.5 (ref 5.0–8.0)

## 2021-07-12 LAB — PREGNANCY, URINE: Preg Test, Ur: NEGATIVE

## 2021-07-12 MED ORDER — SULFAMETHOXAZOLE-TRIMETHOPRIM 800-160 MG PO TABS: 1.0000 | ORAL_TABLET | Freq: Two times a day (BID) | ORAL | 0 refills | Status: AC

## 2021-07-12 MED ORDER — ONDANSETRON HCL 4 MG PO TABS: 4.0000 mg | ORAL_TABLET | Freq: Three times a day (TID) | ORAL | 0 refills | Status: AC | PRN

## 2021-07-12 MED ORDER — KETOROLAC TROMETHAMINE 30 MG/ML IJ SOLN
30.0000 mg | Freq: Once | INTRAMUSCULAR | Status: AC
  Administered 2021-07-12: 30 mg via INTRAMUSCULAR
  Filled 2021-07-12: qty 1

## 2021-07-12 MED ORDER — PHENAZOPYRIDINE HCL 200 MG PO TABS: 200.0000 mg | ORAL_TABLET | Freq: Three times a day (TID) | ORAL | 0 refills | Status: AC

## 2021-07-12 MED ORDER — ONDANSETRON 4 MG PO TBDP
8.0000 mg | ORAL_TABLET | Freq: Once | ORAL | Status: AC
  Administered 2021-07-12: 8 mg via ORAL
  Filled 2021-07-12: qty 2

## 2021-07-12 NOTE — ED Triage Notes (Signed)
Pt arrives pov, steady gait to triage, c/o dysuria, lower abdominal pain and lower back, and coccyx pain x 3 days. Denies fever, endorses Azo x 2 days with no relief ?

## 2021-07-12 NOTE — Discharge Instructions (Signed)
Please pick up medications from pharmacy. Please f/u with PCP regarding recurrent symptoms ?

## 2021-07-12 NOTE — ED Provider Notes (Signed)
?[facility] EMERGENCY DEPARTMENT ?Provider Note ? ? ?CSN: [id number] ?Arrival date & time: 07/12/21  1426 ? ?  ? ?History ?PMH: Fibromyalgia, GERD, MEN 1, Hypothyroidism, Bipolar, Migraines, hx of nephrolithiasis, thyroid cancer, HTN, appendectomy ?Chief Complaint  ?Patient presents with  ? Dysuria  ? ? ?Ashlee Day is a 36 y.o. female. ?Presents the ED with complaints of dysuria for 3 days.  She states that over the past day she started to develop bilateral flank pain.  She has had multiple UTIs in the past and says this feels the exact same.  She also noticed some blood in her urine.  She is taken Azo x2 with some relief.  He has associated nausea with no vomiting.  She does have history of kidney stones, however the pain has not consistent with this.  She has not had any fevers, chills, diarrhea, vaginal discharge, vaginal bleeding, abdominal pain.  She recently just got off her period. ? ? ? ?Dysuria ?Associated symptoms: flank pain and nausea   ?Associated symptoms: no abdominal pain, no fever and no vomiting   ? ?  ? ?Home Medications ?Prior to Admission medications   ?Medication Sig Start Date End Date Taking? Authorizing Provider  ?ondansetron (ZOFRAN) 4 MG tablet Take 1 tablet (4 mg total) by mouth every 8 (eight) hours as needed for up to 3 days for nausea or vomiting. 07/12/21 07/15/21 Yes Rhoda Waldvogel, Adora Fridge, PA-C  ?phenazopyridine (PYRIDIUM) 200 MG tablet Take 1 tablet (200 mg total) by mouth 3 (three) times daily for 2 days. 07/12/21 07/14/21 Yes Eulon Allnutt, Adora Fridge, PA-C  ?sulfamethoxazole-trimethoprim (BACTRIM DS) 800-160 MG tablet Take 1 tablet by mouth 2 (two) times daily for 3 days. 07/12/21 07/15/21 Yes Jetaime Pinnix, Adora Fridge, PA-C  ?albuterol (PROVENTIL HFA;VENTOLIN HFA) 108 (90 Base) MCG/ACT inhaler Inhale into the lungs every 6 (six) hours as needed for wheezing or shortness of breath.    [provider]  ?calcitRIOL (ROCALTROL) 0.5 MCG capsule Take 0.5 mcg by mouth daily.    [provider]  ?calcium carbonate (CALCI-CHEW) 1250 (500 Ca) MG chewable tablet Chew 1 tablet by mouth daily.    [provider]  ?cholecalciferol (VITAMIN D) 1000 UNITS tablet Take 1 tablet (1,000 Units total) by mouth daily. For nutritional supplement. 06/16/12   Ruben Im, PA-C  ?clonazePAM (KLONOPIN) 0.5 MG tablet Take 0.25 mg by mouth at bedtime as needed for anxiety.    [provider]  ?cloZAPine (CLOZARIL) 100 MG tablet Take 150-200 mg by mouth See admin instructions. 150 mg in the morning. 200 mg at 3:00 pm. 175 mg at bedtime    [provider]  ?enoxaparin (LOVENOX) 40 MG/0.4ML injection Inject 0.4 mLs (40 mg total) into the skin daily. 12/16/16   Hiram Gash, MD  ?ibuprofen (ADVIL) 600 MG tablet Take 1 tablet (600 mg total) by mouth every 6 (six) hours as needed. 10/02/20   Joy, Shawn C, PA-C  ?labetalol (NORMODYNE) 100 MG tablet Take 100 mg by mouth 3 (three) times daily.  09/07/16   [provider]  ?Lactobacillus (ACIDOPHILUS PO) Take 1 capsule by mouth 3 (three) times daily.    [provider]  ?lamoTRIgine (LAMICTAL) 200 MG tablet Take 1 tablet (200 mg total) by mouth at bedtime. For mood lability. 07/26/12   Patrick Jupiter, PA-C  ?levothyroxine (SYNTHROID, LEVOTHROID) 112 MCG tablet Take 112 mcg by mouth daily before breakfast.     [provider]  ?Melatonin 3 MG TABS Take  9 mg by mouth at bedtime.    [provider]  ?metFORMIN (GLUCOPHAGE-XR) 500 MG 24 hr tablet Take 500 mg by mouth 4 (four) times daily.    [provider]  ?Norethindrone-Ethinyl Estradiol-Fe Biphas (LO LOESTRIN FE) 1 MG-10 MCG / 10 MCG tablet Take 1 tablet by mouth daily.    [provider]  ?OLANZapine (ZYPREXA) 2.5 MG tablet Take 2.5-10 mg by mouth daily as needed (for anxiety).     [provider]  ?oxyCODONE (OXY IR/ROXICODONE) 5 MG immediate release tablet Take 1 tablet (5 mg total) by mouth every 4 (four) hours as needed. ?Patient  not taking: Reported on 12/15/2016 11/20/16   Diona Browner, DMD  ?polyethylene glycol (MIRALAX / GLYCOLAX) packet Take 17 g by mouth at bedtime.     [provider]  ?promethazine (PHENERGAN) 25 MG tablet Take 1 tablet (25 mg total) by mouth every 6 (six) hours as needed for nausea or vomiting. 11/27/17   Glyn Ade, PA-C  ?ranitidine (ZANTAC) 150 MG tablet Take 150 mg by mouth 2 (two) times daily.    [provider]  ?tiZANidine (ZANAFLEX) 4 MG tablet Take 4 mg by mouth every 6 (six) hours as needed (for neck pain).     [provider]  ?traMADol (ULTRAM) 50 MG tablet Take 50 mg by mouth 3 (three) times daily.    [provider]  ?venlafaxine XR (EFFEXOR-XR) 150 MG 24 hr capsule Take 150 mg by mouth daily with breakfast.    [provider]  ?vitamin B-12 (CYANOCOBALAMIN) 1000 MCG tablet Take 1,000 mcg by mouth daily.    [provider]  ?XTAMPZA ER 9 MG C12A Take 9 mg by mouth 2 (two) times daily. 11/07/16   [provider]  ?   ? ?Allergies    ?Amoxicillin, Demerol [meperidine], Imitrex [sumatriptan], Other, Penicillins, Sulfa antibiotics, Depacon [valproic acid], Depakote [divalproex sodium], Dihydroergotamine, Metoclopramide, and Morphine and related   ? ?Review of Systems   ?Review of Systems  ?Constitutional:  Negative for fever.  ?Gastrointestinal:  Positive for nausea. Negative for abdominal pain and vomiting.  ?Genitourinary:  Positive for dysuria, flank pain and hematuria. Negative for pelvic pain.  ?All other systems reviewed and are negative. ? ?Physical Exam ?Updated Vital Signs ?BP (!) 131/96 (BP Location: Left Arm)   Pulse 93   Temp 98.4 ?F (36.9 ?C)   Resp 18   Ht '5\' 3"'$  (1.6 m)   Wt 104.3 kg   LMP 07/02/2021   SpO2 98%   BMI 40.74 kg/m?  ?Physical Exam ?Vitals and nursing note reviewed.  ?Constitutional:   ?   General: She is not in acute distress. ?   Appearance: Normal appearance. She is well-developed. She is not  ill-appearing, toxic-appearing or diaphoretic.  ?HENT:  ?   Head: Normocephalic and atraumatic.  ?   Nose: No nasal deformity.  ?   Mouth/Throat:  ?   Lips: Pink. No lesions.  ?Eyes:  ?   General: Gaze aligned appropriately. No scleral icterus.    ?   Right eye: No discharge.     ?   Left eye: No discharge.  ?   Conjunctiva/sclera: Conjunctivae normal.  ?   Right eye: Right conjunctiva is not injected. No exudate or hemorrhage. ?   Left eye: Left conjunctiva is not injected. No exudate or hemorrhage. ?Pulmonary:  ?   Effort: Pulmonary effort is normal. No respiratory distress.  ?Abdominal:  ?   General: Abdomen is  flat.  ?   Palpations: Abdomen is soft.  ?   Tenderness: There is no abdominal tenderness. There is no right CVA tenderness, left CVA tenderness, guarding or rebound.  ?Skin: ?   General: Skin is warm and dry.  ?Neurological:  ?   Mental Status: She is alert and oriented to person, place, and time.  ?Psychiatric:     ?   Mood and Affect: Mood normal.     ?   Speech: Speech normal.     ?   Behavior: Behavior normal. Behavior is cooperative.  ? ? ?ED Results / Procedures / Treatments   ?Labs ?(all labs ordered are listed, but only abnormal results are displayed) ?Labs Reviewed  ?URINALYSIS, ROUTINE W REFLEX MICROSCOPIC - Abnormal; Notable for the following components:  ?    Result Value  ? Color, Urine ORANGE (*)   ? Glucose, UA 100 (*)   ? Nitrite POSITIVE (*)   ? All other components within normal limits  ?URINALYSIS, MICROSCOPIC (REFLEX) - Abnormal; Notable for the following components:  ? Bacteria, UA FEW (*)   ? All other components within normal limits  ?URINE CULTURE  ?PREGNANCY, URINE  ? ? ?EKG ?None ? ?Radiology ?No results found. ? ?Procedures ?Procedures  ? ? ?Medications Ordered in ED ?Medications  ?ondansetron (ZOFRAN-ODT) disintegrating tablet 8 mg (8 mg Oral Given 07/12/21 1518)  ?ketorolac (TORADOL) 30 MG/ML injection 30 mg (30 mg Intramuscular Given 07/12/21 1518)  ? ? ?ED Course/ Medical  Decision Making/ A&P ?  ?                        ?Medical Decision Making ?Amount and/or Complexity of Data Reviewed ?Labs: ordered. ? ?Risk ?Prescription drug management. ? ? ? ?MDM  ?This is a 36 y.o. female who presents to the

## 2021-07-20 NOTE — H&P (Signed)
HISTORY AND PHYSICAL ? ?Ashlee Day is a 36 y.o. female patient with CC: ? ?No diagnosis found. ? ?Past Medical History:  ?Diagnosis Date  ? Ankle fracture   ? right  ? Bipolar disorder ([place])   ? Cancer [facility])   ? THYROID CANCER  ? Chronic pain disorder   ? Depression   ? Dysrhythmia   ? SURGES TACHYCARDIA    ? Family history of adverse reaction to anesthesia   ? mother also has nausea   ? Fibromyalgia   ? GERD (gastroesophageal reflux disease)   ? Hashimoto's thyroiditis   ? [id number]  ? History of kidney stones   ? [id number]  ? Hyperparathyroidism ([place])   ? Hyperparathyroidism, unspecified ([place])   ? Hypertension   ? Hypothyroidism   ? Ileus ([place])   ? x3-4  ? 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 Migraines   ? occasional aura  ? Partial edentulism   ? Pituitary adenoma ([place])   ? Polycystic disease, ovaries   ? PONV (postoperative nausea and vomiting)   ? Psychosis ([place])   ? ORGANIC PSYCHOSIS    ? Torticollis   ? ? ?No current facility-administered medications for this encounter.  ? ?Current Outpatient Medications  ?Medication Sig Dispense Refill  ? albuterol (PROVENTIL HFA;VENTOLIN HFA) 108 (90 Base) MCG/ACT inhaler Inhale 2 puffs into the lungs every 6 (six) hours as needed for wheezing or shortness of breath.    ? calcium carbonate (OS-CAL) 600 MG TABS tablet Take 2 tablets by mouth daily.    ? cetirizine (ZYRTEC) 10 MG tablet Take 10 mg by mouth daily as needed for allergies.    ? cholecalciferol (VITAMIN D) 1000 UNITS tablet Take 1 tablet (1,000 Units total) by mouth daily. For nutritional supplement.    ? clonazePAM (KLONOPIN) 0.5 MG tablet Take 0.25 mg by mouth at bedtime as needed (sleep).    ? cloZAPine (CLOZARIL) 100 MG tablet Take 150-200 mg by mouth See admin instructions. Take 150 mg in the morning, take with 25 mg  for a total of 175 mg in the morning, 200 mg  in the afternoon & 150 mg bedtime    ? cloZAPine (CLOZARIL) 25 MG tablet Take 25 mg by mouth in the morning.    ? cyclobenzaprine  (FLEXERIL) 10 MG tablet Take 10 mg by mouth 3 (three) times daily as needed for muscle spasms.    ? diclofenac Sodium (VOLTAREN) 1 % GEL Apply 1 application. topically 2 (two) times daily.    ? dicyclomine (BENTYL) 10 MG capsule Take 10 mg by mouth daily as needed for spasms.    ? fluticasone (FLONASE) 50 MCG/ACT nasal spray Place 1 spray into both nostrils daily as needed for allergies or rhinitis.    ? HYDROcodone-acetaminophen (NORCO) 7.5-325 MG tablet Take 1 tablet by mouth 2 (two) times daily as needed for pain.    ? ibuprofen (ADVIL) 600 MG tablet Take 1 tablet (600 mg total) by mouth every 6 (six) hours as needed. (Patient taking differently: Take 600 mg by mouth every 6 (six) hours as needed for moderate pain or mild pain.) 30 tablet 0  ? lamoTRIgine (LAMICTAL) 200 MG tablet Take 1 tablet (200 mg total) by mouth at bedtime. For mood lability. 60 tablet 1  ? levothyroxine (SYNTHROID, LEVOTHROID) 112 MCG tablet Take 112 mcg by mouth daily before breakfast.     ? lidocaine (XYLOCAINE) 5 % ointment Apply 1 application. topically 2 (two) times a week.    ?  metFORMIN (GLUCOPHAGE-XR) 500 MG 24 hr tablet Take 1,000 mg by mouth 2 (two) times daily.    ? metoprolol succinate (TOPROL-XL) 100 MG 24 hr tablet Take 100 mg by mouth at bedtime.    ? Norethindrone-Ethinyl Estradiol-Fe Biphas (LO LOESTRIN FE) 1 MG-10 MCG / 10 MCG tablet Take 1 tablet by mouth daily.    ? OLANZapine (ZYPREXA) 2.5 MG tablet Take 2.5 mg by mouth 4 (four) times daily.    ? ondansetron (ZOFRAN) 4 MG tablet Take 4 mg by mouth every 8 (eight) hours as needed for nausea/vomiting.    ? polyethylene glycol (MIRALAX / GLYCOLAX) packet Take 17 g by mouth daily as needed for mild constipation or moderate constipation.    ? promethazine (PHENERGAN) 25 MG tablet Take 1 tablet (25 mg total) by mouth every 6 (six) hours as needed for nausea or vomiting. 30 tablet 0  ? venlafaxine XR (EFFEXOR-XR) 150 MG 24 hr capsule Take 150 mg by mouth daily with breakfast.     ? vitamin B-12 (CYANOCOBALAMIN) 1000 MCG tablet Take 1,000 mcg by mouth daily.    ? XTAMPZA ER 9 MG C12A Take 9 mg by mouth every 12 (twelve) hours.  0  ? pregabalin (LYRICA) 75 MG capsule Take 75 mg by mouth 2 (two) times daily.    ? ?Allergies  ?Allergen Reactions  ? Enoxaparin Nausea Only  ? Paroxetine Hcl   ?  Other reaction(s): Other ?Hallucinations and agitation  ? Amoxicillin Other (See Comments)  ?  Blisters all over ?Cannot take any 'cillins' at all  ? Demerol [Meperidine] Nausea And Vomiting and Other (See Comments)  ?  Can not tolerate Iv administration ?Can tolerate IM adminstration  ? Haldol [Haloperidol] Other (See Comments)  ?  Reverse effect  ? Imitrex [Sumatriptan] Other (See Comments)  ?  Severity unknown  ? Macrobid [Nitrofurantoin] Nausea And Vomiting  ? Other Itching and Other (See Comments)  ?  Pink NG tape  ? Penicillins Other (See Comments)  ?  Cannot not tolerate any cillins due to amoxicillin reaction ?  ? Buprenorphine Rash  ? Depacon [Valproic Acid] Nausea And Vomiting and Rash  ? Depakote [Divalproex Sodium] Nausea And Vomiting and Rash  ? Dihydroergotamine Nausea And Vomiting and Rash  ? Metoclopramide Nausea And Vomiting, Rash and Other (See Comments)  ?  React to IV form ?Can tolerate oral medication  ? Morphine And Related Rash and Other (See Comments)  ?  IV form  ? ?Active Problems: ?  * No active hospital problems. * ? ?Vitals: Last menstrual period 07/02/2021. ?Lab results:No results found for this or any previous visit (from the past 60 hour(s)). ?Radiology Results: No results found. ?General appearance: alert, cooperative, no distress, and morbidly obese ?Head: Normocephalic, without obvious abnormality, atraumatic ?Eyes: negative ?Nose: Nares normal. Septum midline. Mucosa normal. No drainage or sinus tenderness. ?Throat: Edentulous maxilla and mandible. No purulence, edema, fluctuance. Pharynx clear. ?Neck: no adenopathy ?Resp: clear to auscultation bilaterally ?Cardio:  regular rate and rhythm, S1, S2 normal, no murmur, click, rub or gallop ? ?Assessment: Failed dental implant right mandible. Maxillary and mandibular atrophy. ? ?Plan:Remove failed implant right mandible. Placement implants maxilla and mandible with bone grafts, resorbable barrier membranes. GA. 23 hour obs. ? ? ?Diona Browner ?07/20/2021   ?

## 2021-07-21 ENCOUNTER — Other Ambulatory Visit: Payer: Self-pay

## 2021-07-21 ENCOUNTER — Encounter (HOSPITAL_COMMUNITY): Payer: Self-pay | Admitting: Oral Surgery

## 2021-07-21 NOTE — Progress Notes (Signed)
PCP -  ?Cardiologist - Dr. Gerarda Gunther [facility])  ?EKG - DOS - requested documents of EKG in C.E.  ?Chest x-ray -  ?ECHO -  ?Cardiac Cath -  ?CPAP -  ? ?ERAS Protcol -  ?COVID TEST- n/a ? ?Anesthesia review: yes  ? ?------------- ? ?SDW INSTRUCTIONS: ? ?Your procedure is scheduled on Thursday 4/20. Please report to [facility] Main Entrance "A" at 0730 A.M., and check in at the Admitting office. Call this number if you have problems the morning of surgery: [phone] ? ? ?Remember: Do not eat or drink after midnight the night before your surgery ?  ?Medications to take morning of surgery with a sip of water include: ?albuterol (PROVENTIL HFA;VENTOLIN HFA)  ?cetirizine (ZYRTEC) ?cloZAPine (CLOZARIL)  ?cyclobenzaprine (FLEXERIL)  ?HYDROcodone-acetaminophen ([place])  ?levothyroxine (SYNTHROID, LEVOTHROID)  ?OLANZapine (ZYPREXA)  ?ondansetron [facility])  ?pregabalin (LYRICA)  ?venlafaxine XR (EFFEXOR-XR)  ? ? ?As of today, STOP taking any Aspirin (unless otherwise instructed by your surgeon), Aleve, Naproxen, Ibuprofen, Motrin, Advil, Goody's, BC's, all herbal medications, fish oil, and all vitamins. ? ?  ?The Morning of Surgery ?Do not wear jewelry, make-up or nail polish. ?Do not wear lotions, powders, or perfumes, or deodorant ?Do not bring valuables to the hospital. ?[facility] is not responsible for any belongings or valuables. ? ?If you are a smoker, DO NOT Smoke 24 hours prior to surgery ? ?If you wear a CPAP at night please bring your mask the morning of surgery  ? ?Remember that you must have someone to transport you home after your surgery, and remain with you for 24 hours if you are discharged the same day. ? ?Please bring cases for contacts, glasses, hearing aids, dentures or bridgework because it cannot be worn into surgery.  ? ?Patients discharged the day of surgery will not be allowed to drive home.  ? ?Please shower the NIGHT BEFORE/MORNING OF SURGERY (use antibacterial soap like DIAL soap if  possible). Wear comfortable clothes the morning of surgery. Oral Hygiene is also important to reduce your risk of infection.  Remember - BRUSH YOUR TEETH THE MORNING OF SURGERY WITH YOUR REGULAR TOOTHPASTE ? ?Patient denies shortness of breath, fever, cough and chest pain.  ? ? ?   ? ?

## 2021-07-22 NOTE — Progress Notes (Signed)
Anesthesia Chart Review: ?Same day workup ? ?Pertinent history includes MEN 1 syndrome, morbid obesity, thyroid cancer status post surgery status post parathyroidectomy, tachycardia and hypertension. ? ?She is followed by cardiologist Dr. Gerarda Gunther at [facility] for history of tachycardia and hypertension.  Last seen 07/30/2020.  Per note, she was doing very well at that time without cardiac complaints.  Echo 03/28/2019 showed normal LV systolic function.  She was advised to continue current medical management and follow-up in 1 year. ? ?CBC 07/20/2021 in Care Everywhere reviewed, unremarkable. ? ?Patient will need day of surgery labs and evaluation. ? ?EKG 07/30/2020 (Care Everywhere): Sinus tachycardia.  Right axis-may be normal for age.  Negative precordial T waves. ? ?Echo 03/28/2019 (Care Everywhere): ?Left Ventricle: The calculated left ventricular ejection fraction is  ?62%. Systolic function is normal. EF: 60-65%. Wall motion is within normal  ?limits. Diastolic function and filling pressure is normal. ? ? ? ?Karoline Caldwell, PA-C ?[facility]/Anesthesiology ?Phone [phone] ?07/22/2021 11:34 AM ? ?

## 2021-07-22 NOTE — Anesthesia Preprocedure Evaluation (Addendum)
Anesthesia Evaluation  ?Patient identified by MRN, date of birth, ID band ?Patient awake ? ? ? ?Reviewed: ?Allergy & Precautions, NPO status , Patient's Chart, lab work & pertinent test results, reviewed documented beta blocker date and time  ? ?History of Anesthesia Complications ?(+) PONV, Family history of anesthesia reaction and history of anesthetic complications ? ?Airway ?Mallampati: III ? ? ?Neck ROM: Full ? ?Mouth opening: Limited Mouth Opening ? Dental ? ?(+) Edentulous Upper, Edentulous Lower ?  ?Pulmonary ?neg pulmonary ROS,  ?  ?Pulmonary exam normal ?breath sounds clear to auscultation ? ? ? ? ? ? Cardiovascular ?hypertension, Pt. on home beta blockers and Pt. on medications ?Normal cardiovascular exam+ dysrhythmias  ?Rhythm:Regular Rate:Normal ? ? ?  ?Neuro/Psych ? Headaches, PSYCHIATRIC DISORDERS Depression Bipolar Disorder  Neuromuscular disease   ? GI/Hepatic ?Neg liver ROS, GERD  ,  ?Endo/Other  ?Hypothyroidism Morbid obesity ? Renal/GU ?Renal disease  ? ?  ?Musculoskeletal ? ?(+) Fibromyalgia - ? Abdominal ?(+) + obese,   ?Peds ? Hematology ?negative hematology ROS ?(+)   ?Anesthesia Other Findings ? ? Reproductive/Obstetrics ? ?  ? ? ? ? ? ? ? ? ? ? ? ? ? ?  ?  ? ? ? ? ?                                  Anesthesia Evaluation  ?Patient identified by MRN, date of birth, ID band ?Patient awake ? ? ? ?Reviewed: ?Allergy & Precautions, NPO status , Patient's Chart, lab work & pertinent test results ? ?History of Anesthesia Complications ?(+) PONV ? ?Airway ?Mallampati: III ? ?TM Distance: <3 FB ?Neck ROM: Full ? ? ? Dental ?no notable dental hx. ? ?  ?Pulmonary ? ?  ?breath sounds clear to auscultation ? ? ? ? ? ? Cardiovascular ?+ dysrhythmias  ?Rhythm:Regular Rate:Normal ? ? ?  ?Neuro/Psych ? Headaches, Bipolar Disorder Schizophrenia   ? GI/Hepatic ?GERD  ,  ?Endo/Other  ?Hypothyroidism  ? Renal/GU ?Renal disease  ? ?  ?Musculoskeletal ? ?(+) Fibromyalgia - ?  Abdominal ?  ?Peds ? Hematology ?  ?Anesthesia Other Findings ? ? Reproductive/Obstetrics ? ?  ? ? ? ? ? ? ? ? ? ? ? ? ? ?  ?  ? ? ? ? ? ? ? ?Anesthesia Physical ?Anesthesia Plan ? ?ASA: III ? ?Anesthesia Plan: General  ? ?Post-op Pain Management:   ? ?Induction: Intravenous ? ?PONV Risk Score and Plan: 4 or greater and Ondansetron, Dexamethasone, Midazolam, Scopolamine patch - Pre-op and Treatment may vary due to age or medical condition ? ?Airway Management Planned: Oral ETT ? ?Additional Equipment:  ? ?Intra-op Plan:  ? ?Post-operative Plan: Extubation in OR ? ?Informed Consent: I have reviewed the patients History and Physical, chart, labs and discussed the procedure including the risks, benefits and alternatives for the proposed anesthesia with the patient or authorized representative who has indicated his/her understanding and acceptance.  ? ?Dental advisory given ? ?Plan Discussed with: CRNA ? ?Anesthesia Plan Comments:   ? ? ? ? ? ? ?Anesthesia Quick Evaluation ? ?Anesthesia Physical ?Anesthesia Plan ? ?ASA: 3 ? ?Anesthesia Plan: General  ? ?Post-op Pain Management: Ketamine IV* and Tylenol PO (pre-op)*  ? ?Induction: Intravenous ? ?PONV Risk Score and Plan: 4 or greater and Ondansetron, Dexamethasone, Treatment may vary due to age or medical condition and Midazolam ? ?Airway Management Planned: Nasal ETT and Video Laryngoscope Planned ? ?  Additional Equipment:  ? ?Intra-op Plan:  ? ?Post-operative Plan: Extubation in OR ? ?Informed Consent: I have reviewed the patients History and Physical, chart, labs and discussed the procedure including the risks, benefits and alternatives for the proposed anesthesia with the patient or authorized representative who has indicated his/her understanding and acceptance.  ? ? ? ?Dental advisory given ? ?Plan Discussed with: CRNA ? ?Anesthesia Plan Comments: (PAT note by Karoline Caldwell, PA-C: ?Pertinent history includes MEN 1 syndrome, morbid obesity, thyroid cancer status post  surgery status post parathyroidectomy, tachycardia and hypertension. ? ?She is followed by cardiologist Dr. Gerarda Gunther at [facility] for history of tachycardia and hypertension.  Last seen 07/30/2020.  Per note, she was doing very well at that time without cardiac complaints.  Echo 03/28/2019 showed normal LV systolic function.  She was advised to continue current medical management and follow-up in 1 year. ? ?CBC 07/20/2021 in Care Everywhere reviewed, unremarkable. ? ?Patient will need day of surgery labs and evaluation. ? ?EKG 07/30/2020 (Care Everywhere): Sinus tachycardia.  Right axis-may be normal for age.  Negative precordial T waves. ? ?Echo 03/28/2019 (Care Everywhere): ?Left Ventricle: The calculated left ventricular ejection fraction is  ?62%. Systolic function is normal. EF: 60-65%. Wall motion is within normal  ?limits. Diastolic function and filling pressure is normal. ? ?)  ? ? ? ?Anesthesia Quick Evaluation ? ?

## 2021-07-22 NOTE — Progress Notes (Signed)
Mother aware of surgery time change, new arrival 50 ?

## 2021-07-23 ENCOUNTER — Observation Stay (HOSPITAL_COMMUNITY)
Admission: RE | Admit: 2021-07-23 | Discharge: 2021-07-24 | Disposition: A | Discharge status: Home / Self Care | Payer: Medicare Other | Attending: Oral Surgery | Admitting: Oral Surgery

## 2021-07-23 ENCOUNTER — Encounter (HOSPITAL_COMMUNITY): Payer: Self-pay | Admitting: Oral Surgery

## 2021-07-23 ENCOUNTER — Other Ambulatory Visit: Payer: Self-pay

## 2021-07-23 ENCOUNTER — Encounter (HOSPITAL_COMMUNITY)
Admission: RE | Disposition: A | Discharge status: Home / Self Care | Payer: Self-pay | Source: Home / Self Care | Attending: Oral Surgery

## 2021-07-23 ENCOUNTER — Ambulatory Visit (HOSPITAL_BASED_OUTPATIENT_CLINIC_OR_DEPARTMENT_OTHER): Payer: Medicare Other | Admitting: Vascular Surgery

## 2021-07-23 ENCOUNTER — Ambulatory Visit (HOSPITAL_COMMUNITY): Payer: Medicare Other | Admitting: Vascular Surgery

## 2021-07-23 DIAGNOSIS — K0820 Unspecified atrophy of edentulous alveolar ridge: Secondary | ICD-10-CM | POA: Diagnosis not present

## 2021-07-23 DIAGNOSIS — M2689 Other dentofacial anomalies: Secondary | ICD-10-CM | POA: Diagnosis not present

## 2021-07-23 DIAGNOSIS — I10 Essential (primary) hypertension: Secondary | ICD-10-CM | POA: Diagnosis not present

## 2021-07-23 DIAGNOSIS — M797 Fibromyalgia: Secondary | ICD-10-CM | POA: Diagnosis not present

## 2021-07-23 DIAGNOSIS — M2761 Osseointegration failure of dental implant: Secondary | ICD-10-CM | POA: Diagnosis present

## 2021-07-23 DIAGNOSIS — E063 Autoimmune thyroiditis: Secondary | ICD-10-CM | POA: Insufficient documentation

## 2021-07-23 DIAGNOSIS — E3121 Multiple endocrine neoplasia [MEN] type I: Secondary | ICD-10-CM | POA: Diagnosis not present

## 2021-07-23 DIAGNOSIS — E039 Hypothyroidism, unspecified: Secondary | ICD-10-CM

## 2021-07-23 DIAGNOSIS — Z6841 Body Mass Index (BMI) 40.0 and over, adult: Secondary | ICD-10-CM | POA: Insufficient documentation

## 2021-07-23 DIAGNOSIS — G709 Myoneural disorder, unspecified: Secondary | ICD-10-CM | POA: Insufficient documentation

## 2021-07-23 DIAGNOSIS — R519 Headache, unspecified: Secondary | ICD-10-CM | POA: Diagnosis not present

## 2021-07-23 DIAGNOSIS — K219 Gastro-esophageal reflux disease without esophagitis: Secondary | ICD-10-CM | POA: Diagnosis not present

## 2021-07-23 DIAGNOSIS — Z85850 Personal history of malignant neoplasm of thyroid: Secondary | ICD-10-CM | POA: Insufficient documentation

## 2021-07-23 DIAGNOSIS — E892 Postprocedural hypoparathyroidism: Secondary | ICD-10-CM | POA: Insufficient documentation

## 2021-07-23 DIAGNOSIS — Z79899 Other long term (current) drug therapy: Secondary | ICD-10-CM | POA: Insufficient documentation

## 2021-07-23 DIAGNOSIS — Z9889 Other specified postprocedural states: Secondary | ICD-10-CM

## 2021-07-23 DIAGNOSIS — R000 Tachycardia, unspecified: Secondary | ICD-10-CM | POA: Diagnosis not present

## 2021-07-23 DIAGNOSIS — M2769 Other endosseous dental implant failure: Secondary | ICD-10-CM | POA: Diagnosis not present

## 2021-07-23 DIAGNOSIS — Z87442 Personal history of urinary calculi: Secondary | ICD-10-CM | POA: Diagnosis not present

## 2021-07-23 DIAGNOSIS — F319 Bipolar disorder, unspecified: Secondary | ICD-10-CM | POA: Diagnosis not present

## 2021-07-23 HISTORY — PX: MANDIBULAR HARDWARE REMOVAL: SHX5205

## 2021-07-23 LAB — POTASSIUM: Potassium: 4.3 mmol/L (ref 3.5–5.1)

## 2021-07-23 LAB — POCT I-STAT, CHEM 8
BUN: 20 mg/dL (ref 6–20)
Calcium, Ion: 1 mmol/L — ABNORMAL LOW (ref 1.15–1.40)
Chloride: 107 mmol/L (ref 98–111)
Creatinine, Ser: 1 mg/dL (ref 0.44–1.00)
Glucose, Bld: 98 mg/dL (ref 70–99)
HCT: 40 % (ref 36.0–46.0)
Hemoglobin: 13.6 g/dL (ref 12.0–15.0)
Potassium: 5.5 mmol/L — ABNORMAL HIGH (ref 3.5–5.1)
Sodium: 134 mmol/L — ABNORMAL LOW (ref 135–145)
TCO2: 24 mmol/L (ref 22–32)

## 2021-07-23 LAB — POCT PREGNANCY, URINE: Preg Test, Ur: NEGATIVE

## 2021-07-23 LAB — CALCIUM: Calcium: 9.2 mg/dL (ref 8.9–10.3)

## 2021-07-23 SURGERY — REMOVAL, HARDWARE, MANDIBLE
Anesthesia: General

## 2021-07-23 MED ORDER — EPHEDRINE SULFATE-NACL 50-0.9 MG/10ML-% IV SOSY
PREFILLED_SYRINGE | INTRAVENOUS | Status: DC | PRN
  Administered 2021-07-23: 10 mg via INTRAVENOUS
  Administered 2021-07-23: 15 mg via INTRAVENOUS
  Administered 2021-07-23 (×2): 10 mg via INTRAVENOUS
  Administered 2021-07-23: 5 mg via INTRAVENOUS

## 2021-07-23 MED ORDER — OXYCODONE HCL 5 MG/5ML PO SOLN: 5.0000 mg | Freq: Once | ORAL | Status: AC | PRN

## 2021-07-23 MED ORDER — CLOZAPINE 25 MG PO TABS
150.0000 mg | ORAL_TABLET | Freq: Every day | ORAL | Status: DC
  Administered 2021-07-23: 150 mg via ORAL
  Filled 2021-07-23 (×2): qty 2

## 2021-07-23 MED ORDER — DEXAMETHASONE SODIUM PHOSPHATE 10 MG/ML IJ SOLN
INTRAMUSCULAR | Status: AC
  Filled 2021-07-23: qty 1

## 2021-07-23 MED ORDER — PROMETHAZINE HCL 25 MG PO TABS: 25.0000 mg | ORAL_TABLET | Freq: Four times a day (QID) | ORAL | Status: DC | PRN

## 2021-07-23 MED ORDER — HYDROMORPHONE HCL 1 MG/ML IJ SOLN
0.5000 mg | INTRAMUSCULAR | Status: DC | PRN
  Administered 2021-07-23 – 2021-07-24 (×5): 0.5 mg via INTRAVENOUS
  Filled 2021-07-23 (×5): qty 0.5

## 2021-07-23 MED ORDER — NORETHIN-ETH ESTRAD-FE BIPHAS 1 MG-10 MCG / 10 MCG PO TABS
1.0000 | ORAL_TABLET | Freq: Every day | ORAL | Status: DC
  Administered 2021-07-23: 1 via ORAL

## 2021-07-23 MED ORDER — AMISULPRIDE (ANTIEMETIC) 5 MG/2ML IV SOLN
INTRAVENOUS | Status: AC
Start: 2021-07-23 — End: 2021-07-23
  Filled 2021-07-23: qty 4

## 2021-07-23 MED ORDER — ALBUTEROL SULFATE (2.5 MG/3ML) 0.083% IN NEBU: 2.5000 mg | INHALATION_SOLUTION | Freq: Four times a day (QID) | RESPIRATORY_TRACT | Status: DC | PRN

## 2021-07-23 MED ORDER — DEXTROSE-NACL 5-0.45 % IV SOLN: INTRAVENOUS | Status: AC

## 2021-07-23 MED ORDER — ONDANSETRON HCL 4 MG/2ML IJ SOLN
INTRAMUSCULAR | Status: AC
  Filled 2021-07-23: qty 2

## 2021-07-23 MED ORDER — OXYCODONE HCL ER 10 MG PO T12A
10.0000 mg | EXTENDED_RELEASE_TABLET | Freq: Two times a day (BID) | ORAL | Status: DC
  Administered 2021-07-23 – 2021-07-24 (×2): 10 mg via ORAL
  Filled 2021-07-23 (×3): qty 1

## 2021-07-23 MED ORDER — CLOZAPINE 25 MG PO TABS: 25.0000 mg | ORAL_TABLET | Freq: Every morning | ORAL | Status: DC

## 2021-07-23 MED ORDER — HYDROMORPHONE HCL 1 MG/ML IJ SOLN
INTRAMUSCULAR | Status: AC
  Filled 2021-07-23: qty 1

## 2021-07-23 MED ORDER — OXYCODONE HCL 5 MG PO TABS
5.0000 mg | ORAL_TABLET | Freq: Once | ORAL | Status: AC | PRN
  Administered 2021-07-23: 5 mg via ORAL

## 2021-07-23 MED ORDER — FENTANYL CITRATE (PF) 250 MCG/5ML IJ SOLN
INTRAMUSCULAR | Status: DC | PRN
  Administered 2021-07-23: 50 ug via INTRAVENOUS
  Administered 2021-07-23 (×2): 100 ug via INTRAVENOUS

## 2021-07-23 MED ORDER — DICYCLOMINE HCL 10 MG PO CAPS: 10.0000 mg | ORAL_CAPSULE | Freq: Every day | ORAL | Status: DC | PRN

## 2021-07-23 MED ORDER — OXYCODONE HCL 5 MG PO TABS
5.0000 mg | ORAL_TABLET | ORAL | Status: DC | PRN
  Administered 2021-07-24: 5 mg via ORAL
  Filled 2021-07-23: qty 1

## 2021-07-23 MED ORDER — LORATADINE 10 MG PO TABS
10.0000 mg | ORAL_TABLET | Freq: Every day | ORAL | Status: DC
Start: 2021-07-23 — End: 2021-07-24
  Administered 2021-07-23 – 2021-07-24 (×2): 10 mg via ORAL
  Filled 2021-07-23 (×2): qty 1

## 2021-07-23 MED ORDER — CLOZAPINE 100 MG PO TABS
200.0000 mg | ORAL_TABLET | Freq: Every day | ORAL | Status: DC
  Administered 2021-07-23: 200 mg via ORAL
  Filled 2021-07-23 (×2): qty 2

## 2021-07-23 MED ORDER — OXYCODONE HCL 5 MG PO TABS
ORAL_TABLET | ORAL | Status: AC
  Filled 2021-07-23: qty 1

## 2021-07-23 MED ORDER — CALCIUM GLUCONATE 10 % IV SOLN
INTRAVENOUS | Status: DC | PRN
  Administered 2021-07-23: 1 g via INTRAVENOUS

## 2021-07-23 MED ORDER — PROPOFOL 10 MG/ML IV BOLUS
INTRAVENOUS | Status: AC
  Filled 2021-07-23: qty 20

## 2021-07-23 MED ORDER — AMISULPRIDE (ANTIEMETIC) 5 MG/2ML IV SOLN
10.0000 mg | Freq: Once | INTRAVENOUS | Status: AC | PRN
  Administered 2021-07-23: 10 mg via INTRAVENOUS

## 2021-07-23 MED ORDER — PHENYLEPHRINE 80 MCG/ML (10ML) SYRINGE FOR IV PUSH (FOR BLOOD PRESSURE SUPPORT)
PREFILLED_SYRINGE | INTRAVENOUS | Status: AC
  Filled 2021-07-23: qty 10

## 2021-07-23 MED ORDER — ACETAMINOPHEN 500 MG PO TABS
1000.0000 mg | ORAL_TABLET | Freq: Once | ORAL | Status: AC
  Administered 2021-07-23: 1000 mg via ORAL
  Filled 2021-07-23: qty 2

## 2021-07-23 MED ORDER — LIDOCAINE-EPINEPHRINE 2 %-1:100000 IJ SOLN
INTRAMUSCULAR | Status: DC | PRN
  Administered 2021-07-23: 14 mL
  Administered 2021-07-23: 6 mL

## 2021-07-23 MED ORDER — VENLAFAXINE HCL ER 75 MG PO CP24
150.0000 mg | ORAL_CAPSULE | Freq: Every day | ORAL | Status: DC
  Administered 2021-07-24: 150 mg via ORAL
  Filled 2021-07-23: qty 2

## 2021-07-23 MED ORDER — LEVOTHYROXINE SODIUM 112 MCG PO TABS
112.0000 ug | ORAL_TABLET | Freq: Every day | ORAL | Status: DC
  Administered 2021-07-24: 112 ug via ORAL
  Filled 2021-07-23: qty 1

## 2021-07-23 MED ORDER — PROPOFOL 10 MG/ML IV BOLUS
INTRAVENOUS | Status: DC | PRN
  Administered 2021-07-23: 50 mg via INTRAVENOUS
  Administered 2021-07-23: 100 mg via INTRAVENOUS
  Administered 2021-07-23: 50 mg via INTRAVENOUS

## 2021-07-23 MED ORDER — ONDANSETRON HCL 4 MG/2ML IJ SOLN
INTRAMUSCULAR | Status: DC | PRN
  Administered 2021-07-23: 4 mg via INTRAVENOUS

## 2021-07-23 MED ORDER — CALCIUM CARBONATE 1250 (500 CA) MG PO TABS: 2.0000 | ORAL_TABLET | Freq: Every day | ORAL | Status: DC

## 2021-07-23 MED ORDER — LIDOCAINE 2% (20 MG/ML) 5 ML SYRINGE
INTRAMUSCULAR | Status: AC
  Filled 2021-07-23: qty 5

## 2021-07-23 MED ORDER — MIDAZOLAM HCL 2 MG/2ML IJ SOLN
INTRAMUSCULAR | Status: AC
  Filled 2021-07-23: qty 2

## 2021-07-23 MED ORDER — SODIUM CHLORIDE 0.9 % IR SOLN
Status: DC | PRN
  Administered 2021-07-23: 1000 mL

## 2021-07-23 MED ORDER — HYDROMORPHONE HCL 1 MG/ML IJ SOLN
0.2500 mg | INTRAMUSCULAR | Status: DC | PRN
  Administered 2021-07-23 (×3): 0.5 mg via INTRAVENOUS
  Administered 2021-07-23 (×2): 0.25 mg via INTRAVENOUS

## 2021-07-23 MED ORDER — CHLORHEXIDINE GLUCONATE 0.12 % MT SOLN
15.0000 mL | Freq: Once | OROMUCOSAL | Status: AC
  Administered 2021-07-23: 15 mL via OROMUCOSAL
  Filled 2021-07-23: qty 15

## 2021-07-23 MED ORDER — CLOZAPINE 25 MG PO TABS: 150.0000 mg | ORAL_TABLET | ORAL | Status: DC

## 2021-07-23 MED ORDER — 0.9 % SODIUM CHLORIDE (POUR BTL) OPTIME
TOPICAL | Status: DC | PRN
Start: 2021-07-23 — End: 2021-07-23
  Administered 2021-07-23: 1000 mL

## 2021-07-23 MED ORDER — LAMOTRIGINE 100 MG PO TABS
200.0000 mg | ORAL_TABLET | Freq: Every day | ORAL | Status: DC
  Administered 2021-07-23: 200 mg via ORAL
  Filled 2021-07-23: qty 2

## 2021-07-23 MED ORDER — SUGAMMADEX SODIUM 200 MG/2ML IV SOLN
INTRAVENOUS | Status: DC | PRN
Start: 2021-07-23 — End: 2021-07-23
  Administered 2021-07-23: 200 mg via INTRAVENOUS

## 2021-07-23 MED ORDER — OLANZAPINE 5 MG PO TABS
2.5000 mg | ORAL_TABLET | Freq: Four times a day (QID) | ORAL | Status: DC
  Administered 2021-07-23 – 2021-07-24 (×3): 2.5 mg via ORAL
  Filled 2021-07-23 (×3): qty 1

## 2021-07-23 MED ORDER — PREGABALIN 75 MG PO CAPS
75.0000 mg | ORAL_CAPSULE | Freq: Two times a day (BID) | ORAL | Status: DC
  Administered 2021-07-23 – 2021-07-24 (×2): 75 mg via ORAL
  Filled 2021-07-23 (×3): qty 1

## 2021-07-23 MED ORDER — POLYETHYLENE GLYCOL 3350 17 G PO PACK: 17.0000 g | PACK | Freq: Every day | ORAL | Status: DC | PRN

## 2021-07-23 MED ORDER — DEXMEDETOMIDINE (PRECEDEX) IN NS 20 MCG/5ML (4 MCG/ML) IV SYRINGE
PREFILLED_SYRINGE | INTRAVENOUS | Status: AC
  Filled 2021-07-23: qty 5

## 2021-07-23 MED ORDER — DEXAMETHASONE SODIUM PHOSPHATE 10 MG/ML IJ SOLN
INTRAMUSCULAR | Status: DC | PRN
  Administered 2021-07-23: 4 mg via INTRAVENOUS

## 2021-07-23 MED ORDER — VANCOMYCIN HCL 1500 MG/300ML IV SOLN
1500.0000 mg | INTRAVENOUS | Status: AC
  Administered 2021-07-23: 1500 mg via INTRAVENOUS
  Filled 2021-07-23 (×2): qty 300

## 2021-07-23 MED ORDER — OXYMETAZOLINE HCL 0.05 % NA SOLN
NASAL | Status: DC | PRN
  Administered 2021-07-23 (×3): 2 via NASAL

## 2021-07-23 MED ORDER — CLOZAPINE 25 MG PO TABS
175.0000 mg | ORAL_TABLET | Freq: Every day | ORAL | Status: DC
  Administered 2021-07-24: 175 mg via ORAL
  Filled 2021-07-23: qty 3

## 2021-07-23 MED ORDER — LIDOCAINE-EPINEPHRINE 2 %-1:100000 IJ SOLN
INTRAMUSCULAR | Status: AC
  Filled 2021-07-23: qty 1

## 2021-07-23 MED ORDER — FLUTICASONE PROPIONATE 50 MCG/ACT NA SUSP: 1.0000 | Freq: Every day | NASAL | Status: DC | PRN

## 2021-07-23 MED ORDER — ORAL CARE MOUTH RINSE: 15.0000 mL | Freq: Once | OROMUCOSAL | Status: AC

## 2021-07-23 MED ORDER — ROCURONIUM BROMIDE 10 MG/ML (PF) SYRINGE
PREFILLED_SYRINGE | INTRAVENOUS | Status: AC
  Filled 2021-07-23: qty 10

## 2021-07-23 MED ORDER — CYCLOBENZAPRINE HCL 10 MG PO TABS
10.0000 mg | ORAL_TABLET | Freq: Three times a day (TID) | ORAL | Status: DC | PRN
  Administered 2021-07-24: 10 mg via ORAL
  Filled 2021-07-23: qty 1

## 2021-07-23 MED ORDER — FENTANYL CITRATE (PF) 250 MCG/5ML IJ SOLN
INTRAMUSCULAR | Status: AC
  Filled 2021-07-23: qty 5

## 2021-07-23 MED ORDER — EPHEDRINE 5 MG/ML INJ
INTRAVENOUS | Status: AC
  Filled 2021-07-23: qty 5

## 2021-07-23 MED ORDER — DEXMEDETOMIDINE (PRECEDEX) IN NS 20 MCG/5ML (4 MCG/ML) IV SYRINGE
PREFILLED_SYRINGE | INTRAVENOUS | Status: DC | PRN
  Administered 2021-07-23: 12 ug via INTRAVENOUS
  Administered 2021-07-23: 4 ug via INTRAVENOUS

## 2021-07-23 MED ORDER — ONDANSETRON HCL 4 MG PO TABS: 4.0000 mg | ORAL_TABLET | Freq: Three times a day (TID) | ORAL | Status: DC | PRN

## 2021-07-23 MED ORDER — ACETAMINOPHEN 500 MG PO TABS
1000.0000 mg | ORAL_TABLET | Freq: Four times a day (QID) | ORAL | Status: DC
  Administered 2021-07-23 – 2021-07-24 (×3): 1000 mg via ORAL
  Filled 2021-07-23 (×3): qty 2

## 2021-07-23 MED ORDER — MIDAZOLAM HCL 2 MG/2ML IJ SOLN
INTRAMUSCULAR | Status: DC | PRN
  Administered 2021-07-23: 2 mg via INTRAVENOUS

## 2021-07-23 MED ORDER — LIDOCAINE 2% (20 MG/ML) 5 ML SYRINGE
INTRAMUSCULAR | Status: DC | PRN
  Administered 2021-07-23: 80 mg via INTRAVENOUS

## 2021-07-23 MED ORDER — METOPROLOL TARTRATE 5 MG/5ML IV SOLN: 5.0000 mg | Freq: Four times a day (QID) | INTRAVENOUS | Status: DC | PRN

## 2021-07-23 MED ORDER — METOPROLOL SUCCINATE ER 100 MG PO TB24
100.0000 mg | ORAL_TABLET | Freq: Every day | ORAL | Status: DC
  Administered 2021-07-23: 100 mg via ORAL
  Filled 2021-07-23: qty 1

## 2021-07-23 MED ORDER — LACTATED RINGERS IV SOLN: INTRAVENOUS | Status: DC

## 2021-07-23 MED ORDER — IBUPROFEN 600 MG PO TABS
600.0000 mg | ORAL_TABLET | Freq: Four times a day (QID) | ORAL | Status: DC | PRN
  Administered 2021-07-24: 600 mg via ORAL
  Filled 2021-07-23: qty 1

## 2021-07-23 MED ORDER — CLONAZEPAM 0.25 MG PO TBDP: 0.2500 mg | ORAL_TABLET | Freq: Every evening | ORAL | Status: DC | PRN

## 2021-07-23 MED ORDER — VITAMIN B-12 1000 MCG PO TABS
1000.0000 ug | ORAL_TABLET | Freq: Every day | ORAL | Status: DC
  Administered 2021-07-23 – 2021-07-24 (×2): 1000 ug via ORAL
  Filled 2021-07-23 (×2): qty 1

## 2021-07-23 MED ORDER — VITAMIN D 25 MCG (1000 UNIT) PO TABS
1000.0000 [IU] | ORAL_TABLET | Freq: Every day | ORAL | Status: DC
  Administered 2021-07-23 – 2021-07-24 (×2): 1000 [IU] via ORAL
  Filled 2021-07-23 (×2): qty 1

## 2021-07-23 MED ORDER — PHENYLEPHRINE 80 MCG/ML (10ML) SYRINGE FOR IV PUSH (FOR BLOOD PRESSURE SUPPORT)
PREFILLED_SYRINGE | INTRAVENOUS | Status: DC | PRN
  Administered 2021-07-23: 80 ug via INTRAVENOUS
  Administered 2021-07-23: 120 ug via INTRAVENOUS
  Administered 2021-07-23: 80 ug via INTRAVENOUS
  Administered 2021-07-23 (×3): 160 ug via INTRAVENOUS
  Administered 2021-07-23: 120 ug via INTRAVENOUS
  Administered 2021-07-23: 200 ug via INTRAVENOUS
  Administered 2021-07-23: 120 ug via INTRAVENOUS
  Administered 2021-07-23: 160 ug via INTRAVENOUS
  Administered 2021-07-23 (×2): 80 ug via INTRAVENOUS

## 2021-07-23 MED ORDER — METFORMIN HCL ER 500 MG PO TB24
1000.0000 mg | ORAL_TABLET | Freq: Two times a day (BID) | ORAL | Status: DC
  Administered 2021-07-23 – 2021-07-24 (×2): 1000 mg via ORAL
  Filled 2021-07-23 (×3): qty 2

## 2021-07-23 MED ORDER — ROCURONIUM BROMIDE 10 MG/ML (PF) SYRINGE
PREFILLED_SYRINGE | INTRAVENOUS | Status: DC | PRN
  Administered 2021-07-23 (×2): 10 mg via INTRAVENOUS
  Administered 2021-07-23: 20 mg via INTRAVENOUS
  Administered 2021-07-23: 40 mg via INTRAVENOUS

## 2021-07-23 SURGICAL SUPPLY — 60 items
"CYTOPLAST " (Dental) IMPLANT
"Nobel Active Internal NP " (Dental) IMPLANT
ADH SKN CLS APL DERMABOND .7 (GAUZE/BANDAGES/DRESSINGS) ×1
APL SKNCLS STERI-STRIP NONHPOA (GAUZE/BANDAGES/DRESSINGS)
BAG COUNTER SPONGE SURGICOUNT (BAG) ×3 IMPLANT
BAG SPNG CNTER NS LX DISP (BAG) ×1
BENZOIN TINCTURE PRP APPL 2/3 (GAUZE/BANDAGES/DRESSINGS) IMPLANT
BLADE SURG 15 STRL LF DISP TIS (BLADE) ×2 IMPLANT
BLADE SURG 15 STRL SS (BLADE) ×2
BONE CANC CHIPS 20CC PCAN1/4 (Bone Implant) ×2 IMPLANT
BUR CROSS CUT FISSURE 1.6 (BURR) ×1 IMPLANT
CANISTER SUCT 3000ML PPV (MISCELLANEOUS) ×3 IMPLANT
CHIPS CANC BONE 20CC PCAN1/4 (Bone Implant) ×1 IMPLANT
COVER SURGICAL LIGHT HANDLE (MISCELLANEOUS) ×3 IMPLANT
CYTOPLAST (Dental) ×1 IMPLANT
Cover Screw Conical Connection NP ×3 IMPLANT
DECANTER SPIKE VIAL GLASS SM (MISCELLANEOUS) ×2 IMPLANT
DERMABOND ADVANCED (GAUZE/BANDAGES/DRESSINGS) ×1
DERMABOND ADVANCED .7 DNX12 (GAUZE/BANDAGES/DRESSINGS) IMPLANT
DERMACARRIERS GRAFT 1 TO 1.5 (DISPOSABLE)
DRSG OPSITE 4X5.5 SM (GAUZE/BANDAGES/DRESSINGS) IMPLANT
DRSG OPSITE 6X11 MED (GAUZE/BANDAGES/DRESSINGS) IMPLANT
DRSG TELFA 3X8 NADH (GAUZE/BANDAGES/DRESSINGS) IMPLANT
ELECT COATED BLADE 2.86 ST (ELECTRODE) IMPLANT
ELECT NDL BLADE 2-5/6 (NEEDLE) IMPLANT
ELECT NEEDLE BLADE 2-5/6 (NEEDLE) IMPLANT
ELECT REM PT RETURN 9FT ADLT (ELECTROSURGICAL)
ELECTRODE REM PT RTRN 9FT ADLT (ELECTROSURGICAL) IMPLANT
GAUZE 4X4 16PLY ~~LOC~~+RFID DBL (SPONGE) ×3 IMPLANT
GAUZE PACKING FOLDED 2  STR (GAUZE/BANDAGES/DRESSINGS) ×2
GAUZE PACKING FOLDED 2 STR (GAUZE/BANDAGES/DRESSINGS) ×2 IMPLANT
GLOVE BIO SURGEON STRL SZ 6.5 (GLOVE) ×3 IMPLANT
GLOVE BIO SURGEON STRL SZ8 (GLOVE) ×6 IMPLANT
GOWN STRL REUS W/ TWL LRG LVL3 (GOWN DISPOSABLE) ×4 IMPLANT
GOWN STRL REUS W/ TWL XL LVL3 (GOWN DISPOSABLE) ×2 IMPLANT
GOWN STRL REUS W/TWL LRG LVL3 (GOWN DISPOSABLE) ×4
GOWN STRL REUS W/TWL XL LVL3 (GOWN DISPOSABLE) ×2
GRAFT BNE CANC CHIPS 1-8 20CC (Bone Implant) IMPLANT
GRAFT DERMACARRIERS 1 TO 1.5 (DISPOSABLE) IMPLANT
KIT TURNOVER KIT B (KITS) ×3 IMPLANT
NEEDLE 22X1 1/2 (OR ONLY) (NEEDLE) ×6 IMPLANT
NS IRRIG 1000ML POUR BTL (IV SOLUTION) ×3 IMPLANT
Nobel Active Internal NP (Dental) ×1 IMPLANT
NobelActive Internal NP (Dental) ×2 IMPLANT
PAD ARMBOARD 7.5X6 YLW CONV (MISCELLANEOUS) ×6 IMPLANT
PAD DRESSING TELFA 3X8 NADH (GAUZE/BANDAGES/DRESSINGS) IMPLANT
PENCIL BUTTON HOLSTER BLD 10FT (ELECTRODE) IMPLANT
SLEEVE IRRIGATION ELITE 7 (MISCELLANEOUS) ×3 IMPLANT
SPONGE T-LAP 18X18 ~~LOC~~+RFID (SPONGE) IMPLANT
SUT CHROMIC 3 0 PS 2 (SUTURE) ×2 IMPLANT
SUT ETHILON 5 0 P 3 18 (SUTURE)
SUT NYLON ETHILON 5-0 P-3 1X18 (SUTURE) IMPLANT
SUT STEEL 2 (SUTURE) IMPLANT
SYR 50ML SLIP (SYRINGE) IMPLANT
SYR CONTROL 10ML LL (SYRINGE) ×3 IMPLANT
TRAY ENT MC OR (CUSTOM PROCEDURE TRAY) ×3 IMPLANT
TUBE CONNECTING 12X1/4 (SUCTIONS) ×3 IMPLANT
TUBING IRRIGATION (MISCELLANEOUS) IMPLANT
WATER STERILE IRR 1000ML POUR (IV SOLUTION) ×3 IMPLANT
YANKAUER SUCT BULB TIP NO VENT (SUCTIONS) ×3 IMPLANT

## 2021-07-23 NOTE — Transfer of Care (Addendum)
Immediate Anesthesia Transfer of Care Note ? ?Patient: Ashlee Day ? ?Procedure(s) Performed: REMOVING OF FAILED IMPLANT, PLACEMENT OF DENTAL IMPLANT ?BONE GRAFTING OF MAXILLARY with  MANDIABLE BONEGRAFT and MEMBRANE ? ?Patient Location: PACU ? ?Anesthesia Type:General ? ?Level of Consciousness: awake, drowsy, patient cooperative and responds to stimulation ? ?Airway & Oxygen Therapy: Patient Spontanous Breathing and Patient connected to face mask oxygen ? ?Post-op Assessment: Report given to RN, Post -op Vital signs reviewed and stable and Patient moving all extremities X 4 ? ?Post vital signs: Reviewed and stable ? ?Last Vitals:  ?Vitals Value Taken Time  ?BP 136/101 07/23/21 1118  ?Temp    ?Pulse 91 07/23/21 1135  ?Resp 23 07/23/21 1135  ?SpO2 97 % 07/23/21 1135  ?Vitals shown include unvalidated device data. ? ?Last Pain:  ?Vitals:  ? 07/23/21 0630  ?TempSrc:   ?PainSc: 8   ?   ? ?Patients Stated Pain Goal: 7 (07/23/21 0630) ? ?Complications: No notable events documented. ?

## 2021-07-23 NOTE — H&P (Signed)
H&P documentation  -History and Physical Reviewed  -Patient has been re-examined  -No change in the plan of care  Ashlee Day  

## 2021-07-23 NOTE — Op Note (Signed)
NAME: Ashlee Day, Ashlee Day ?MEDICAL RECORD NO: [id number] ?ACCOUNT NO: [id number] ?DATE OF BIRTH: [birth year] ?FACILITY: [facility] ?LOCATION: [facility] ?PHYSICIAN: Gae Bon, DDS ? ?Operative Report  ? ?DATE OF PROCEDURE: 07/23/2021 ? ?PREOPERATIVE DIAGNOSIS:  Maxillary and mandibular atrophy.  Failed implant, right mandible. ? ?POSTOPERATIVE DIAGNOSES:  Maxillary and mandibular atrophy.  Failed implant, right mandible. ? ?PROCEDURE:  Removal of failed implant, placement of dental implants x3, bone grafting of maxilla and mandible. ? ?SURGEON:  Gae Bon, DDS ? ?ANESTHESIA:  General, Dr. Lissa Hoard, attending. ? ?DESCRIPTION OF PROCEDURE:  The patient was taken to the operating room and placed on the table in supine position.  General anesthesia was administered and a nasal endotracheal tube was placed and secured.  The eyes were protected and the patient was  ?draped for surgery.  Timeout was performed.  The posterior pharynx was suctioned and a throat pack was placed.  2% lidocaine 1:100,000 epinephrine was infiltrated in an inferior alveolar block on the right and left sides of the mandible and in buccal and ? palatal infiltration in the maxilla.  Additional anesthesia was given in the buccal sulcus of the mandible.  The maxilla was operated first.  A 15 blade was used to make an incision from molar to molar with vertical releases on each side.  The  ?periosteum was reflected to expose the maxillary bone.  The periosteum was tenaciously adherent to the bone and the flap was reflected to expose the buccal and palatal so that the prefabricated surgical guide could be inserted. The guide did not fit as  ?well as it had been hoped due to existing bone loss.  The guide was then secured to the maxilla using screws and then using the guided technique, 3.5 diameter, 8.5 mm length implants were placed in the area of teeth #7 and #10.  The implant #10 had some  ?exposure on the palatal aspect for approximately 2 mm from the  crest of the ridge. Bone graft was placed here and a membrane was used to prevent epithelial ingrowth.  Additional bone graft was placed between the tooth implant #10, and the preexisting  ?implant #13.  Then, the maxilla was closed with 4-0 cytoplast suture and 3-0 chromic.  Then, the mandible was operated.  The 15 blade was used to make an incision at the area of the right first molar, carried across the midline along the alveolar crest  ?to the area of the canine.  A vertical release was made in both ends of the incision.  The periosteum was reflected to expose the alveolar crest.  The reflection was buccal and lingual.  The preexisting surgical guide was positioned and then screwed to  ?the mandible and then using-guided technique, a 3.5 mm diameter x 8.5 mm length implant was placed in the area of tooth #29.  The implant in the area of tooth #27, which had failed was removed using the ranch and the implant kit and the implant was  ?backed out easily.  After the implant was placed at 29 the failed implant was curetted.  Bone graft was placed and an additional resorbable collagen membrane was placed.  Then, the area was closed with the 4-0 cytoplast and 3-0 chromic.  The oral cavity  ?was irrigated and suctioned.  The throat pack was removed.  Additional anesthesia was administered prior to removal of the throat pack.  The patient was left under care of anesthesia for extubation and transport to recovery room with plans for discharge  ?  home through day surgery. ? ?ESTIMATED BLOOD LOSS:  Minimum. ? ?COMPLICATIONS:  None. ? ?SPECIMENS:  None. ? ? ?PUS ?D: 07/23/2021 10:39:51 am T: 07/23/2021 2:21:00 pm  ?JOB: [id number]/ [id number]  ?

## 2021-07-23 NOTE — Progress Notes (Signed)
CBC/BMP drawn by lab tech rejected by lab. Ok to draw ISTAT per Dr. Lissa Hoard. Patient potassium resulted 5.5 on ISTAT. Dr. Lissa Hoard made aware. No new orders received.  ?

## 2021-07-23 NOTE — Op Note (Signed)
07/23/2021 ? ?10:34 AM ? ?PATIENT:  Ashlee Day  36 y.o. female ? ?PRE-OPERATIVE DIAGNOSIS:  BONE ATROPHY,FAILED IMPLANT ? ?POST-OPERATIVE DIAGNOSIS:  SAME ? ?PROCEDURE:  Procedure(s): ?REMOVING OF FAILED IMPLANT, PLACEMENT OF DENTAL IMPLANTS x3; ?BONE GRAFTING OF MAXILLA and MANDIBLE ? ?SURGEON:  Surgeon(s): ?Diona Browner, DMD ? ?ANESTHESIA:   local and general ? ?EBL:  minimal ? ?DRAINS: none  ? ?SPECIMEN:  No Specimen ? ?COUNTS:  YES ? ?PLAN OF CARE: 23-h obs ? ?PATIENT DISPOSITION:  PACU - hemodynamically stable. ?  ?PROCEDURE DETAILS: ?Dictation # [id number] ? ?Ashlee Day, DMD ?07/23/2021 ?10:34 AM ? ? ? ? ? ? ? ? ? ? ? ? ? ? ? ?  ?

## 2021-07-23 NOTE — Anesthesia Procedure Notes (Signed)
Procedure Name: Intubation ?Date/Time: 07/23/2021 7:44 AM ?Performed by: Cathren Harsh, CRNA ?Pre-anesthesia Checklist: Patient identified, Emergency Drugs available, Suction available and Patient being monitored ?Patient Re-evaluated:Patient Re-evaluated prior to induction ?Oxygen Delivery Method: Circle System Utilized ?Preoxygenation: Pre-oxygenation with 100% oxygen ?Induction Type: IV induction ?Ventilation: Mask ventilation without difficulty and Oral airway inserted - appropriate to patient size ?Laryngoscope Size: Glidescope and 3 ?Grade View: Grade I ?Nasal Tubes: Nasal Rae, Nasal prep performed and Right ?Tube size: 6.5 mm ?Number of attempts: 1 ?Airway Equipment and Method: Stylet and Oral airway ?Placement Confirmation: ETT inserted through vocal cords under direct vision, positive ETCO2 and breath sounds checked- equal and bilateral ?Secured at: 23 cm ?Tube secured with: Tape ?Dental Injury: Teeth and Oropharynx as per pre-operative assessment  ? ? ? ? ?

## 2021-07-24 ENCOUNTER — Encounter (HOSPITAL_COMMUNITY): Payer: Self-pay | Admitting: Oral Surgery

## 2021-07-24 DIAGNOSIS — M2761 Osseointegration failure of dental implant: Secondary | ICD-10-CM | POA: Diagnosis not present

## 2021-07-24 MED ORDER — OXYCODONE-ACETAMINOPHEN 7.5-325 MG PO TABS: 1.0000 | ORAL_TABLET | ORAL | 0 refills | Status: AC | PRN

## 2021-07-24 MED ORDER — CLINDAMYCIN HCL 300 MG PO CAPS: 300.0000 mg | ORAL_CAPSULE | Freq: Three times a day (TID) | ORAL | 0 refills | Status: DC

## 2021-07-24 MED ORDER — OXYCODONE HCL 5 MG PO TABS: 5.0000 mg | ORAL_TABLET | ORAL | 0 refills | Status: DC | PRN

## 2021-07-24 NOTE — Discharge Summary (Signed)
Patient ID: ?Ashlee Day ?MRN: [id number] ?DOB/AGE: 1985-11-28 36 y.o. ? ?Admit date: 07/23/2021 ?Discharge date: 07/24/2021 ? 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 ?Discharge Diagnoses:  ?Principal Problem: ?  Dental implant osseointegration failure ?Active Problems: ?  Post-operative state ? ? ?Discharged Condition: good ? ?Hospital Course: Patient admitted postoperatively for pain control and IV fluids.  ? ?Consults: None ? ?Significant Diagnostic Studies: labs: Chem 21 ? ?Treatments: IV hydration, antibiotics: vancomycin, and analgesia: Oxycodone, Surgery ? ?Discharge Exam: ?Blood pressure 123/83, pulse 93, temperature 98.2 ?F (36.8 ?C), temperature source Oral, resp. rate 18, height '5\' 3"'$  (1.6 m), weight 103 kg, last menstrual period 07/10/2021, SpO2 95 %. ?General appearance: alert, cooperative, no distress, and morbidly obese ?Head: Normocephalic, without obvious abnormality, atraumatic ?Eyes: conjunctivae/corneas clear. PERRL, EOM's intact. Fundi benign. ?Nose: Nares normal. Septum midline. Mucosa normal. No drainage or sinus tenderness. ?Throat: ASutures intact, hemostatic, minimal edema. ?Neck: no adenopathy ? ?Disposition: Discharge disposition: 01-Home or Self Care ? ? ? ? ? ? ?Discharge Instructions   ? ? Call MD for:  difficulty breathing, headache or visual disturbances   Complete by: As directed ?  ? Call MD for:  persistant nausea and vomiting   Complete by: As directed ?  ? Call MD for:  severe uncontrolled pain   Complete by: As directed ?  ? Call MD for:  temperature >100.4   Complete by: As directed ?  ? Diet - low sodium heart healthy   Complete by: As directed ?  ? Discharge instructions   Complete by: As directed ?  ? Ice to affected area for 2-3 days.  ?Soft diet, advance as tolerated. High protein intake will help healing. ?Warm salt water mouth rinses 4-5 times per day starting the day after surgery. ?No smoking or vaping for 2 weeks. ?For  problems, call [phone].  ? Increase activity slowly   Complete by: As directed ?  ? No dressing needed   Complete by: As directed ?  ? ?  ? ?Allergies as of 07/24/2021   ? ?   Reactions  ? Enoxaparin Nausea Only  ? Paroxetine Hcl   ? Other reaction(s): Other ?Hallucinations and agitation  ? Amoxicillin Other (See Comments)  ? Blisters all over ?Cannot take any 'cillins' at all  ? Demerol [meperidine] Nausea And Vomiting, Other (See Comments)  ? Can not tolerate Iv administration ?Can tolerate IM adminstration  ? Haldol [haloperidol] Other (See Comments)  ? Reverse effect  ? Imitrex [sumatriptan] Other (See Comments)  ? Severity unknown  ? Macrobid [nitrofurantoin] Nausea And Vomiting  ? Other Itching, Other (See Comments)  ? Pink NG tape  ? Penicillins Other (See Comments)  ? Cannot not tolerate any cillins due to amoxicillin reaction  ? Buprenorphine Rash  ? Depacon [valproic Acid] Nausea And Vomiting, Rash  ? Depakote [divalproex Sodium] Nausea And Vomiting, Rash  ? Dihydroergotamine Nausea And Vomiting, Rash  ? Metoclopramide Nausea And Vomiting, Rash, Other (See Comments)  ? React to IV form ?Can tolerate oral medication  ? Morphine And Related Rash, Other (See Comments)  ? IV form  ? ?  ? ?  ?Medication List  ?  ? ?STOP taking these medications   ? ?HYDROcodone-acetaminophen 7.5-325 MG tablet ?Commonly known as: NORCO ?  ? ?  ? ?TAKE these medications   ? ?albuterol 108 (90 Base) MCG/ACT inhaler ?Commonly known as: VENTOLIN HFA ?Inhale 2 puffs into the lungs every 6 (six) hours as needed  for wheezing or shortness of breath. ?  ?calcium carbonate 600 MG Tabs tablet ?Commonly known as: OS-CAL ?Take 2 tablets by mouth daily. ?  ?cetirizine 10 MG tablet ?Commonly known as: ZYRTEC ?Take 10 mg by mouth daily as needed for allergies. ?  ?cholecalciferol 1000 units tablet ?Commonly known as: VITAMIN D ?Take 1 tablet (1,000 Units total) by mouth daily. For nutritional supplement. ?  ?clindamycin 300 MG  capsule ?Commonly known as: CLEOCIN ?Take 1 capsule (300 mg total) by mouth 3 (three) times daily. ?  ?clonazePAM 0.5 MG tablet ?Commonly known as: KLONOPIN ?Take 0.25 mg by mouth at bedtime as needed (sleep). ?  ?cloZAPine 100 MG tablet ?Commonly known as: CLOZARIL ?Take 150-200 mg by mouth See admin instructions. Take 150 mg in the morning, take with 25 mg  for a total of 175 mg in the morning, 200 mg  in the afternoon & 150 mg bedtime ?  ?cloZAPine 25 MG tablet ?Commonly known as: CLOZARIL ?Take 25 mg by mouth in the morning. ?  ?cyclobenzaprine 10 MG tablet ?Commonly known as: FLEXERIL ?Take 10 mg by mouth 3 (three) times daily as needed for muscle spasms. ?  ?diclofenac Sodium 1 % Gel ?Commonly known as: VOLTAREN ?Apply 1 application. topically 2 (two) times daily. ?  ?dicyclomine 10 MG capsule ?Commonly known as: BENTYL ?Take 10 mg by mouth daily as needed for spasms. ?  ?fluticasone 50 MCG/ACT nasal spray ?Commonly known as: FLONASE ?Place 1 spray into both nostrils daily as needed for allergies or rhinitis. ?  ?ibuprofen 600 MG tablet ?Commonly known as: ADVIL ?Take 1 tablet (600 mg total) by mouth every 6 (six) hours as needed. ?What changed: reasons to take this ?  ?lamoTRIgine 200 MG tablet ?Commonly known as: LAMICTAL ?Take 1 tablet (200 mg total) by mouth at bedtime. For mood lability. ?  ?levothyroxine 112 MCG tablet ?Commonly known as: SYNTHROID ?Take 112 mcg by mouth daily before breakfast. ?  ?lidocaine 5 % ointment ?Commonly known as: XYLOCAINE ?Apply 1 application. topically 2 (two) times a week. ?  ?metFORMIN 500 MG 24 hr tablet ?Commonly known as: GLUCOPHAGE-XR ?Take 1,000 mg by mouth 2 (two) times daily. ?  ?metoprolol succinate 100 MG 24 hr tablet ?Commonly known as: TOPROL-XL ?Take 100 mg by mouth at bedtime. ?  ?Norethindrone-Ethinyl Estradiol-Fe Biphas 1 MG-10 MCG / 10 MCG tablet ?Commonly known as: LO LOESTRIN FE ?Take 1 tablet by mouth daily. ?  ?OLANZapine 2.5 MG tablet ?Commonly known  as: ZYPREXA ?Take 2.5 mg by mouth 4 (four) times daily. ?  ?ondansetron 4 MG tablet ?Commonly known as: ZOFRAN ?Take 4 mg by mouth every 8 (eight) hours as needed for nausea/vomiting. ?  ?oxyCODONE 5 MG immediate release tablet ?Commonly known as: Oxy IR/ROXICODONE ?Take 1 tablet (5 mg total) by mouth every 4 (four) hours as needed. ?  ?oxyCODONE-acetaminophen 7.5-325 MG tablet ?Commonly known as: Percocet ?Take 1 tablet by mouth every 4 (four) hours as needed for severe pain. ?  ?polyethylene glycol 17 g packet ?Commonly known as: MIRALAX / GLYCOLAX ?Take 17 g by mouth daily as needed for mild constipation or moderate constipation. ?  ?pregabalin 75 MG capsule ?Commonly known as: LYRICA ?Take 75 mg by mouth 2 (two) times daily. ?  ?promethazine 25 MG tablet ?Commonly known as: PHENERGAN ?Take 1 tablet (25 mg total) by mouth every 6 (six) hours as needed for nausea or vomiting. ?  ?venlafaxine XR 150 MG 24 hr capsule ?Commonly known as: EFFEXOR-XR ?Take  150 mg by mouth daily with breakfast. ?  ?vitamin B-12 1000 MCG tablet ?Commonly known as: CYANOCOBALAMIN ?Take 1,000 mcg by mouth daily. ?  ?Xtampza ER 9 MG C12a ?Generic drug: oxyCODONE ER ?Take 9 mg by mouth every 12 (twelve) hours. ?  ? ?  ? ?  ?  ? ? ?  ?Discharge Care Instructions  ?(From admission, onward)  ?  ? ? ?  ? ?  Start     Ordered  ? 07/24/21 0000  No dressing needed       ? 07/24/21 1155  ? ?  ?  ? ?  ? ? ? ?Signed: ?Diona Browner ?07/24/2021, 11:56 AM  ?

## 2021-07-24 NOTE — Anesthesia Postprocedure Evaluation (Signed)
Anesthesia Post Note ? ?Patient: Ashlee Day ? ?Procedure(s) Performed: REMOVING OF FAILED IMPLANT, PLACEMENT OF DENTAL IMPLANT ?BONE GRAFTING OF MAXILLARY with  MANDIABLE BONEGRAFT and MEMBRANE ? ?  ? ?Patient location during evaluation: PACU ?Anesthesia Type: General ?Level of consciousness: sedated and patient cooperative ?Pain management: pain level controlled ?Vital Signs Assessment: post-procedure vital signs reviewed and stable ?Respiratory status: spontaneous breathing ?Cardiovascular status: stable ?Anesthetic complications: no ? ? ?No notable events documented. ? ?Last Vitals:  ?Vitals:  ? 07/24/21 0611 07/24/21 0841  ?BP: (!) 149/89 123/83  ?Pulse: 88 93  ?Resp: 20 18  ?Temp: 36.4 ?C 36.8 ?C  ?SpO2: 96% 95%  ?  ?Last Pain:  ?Vitals:  ? 07/24/21 0841  ?TempSrc: Oral  ?PainSc:   ? ? ?  ?  ?  ?  ?  ?  ? ?Nolon Nations ? ? ? ? ?

## 2021-07-24 NOTE — Progress Notes (Signed)
Patient given discharge instructions and stated understanding. 

## 2022-06-12 ENCOUNTER — Emergency Department (HOSPITAL_BASED_OUTPATIENT_CLINIC_OR_DEPARTMENT_OTHER)
Admission: EM | Admit: 2022-06-12 | Discharge: 2022-06-12 | Disposition: A | Discharge status: Home / Self Care | Payer: Medicare Other | Attending: Emergency Medicine | Admitting: Emergency Medicine

## 2022-06-12 ENCOUNTER — Emergency Department (HOSPITAL_BASED_OUTPATIENT_CLINIC_OR_DEPARTMENT_OTHER): Payer: Medicare Other

## 2022-06-12 ENCOUNTER — Encounter (HOSPITAL_BASED_OUTPATIENT_CLINIC_OR_DEPARTMENT_OTHER): Payer: Self-pay

## 2022-06-12 ENCOUNTER — Other Ambulatory Visit: Payer: Self-pay

## 2022-06-12 DIAGNOSIS — N611 Abscess of the breast and nipple: Secondary | ICD-10-CM | POA: Insufficient documentation

## 2022-06-12 DIAGNOSIS — Z85850 Personal history of malignant neoplasm of thyroid: Secondary | ICD-10-CM | POA: Diagnosis not present

## 2022-06-12 DIAGNOSIS — L0291 Cutaneous abscess, unspecified: Secondary | ICD-10-CM

## 2022-06-12 MED ORDER — DOXYCYCLINE HYCLATE 100 MG PO CAPS: 100.0000 mg | ORAL_CAPSULE | Freq: Two times a day (BID) | ORAL | 0 refills | Status: DC

## 2022-06-12 MED ORDER — DOXYCYCLINE HYCLATE 100 MG PO TABS
100.0000 mg | ORAL_TABLET | Freq: Once | ORAL | Status: AC
  Administered 2022-06-12: 100 mg via ORAL
  Filled 2022-06-12: qty 1

## 2022-06-12 MED ORDER — NAPROXEN 500 MG PO TABS: 500.0000 mg | ORAL_TABLET | Freq: Two times a day (BID) | ORAL | 0 refills | Status: DC

## 2022-06-12 MED ORDER — KETOROLAC TROMETHAMINE 15 MG/ML IJ SOLN
15.0000 mg | Freq: Once | INTRAMUSCULAR | Status: AC
  Administered 2022-06-12: 15 mg via INTRAMUSCULAR
  Filled 2022-06-12: qty 1

## 2022-06-12 NOTE — ED Provider Notes (Signed)
[place] [facility] Provider Note   CSN: [id number] Arrival date & time: 06/12/22  1536     History  Chief Complaint  Patient presents with   Abscess    Ashlee Day is a 37 y.o. female.  She has past medical history of schizoaffective disorder and thyroid cancer.  Presents the ER for 2 days of tenderness to the right breast.  She had an abscess of the past that had to be drained by surgery and this feels similar.  She noticed it when she was trying off after getting out of the shower and noticed some tenderness.  She denies fevers or chills, no nausea vomiting or other symptoms.  She denies history of diabetes.   Abscess      Home Medications Prior to Admission medications   Medication Sig Start Date End Date Taking? Authorizing Provider  doxycycline (VIBRAMYCIN) 100 MG capsule Take 1 capsule (100 mg total) by mouth 2 (two) times daily. 06/12/22  Yes Maitland Lesiak A, PA-C  naproxen (NAPROSYN) 500 MG tablet Take 1 tablet (500 mg total) by mouth 2 (two) times daily. 06/12/22  Yes Nickalos Petersen A, PA-C  albuterol (PROVENTIL HFA;VENTOLIN HFA) 108 (90 Base) MCG/ACT inhaler Inhale 2 puffs into the lungs every 6 (six) hours as needed for wheezing or shortness of breath.    [provider]  calcium carbonate (OS-CAL) 600 MG TABS tablet Take 2 tablets by mouth daily.    [provider]  cetirizine (ZYRTEC) 10 MG tablet Take 10 mg by mouth daily as needed for allergies.    [provider]  cholecalciferol (VITAMIN D) 1000 UNITS tablet Take 1 tablet (1,000 Units total) by mouth daily. For nutritional supplement. 06/16/12   Ruben Im, PA-C  clindamycin (CLEOCIN) 300 MG capsule Take 1 capsule (300 mg total) by mouth 3 (three) times daily. 07/24/21   Diona Browner, DMD  clonazePAM (KLONOPIN) 0.5 MG tablet Take 0.25 mg by mouth at bedtime as needed (sleep).    [provider]  cloZAPine (CLOZARIL) 100 MG tablet Take  150-200 mg by mouth See admin instructions. Take 150 mg in the morning, take with 25 mg  for a total of 175 mg in the morning, 200 mg  in the afternoon & 150 mg bedtime    [provider]  cloZAPine (CLOZARIL) 25 MG tablet Take 25 mg by mouth in the morning.    [provider]  cyclobenzaprine (FLEXERIL) 10 MG tablet Take 10 mg by mouth 3 (three) times daily as needed for muscle spasms.    [provider]  diclofenac Sodium (VOLTAREN) 1 % GEL Apply 1 application. topically 2 (two) times daily. 07/05/21   [provider]  dicyclomine (BENTYL) 10 MG capsule Take 10 mg by mouth daily as needed for spasms.    [provider]  fluticasone (FLONASE) 50 MCG/ACT nasal spray Place 1 spray into both nostrils daily as needed for allergies or rhinitis.    [provider]  ibuprofen (ADVIL) 600 MG tablet Take 1 tablet (600 mg total) by mouth every 6 (six) hours as needed. Patient taking differently: Take 600 mg by mouth every 6 (six) hours as needed for moderate pain or mild pain. 10/02/20   Joy, Shawn C, PA-C  lamoTRIgine (LAMICTAL) 200 MG tablet Take 1 tablet (200 mg total) by mouth at bedtime. For mood lability. 07/26/12   Patrick Jupiter, PA-C  levothyroxine (SYNTHROID, LEVOTHROID) 112 MCG tablet Take 112 mcg  by mouth daily before breakfast.     [provider]  lidocaine (XYLOCAINE) 5 % ointment Apply 1 application. topically 2 (two) times a week. 05/18/17   [provider]  metFORMIN (GLUCOPHAGE-XR) 500 MG 24 hr tablet Take 1,000 mg by mouth 2 (two) times daily.    [provider]  metoprolol succinate (TOPROL-XL) 100 MG 24 hr tablet Take 100 mg by mouth at bedtime. 04/28/21   [provider]  Norethindrone-Ethinyl Estradiol-Fe Biphas (LO LOESTRIN FE) 1 MG-10 MCG / 10 MCG tablet Take 1 tablet by mouth daily.    [provider]  OLANZapine (ZYPREXA) 2.5 MG tablet Take 2.5 mg by mouth 4 (four) times daily.    [provider]  ondansetron (ZOFRAN) 4 MG tablet Take 4 mg by mouth every 8 (eight) hours as needed for nausea/vomiting. 08/25/15   [provider]  oxyCODONE (OXY IR/ROXICODONE) 5 MG immediate release tablet Take 1 tablet (5 mg total) by mouth every 4 (four) hours as needed. 07/24/21   Diona Browner, DMD  oxyCODONE-acetaminophen (PERCOCET) 7.5-325 MG tablet Take 1 tablet by mouth every 4 (four) hours as needed for severe pain. 07/24/21 07/24/22  Diona Browner, DMD  polyethylene glycol (MIRALAX / GLYCOLAX) packet Take 17 g by mouth daily as needed for mild constipation or moderate constipation.    [provider]  pregabalin (LYRICA) 75 MG capsule Take 75 mg by mouth 2 (two) times daily. 06/30/21   [provider]  promethazine (PHENERGAN) 25 MG tablet Take 1 tablet (25 mg total) by mouth every 6 (six) hours as needed for nausea or vomiting. 11/27/17   Glyn Ade, PA-C  venlafaxine XR (EFFEXOR-XR) 150 MG 24 hr capsule Take 150 mg by mouth daily with breakfast.    [provider]  vitamin B-12 (CYANOCOBALAMIN) 1000 MCG tablet Take 1,000 mcg by mouth daily.    [provider]  XTAMPZA ER 9 MG C12A Take 9 mg by mouth every 12 (twelve) hours. 11/07/16   [provider]      Allergies    Enoxaparin, Paroxetine hcl, Amoxicillin, Demerol [meperidine], Haldol [haloperidol], Imitrex [sumatriptan], Macrobid [nitrofurantoin], Other, Penicillins, Buprenorphine, Depacon [valproic acid], Depakote [divalproex sodium], Dihydroergotamine, Metoclopramide, and Morphine and related    Review of Systems   Review of Systems  Physical Exam Updated Vital Signs BP 115/74 (BP Location: Left Arm)   Pulse (!) 102   Temp 97.7 F (36.5 C) (Oral)   Resp 18   Ht '5\' 3"'$  (1.6 m)   Wt 103 kg   SpO2 94%   BMI 40.21 kg/m  Physical Exam Vitals and nursing note reviewed.  Constitutional:      General: She is not in acute distress.    Appearance: She is well-developed.   HENT:     Head: Normocephalic and atraumatic.  Eyes:     Conjunctiva/sclera: Conjunctivae normal.  Cardiovascular:     Rate and Rhythm: Normal rate and regular rhythm.     Heart sounds: No murmur heard. Pulmonary:     Effort: Pulmonary effort is normal. No respiratory distress.     Breath sounds: Normal breath sounds.  Abdominal:     Palpations: Abdomen is soft.     Tenderness: There is no abdominal tenderness.  Musculoskeletal:        General: No swelling.     Cervical back: Neck supple.  Skin:    General: Skin is warm and dry.     Capillary Refill: Capillary refill takes less  than 2 seconds.  Neurological:     Mental Status: She is alert.  Psychiatric:        Mood and Affect: Mood normal.     ED Results / Procedures / Treatments   Labs (all labs ordered are listed, but only abnormal results are displayed) Labs Reviewed - No data to display  EKG None  Radiology [place] CHEST SOFT TISSUE  Result Date: 06/12/2022 CLINICAL DATA:  Right breast pain, redness, and swelling. EXAM: ULTRASOUND OF RIGHT BREAST SOFT TISSUES TECHNIQUE: Ultrasound examination was performed in the area of clinical concern. COMPARISON:  None Available. FINDINGS: Targeted sonographic images of the right breast performed. There is a 2.1 x 0.9 x 1.8 cm heterogeneous predominantly hypoechoic area of with vascularity at 7 o'clock position approximately 3 cm from the nipple. This most consistent with an area of phlegmonous change. Clinical correlation and follow-up recommended. No drainable fluid collection. There appears to be irregularity of the overlying skin. IMPRESSION: Probable phlegmon.  No drainable fluid collection at this time. Please note this is a very limited 6 evaluation of the breast for assessment of abscess only. Electronically Signed   By: Anner Crete M.D.   On: 06/12/2022 17:11    Procedures Procedures    Medications Ordered in ED Medications  ketorolac (TORADOL) 15 MG/ML injection 15 mg  (15 mg Intramuscular Given 06/12/22 1741)  doxycycline (VIBRA-TABS) tablet 100 mg (100 mg Oral Given 06/12/22 1741)    ED Course/ Medical Decision Making/ A&P                             Medical Decision Making This patient presents to the ED for concern of breast abscess x 2 days, this involves an extensive number of treatment options, and is a complaint that carries with it a high risk of complications and morbidity.  The differential diagnosis includes abscess, cellulitis, necrotizing fasciitis, epidermal inclusion cyst, lipoma, other     Additional history obtained:  Additional history obtained from EMR External records from outside source obtained and reviewed including prior breast abscess requiring drainage in 2021     Imaging Studies ordered:  I ordered imaging studies including ultrasound soft tissue right breast I independently visualized and interpreted imaging which showed phlegmonous change, no definite fluid collection I agree with the radiologist interpretation     Problem List / ED Course / Critical interventions / Medication management  Right breast abscess-ultrasound shows phlegmonous change, patient will be started antibiotics and was referred to breast center Monday, has to follow-up Monday morning for repeat evaluation and possible drainage if indicated at that time. I ordered medication including Toradol for pain  I have reviewed the patients home medicines and have made adjustments as needed      Amount and/or Complexity of Data Reviewed Radiology: ordered.  Risk Prescription drug management.           Final Clinical Impression(s) / ED Diagnoses Final diagnoses:  Abscess    Rx / DC Orders ED Discharge Orders          Ordered    doxycycline (VIBRAMYCIN) 100 MG capsule  2 times daily        06/12/22 1730    naproxen (NAPROSYN) 500 MG tablet  2 times daily        06/12/22 1730              Gwenevere Abbot, PA-C 06/12/22  1743    [place],  Livingston Diones, MD 06/13/22 [phone]

## 2022-06-12 NOTE — ED Triage Notes (Signed)
Pt is here for potential abscess underneath R breast, pt said she noticed it after getting out of the shower. Site is red and swollen, pt reports pain at the site that radiates to R ribcage. Pt denies fever/chills. Hx of the same 2.5 years ago

## 2022-06-12 NOTE — Discharge Instructions (Addendum)
Your ultrasound does not show any definite fluid to be drained.  You are going to be started on antibiotics.  We would like you to follow-up with the breast center for reevaluation and possible drainage.  They open at 7 AM on Monday morning and the number is on your paperwork.  You can call your primary care doctor or the [facility] to help facilitate getting the drainage you have any problems.  Please be sure to take all of the antibiotics.

## 2023-07-12 NOTE — H&P (Signed)
 HISTORY AND PHYSICAL  Ashlee Day is a 38 y.o. female patient s/p placement multiple dental implants 07/23/2021. Implant failure at tooth # 10 site with bone loss. Implant removed 11/29/2022. Now for bone graft of area roe reimplantation when healed  No diagnosis found.  Past Medical History:  Diagnosis Date   Ankle fracture    right   Bipolar disorder (HCC)    Cancer (HCC)    THYROID CANCER   Chronic pain disorder    Depression    Dysrhythmia    SURGES TACHYCARDIA     Family history of adverse reaction to anesthesia    mother also has nausea    Fibromyalgia    GERD (gastroesophageal reflux disease)    Hashimoto's thyroiditis    2005ish   History of kidney stones    2013+2017   Hyperparathyroidism (HCC)    Hyperparathyroidism, unspecified (HCC)    Hypertension    Hypothyroidism    Ileus (HCC)    x3-4   MEN 1 (multiple endocrine neoplasia) (HCC)    Migraines    occasional aura   Partial edentulism    Pituitary adenoma (HCC)    Polycystic disease, ovaries    PONV (postoperative nausea and vomiting)    Psychosis (HCC)    ORGANIC PSYCHOSIS     Torticollis     No current facility-administered medications for this encounter.   Current Outpatient Medications  Medication Sig Dispense Refill   albuterol (PROVENTIL HFA;VENTOLIN HFA) 108 (90 Base) MCG/ACT inhaler Inhale 2 puffs into the lungs every 6 (six) hours as needed for wheezing or shortness of breath.     calcium carbonate (OS-CAL) 600 MG TABS tablet Take 2 tablets by mouth daily.     cetirizine (ZYRTEC) 10 MG tablet Take 10 mg by mouth daily as needed for allergies.     cholecalciferol (VITAMIN D) 1000 UNITS tablet Take 1 tablet (1,000 Units total) by mouth daily. For nutritional supplement.     clindamycin (CLEOCIN) 300 MG capsule Take 1 capsule (300 mg total) by mouth 3 (three) times daily. 21 capsule 0   clonazePAM (KLONOPIN) 0.5 MG tablet Take 0.25 mg by mouth at bedtime as needed (sleep).     cloZAPine  (CLOZARIL) 100 MG tablet Take 150-200 mg by mouth See admin instructions. Take 150 mg in the morning, take with 25 mg  for a total of 175 mg in the morning, 200 mg  in the afternoon & 150 mg bedtime     cloZAPine (CLOZARIL) 25 MG tablet Take 25 mg by mouth in the morning.     cyclobenzaprine (FLEXERIL) 10 MG tablet Take 10 mg by mouth 3 (three) times daily as needed for muscle spasms.     diclofenac Sodium (VOLTAREN) 1 % GEL Apply 1 application. topically 2 (two) times daily.     dicyclomine (BENTYL) 10 MG capsule Take 10 mg by mouth daily as needed for spasms.     doxycycline (VIBRAMYCIN) 100 MG capsule Take 1 capsule (100 mg total) by mouth 2 (two) times daily. 20 capsule 0   fluticasone (FLONASE) 50 MCG/ACT nasal spray Place 1 spray into both nostrils daily as needed for allergies or rhinitis.     ibuprofen (ADVIL) 600 MG tablet Take 1 tablet (600 mg total) by mouth every 6 (six) hours as needed. (Patient taking differently: Take 600 mg by mouth every 6 (six) hours as needed for moderate pain or mild pain.) 30 tablet 0   lamoTRIgine (LAMICTAL) 200 MG tablet Take 1 tablet (  200 mg total) by mouth at bedtime. For mood lability. 60 tablet 1   levothyroxine (SYNTHROID, LEVOTHROID) 112 MCG tablet Take 112 mcg by mouth daily before breakfast.      lidocaine (XYLOCAINE) 5 % ointment Apply 1 application. topically 2 (two) times a week.     metFORMIN (GLUCOPHAGE-XR) 500 MG 24 hr tablet Take 1,000 mg by mouth 2 (two) times daily.     metoprolol succinate (TOPROL-XL) 100 MG 24 hr tablet Take 100 mg by mouth at bedtime.     naproxen (NAPROSYN) 500 MG tablet Take 1 tablet (500 mg total) by mouth 2 (two) times daily. 10 tablet 0   Norethindrone-Ethinyl Estradiol-Fe Biphas (LO LOESTRIN FE) 1 MG-10 MCG / 10 MCG tablet Take 1 tablet by mouth daily.     OLANZapine (ZYPREXA) 2.5 MG tablet Take 2.5 mg by mouth 4 (four) times daily.     ondansetron (ZOFRAN) 4 MG tablet Take 4 mg by mouth every 8 (eight) hours as needed  for nausea/vomiting.     oxyCODONE (OXY IR/ROXICODONE) 5 MG immediate release tablet Take 1 tablet (5 mg total) by mouth every 4 (four) hours as needed. 30 tablet 0   polyethylene glycol (MIRALAX / GLYCOLAX) packet Take 17 g by mouth daily as needed for mild constipation or moderate constipation.     pregabalin (LYRICA) 75 MG capsule Take 75 mg by mouth 2 (two) times daily.     promethazine (PHENERGAN) 25 MG tablet Take 1 tablet (25 mg total) by mouth every 6 (six) hours as needed for nausea or vomiting. 30 tablet 0   venlafaxine XR (EFFEXOR-XR) 150 MG 24 hr capsule Take 150 mg by mouth daily with breakfast.     vitamin B-12 (CYANOCOBALAMIN) 1000 MCG tablet Take 1,000 mcg by mouth daily.     XTAMPZA ER 9 MG C12A Take 9 mg by mouth every 12 (twelve) hours.  0   Allergies  Allergen Reactions   Enoxaparin Nausea Only   Paroxetine Hcl     Other reaction(s): Other Hallucinations and agitation   Amoxicillin Other (See Comments)    Blisters all over Cannot take any 'cillins' at all   Demerol [Meperidine] Nausea And Vomiting and Other (See Comments)    Can not tolerate Iv administration Can tolerate IM adminstration   Haldol [Haloperidol] Other (See Comments)    Reverse effect   Imitrex [Sumatriptan] Other (See Comments)    Severity unknown   Macrobid [Nitrofurantoin] Nausea And Vomiting   Other Itching and Other (See Comments)    Pink NG tape   Penicillins Other (See Comments)    Cannot not tolerate any cillins due to amoxicillin reaction    Buprenorphine Rash   Depacon [Valproic Acid] Nausea And Vomiting and Rash   Depakote [Divalproex Sodium] Nausea And Vomiting and Rash   Dihydroergotamine Nausea And Vomiting and Rash   Metoclopramide Nausea And Vomiting, Rash and Other (See Comments)    React to IV form Can tolerate oral medication   Morphine And Codeine Rash and Other (See Comments)    IV form   Active Problems:   * No active hospital problems. *  Vitals: There were no  vitals taken for this visit. Lab results:No results found for this or any previous visit (from the past 24 hours). Radiology Results: No results found. General appearance: alert, cooperative, and moderately obese Head: Normocephalic, without obvious abnormality, atraumatic Eyes: negative Nose: Nares normal. Septum midline. Mucosa normal. No drainage or sinus tenderness. Throat: Edentulous maxilla and mandible.  Cover screw implant #4 visible right maxilla. No purulence, edema, fluctuance, trismus. Pharynx clear.  Neck: no adenopathy  Assessment: Edentulous maxilla and mandible. Implants present at 4, 6, 13, 20, 22, 27.   Plan: Bone graft left anterior maxilla.  Day surgery   Ocie Doyne 07/12/2023

## 2023-07-14 ENCOUNTER — Other Ambulatory Visit: Payer: Self-pay

## 2023-07-14 ENCOUNTER — Encounter (HOSPITAL_COMMUNITY): Payer: Self-pay | Admitting: Oral Surgery

## 2023-07-14 NOTE — Pre-Procedure Instructions (Signed)
-------------    SDW INSTRUCTIONS given:  Your procedure is scheduled on 4/11.  Report to [facility] Main Entrance "A" at 05:30 A.M., and check in at the Admitting office.  Any questions or running late day of surgery: call [phone]    Remember:  Do not eat or drink after midnight the night before your surgery     Take these medicines the morning of surgery with A SIP OF WATER  zyrtec, clozapine, cardura, levothyroxine, lo-loestrin, venlafaxine        May take these medicines IF NEEDED: albuterol, flexeril, bentyl, flonase, norco, zyprexa, zofran, phenergan    DO NOT take Metformin on day of surgery.  As of today, STOP taking any Aspirin (unless otherwise instructed by your surgeon) Aleve, Naproxen, Ibuprofen, Motrin, Advil, Goody's, BC's, all herbal medications, fish oil, and all vitamins. This includes Voltaren gel.    Do NOT Smoke (Tobacco/Vaping) 24 hours prior to your procedure  If you use a CPAP at night, you may bring all equipment for your overnight stay.     You will be asked to remove any contacts, glasses, piercing's, hearing aid's, dentures/partials prior to surgery. Please bring cases for these items if needed.     Patients discharged the day of surgery will not be allowed to drive home, and someone needs to stay with them for 24 hours.  SURGICAL WAITING ROOM VISITATION Patients may have no more than 2 support people in the waiting area - these visitors may rotate.   Pre-op nurse will coordinate an appropriate time for 1 ADULT support person, who may not rotate, to accompany patient in pre-op.  Children under the age of 84 must have an adult with them who is not the patient and must remain in the main waiting area with an adult.  If the patient needs to stay at the hospital during part of their recovery, the visitor guidelines for inpatient rooms apply.  Please refer to the [facility] website for the visitor guidelines for any additional  information.   Special instructions:   [place]- Preparing For Surgery   Please follow these instructions carefully.   Shower the NIGHT BEFORE SURGERY and the MORNING OF SURGERY with DIAL Soap.   Pat yourself dry with a CLEAN TOWEL.  Wear CLEAN PAJAMAS to bed the night before surgery  Place CLEAN SHEETS on your bed the night of your first shower and DO NOT SLEEP WITH PETS.   Additional instructions for the day of surgery: DO NOT APPLY any lotions, deodorants, cologne, or perfumes.   Do not wear jewelry or makeup Do not wear nail polish, gel polish, artificial nails, or any other type of covering on natural nails (fingers and toes) Do not bring valuables to the hospital. [facility] is not responsible for valuables/personal belongings. Put on clean/comfortable clothes.  Please brush your teeth.  Ask your nurse before applying any prescription medications to the skin.

## 2023-07-14 NOTE — Progress Notes (Signed)
 PCP - Percell Locus, PA-C Cardiologist - Dr. Lorelei Pont  PPM/ICD - denies   Chest x-ray - 03/23/22- CE EKG - DOS Stress Test - denies ECHO - 10/09/21 Cardiac Cath - denies  CPAP - denies  DM- denies, pt's mother states that she takes metformin only to control weight with the medications she takes  ASA/Blood Thinner Instructions: n/a   ERAS Protcol - no, NPO  COVID TEST- n/a  Anesthesia review: yes, cardiac hx (sees cardiology r/t multiple endocrine neoplasia per mother)  Patient's mother verbally denies pt has any shortness of breath, fever, cough and chest pain during phone call      Questions were answered for pt's mother. [name] verbalized understanding of instructions.

## 2023-07-14 NOTE — Progress Notes (Signed)
 Anesthesia Chart Review: SAME DAY WORK-UP   Case: [id number] Date/Time: 07/15/23 0700   Procedure: DENTAL RESTORATION/EXTRACTIONS   Anesthesia type: General   Diagnosis: Nonrestorable tooth [K08.89]   Pre-op diagnosis: Nonrestorable   [facility] ROOM 04 / [facility]   Surgeons: Ocie Doyne, DMD       DISCUSSION: Patient is a 38 year old female scheduled for the above procedure.   History includes never smoker, post-operative N/V, Bipolar disorder, MEN1 multiple endocrine neoplasia, Hashimoto's thyroiditis (2005),  papillary thyroid cancer (s/p thyroidectomy 2014, post surgical hypothyroidism), hyperparathyroidism (s/p 1 gland parathyroidectomy), pituitary adenoma, PCOS, HTN, sinus tachycardia, appendectomy, mandibular fracture (s/p ORIF), obesity.  Last cardiology visit with Dr. Willeen Cass was on 12/08/22 for follow-up HTN and palpitations. She was doing well from a cardiac standpoint. Occasional edema, so torsemide 10 mg daily as needed added. 9 month follow-up planned.   Reported being on metformin "to control weight".   Anesthesia team to evaluate on the day of surgery.   VS: Ht 5' 2.5" (1.588 m)   Wt 102.1 kg   LMP 07/07/2023 (Approximate)   BMI 40.50 kg/m   PROVIDERS: [facility], clinical [facility] PCP - Percell Locus, PA-C Cardiologist - Dr. Lorelei Pont Endocrinologist - Dr. Otho Darner  LABS: For day of surgery. WBC 7.4, H/H 12.3/38.5, PLT 245 on 06/08/23. CMP on 10/01/22 showed Cr 0.97, glucose 88, AST 7, ALT 10.   EKG: EKG 10/09/21: Per Narrative in [facility] CE: Sinus  Rhythm  Non specific ST-T changes noted  ABNORMAL   CV: Echo 10/09/21: Left Ventricle: The calculated left ventricular ejection fraction is  60%.   Left Ventricle: Wall thickness is normal.    Left Ventricle: Systolic function is normal. EF: 55-60%.    Left Ventricle: Wall motion is normal.    Left Ventricle: Doppler parameters indicate normal diastolic function.    Pericardium: There is  no pericardial effusion.   Past Medical History:  Diagnosis Date   Ankle fracture    right   Bipolar disorder (HCC)    Cancer (HCC)    THYROID CANCER   Chronic pain disorder    Depression    Dysrhythmia    SURGES TACHYCARDIA     Family history of adverse reaction to anesthesia    mother also has nausea    Fibromyalgia    GERD (gastroesophageal reflux disease)    Hashimoto's thyroiditis    2005ish   History of kidney stones    2013+2017   Hyperparathyroidism (HCC)    Hyperparathyroidism, unspecified (HCC)    Hypertension    Hypothyroidism    Ileus (HCC)    x3-4   MEN 1 (multiple endocrine neoplasia) (HCC)    Migraines    occasional aura   Partial edentulism    Pituitary adenoma (HCC)    Polycystic disease, ovaries    PONV (postoperative nausea and vomiting)    Psychosis (HCC)    ORGANIC PSYCHOSIS     Torticollis     Past Surgical History:  Procedure Laterality Date   appendicitis     2003   DENTAL SURGERY     MULT   DENTAL SURGERY  11/19/2016   DILATION AND CURETTAGE OF UTERUS         1999    TOTAL 3   LAPAROSCOPY     MANDIBULAR HARDWARE REMOVAL N/A 07/23/2021   Procedure: REMOVING OF FAILED IMPLANT, PLACEMENT OF DENTAL IMPLANT;  Surgeon: Ocie Doyne, DMD;  Location: [facility];  Service: Oral Surgery;  Laterality: N/A;   ORIF ANKLE FRACTURE Right 12/15/2016   ORIF ANKLE FRACTURE Right 12/15/2016   Procedure: OPEN REDUCTION INTERNAL FIXATION (ORIF) ANKLE FRACTURE;  Surgeon: Bjorn Pippin, MD;  Location: [facility] OR;  Service: Orthopedics;  Laterality: Right;   ORIF MANDIBULAR FRACTURE N/A 11/19/2016   Procedure: Extraction (teeth numbers seven, eight, nine); Alveoloplasty; Placement maxillary and mandibular implants;  Surgeon: Ocie Doyne, DDS;  Location[facility] OR;  Service: Oral Surgery;  Laterality: N/A;   PARATHYROIDECTOMY     SYNDESMOSIS REPAIR Right 12/15/2016   Procedure: SYNDESMOSIS REPAIR;  Surgeon: Bjorn Pippin, MD;  Location: [facility] OR;  Service: Orthopedics;   Laterality: Right;   THYROIDECTOMY      MEDICATIONS: No current facility-administered medications for this encounter.    albuterol (PROVENTIL HFA;VENTOLIN HFA) 108 (90 Base) MCG/ACT inhaler   buprenorphine (BUTRANS) 15 MCG/HR   calcium carbonate (OS-CAL) 600 MG TABS tablet   cetirizine (ZYRTEC) 10 MG tablet   chlorhexidine (PERIDEX) 0.12 % solution   cholecalciferol (VITAMIN D) 1000 UNITS tablet   clonazePAM (KLONOPIN) 0.5 MG tablet   cloZAPine (CLOZARIL) 100 MG tablet   cloZAPine (CLOZARIL) 25 MG tablet   cyclobenzaprine (FLEXERIL) 10 MG tablet   diclofenac Sodium (VOLTAREN) 1 % GEL   dicyclomine (BENTYL) 10 MG capsule   doxazosin (CARDURA) 2 MG tablet   ferrous sulfate 325 (65 FE) MG EC tablet   fluticasone (CUTIVATE) 0.05 % cream   fluticasone (FLONASE) 50 MCG/ACT nasal spray   HYDROcodone-acetaminophen (NORCO) 7.5-325 MG tablet   ibuprofen (ADVIL) 600 MG tablet   lamoTRIgine (LAMICTAL) 100 MG tablet   levothyroxine (SYNTHROID, LEVOTHROID) 112 MCG tablet   lidocaine (XYLOCAINE) 5 % ointment   metFORMIN (GLUCOPHAGE-XR) 500 MG 24 hr tablet   metoprolol succinate (TOPROL-XL) 100 MG 24 hr tablet   Norethindrone-Ethinyl Estradiol-Fe Biphas (LO LOESTRIN FE) 1 MG-10 MCG / 10 MCG tablet   nystatin cream (MYCOSTATIN)   NYSTATIN powder   OLANZapine (ZYPREXA) 5 MG tablet   ondansetron (ZOFRAN) 4 MG tablet   polyethylene glycol (MIRALAX / GLYCOLAX) packet   promethazine (PHENERGAN) 25 MG tablet   triamcinolone cream (KENALOG) 0.1 %   venlafaxine XR (EFFEXOR-XR) 150 MG 24 hr capsule   vitamin B-12 (CYANOCOBALAMIN) 1000 MCG tablet    Shonna Chock, PA-C Surgical Short Stay/Anesthesiology [facility] Phone [phone] [facility] Phone [phone] 07/14/2023 2:48 PM

## 2023-07-14 NOTE — Anesthesia Preprocedure Evaluation (Signed)
 Anesthesia Evaluation  Patient identified by MRN, date of birth, ID band Patient awake    Reviewed: Allergy & Precautions, H&P , NPO status , Patient's Chart, lab work & pertinent test results  History of Anesthesia Complications (+) PONV and history of anesthetic complications  Airway Mallampati: II   Neck ROM: full    Dental   Pulmonary neg pulmonary ROS   breath sounds clear to auscultation       Cardiovascular hypertension,  Rhythm:regular Rate:Normal     Neuro/Psych  Headaches PSYCHIATRIC DISORDERS  Depression Bipolar Disorder    Neuromuscular disease    GI/Hepatic ,GERD  ,,  Endo/Other  Hypothyroidism    Renal/GU      Musculoskeletal  (+)  Fibromyalgia -  Abdominal   Peds  Hematology   Anesthesia Other Findings   Reproductive/Obstetrics                             Anesthesia Physical Anesthesia Plan  ASA: 3  Anesthesia Plan: General   Post-op Pain Management:    Induction: Intravenous  PONV Risk Score and Plan: 4 or greater and Ondansetron, Dexamethasone, Midazolam and Treatment may vary due to age or medical condition  Airway Management Planned: Nasal ETT  Additional Equipment:   Intra-op Plan:   Post-operative Plan: Extubation in OR  Informed Consent: I have reviewed the patients History and Physical, chart, labs and discussed the procedure including the risks, benefits and alternatives for the proposed anesthesia with the patient or authorized representative who has indicated his/her understanding and acceptance.     Dental advisory given  Plan Discussed with: CRNA, Anesthesiologist and Surgeon  Anesthesia Plan Comments: (PAT note written 07/14/2023 by Shonna Chock, PA-C.  )       Anesthesia Quick Evaluation

## 2023-07-15 ENCOUNTER — Encounter (HOSPITAL_COMMUNITY)
Admission: RE | Disposition: A | Discharge status: Home / Self Care | Payer: Self-pay | Source: Home / Self Care | Attending: Oral Surgery

## 2023-07-15 ENCOUNTER — Other Ambulatory Visit: Payer: Self-pay

## 2023-07-15 ENCOUNTER — Observation Stay (HOSPITAL_COMMUNITY)
Admission: RE | Admit: 2023-07-15 | Discharge: 2023-07-16 | Disposition: A | Discharge status: Home / Self Care | Attending: Oral Surgery | Admitting: Oral Surgery

## 2023-07-15 ENCOUNTER — Ambulatory Visit (HOSPITAL_BASED_OUTPATIENT_CLINIC_OR_DEPARTMENT_OTHER): Payer: Self-pay | Admitting: Vascular Surgery

## 2023-07-15 ENCOUNTER — Ambulatory Visit (HOSPITAL_COMMUNITY): Payer: Self-pay | Admitting: Vascular Surgery

## 2023-07-15 DIAGNOSIS — Z9889 Other specified postprocedural states: Principal | ICD-10-CM

## 2023-07-15 DIAGNOSIS — I10 Essential (primary) hypertension: Secondary | ICD-10-CM

## 2023-07-15 DIAGNOSIS — K0889 Other specified disorders of teeth and supporting structures: Secondary | ICD-10-CM | POA: Diagnosis not present

## 2023-07-15 DIAGNOSIS — E039 Hypothyroidism, unspecified: Secondary | ICD-10-CM | POA: Insufficient documentation

## 2023-07-15 DIAGNOSIS — M2769 Other endosseous dental implant failure: Secondary | ICD-10-CM | POA: Diagnosis not present

## 2023-07-15 DIAGNOSIS — Z7984 Long term (current) use of oral hypoglycemic drugs: Secondary | ICD-10-CM | POA: Insufficient documentation

## 2023-07-15 DIAGNOSIS — I517 Cardiomegaly: Secondary | ICD-10-CM | POA: Diagnosis not present

## 2023-07-15 DIAGNOSIS — K0820 Unspecified atrophy of edentulous alveolar ridge: Principal | ICD-10-CM | POA: Insufficient documentation

## 2023-07-15 DIAGNOSIS — Z79899 Other long term (current) drug therapy: Secondary | ICD-10-CM | POA: Diagnosis not present

## 2023-07-15 DIAGNOSIS — Z85850 Personal history of malignant neoplasm of thyroid: Secondary | ICD-10-CM | POA: Insufficient documentation

## 2023-07-15 HISTORY — PX: HARVEST BONE GRAFT: SHX377

## 2023-07-15 LAB — BASIC METABOLIC PANEL WITH GFR
Anion gap: 7 (ref 5–15)
BUN: 12 mg/dL (ref 6–20)
CO2: 26 mmol/L (ref 22–32)
Calcium: 8.6 mg/dL — ABNORMAL LOW (ref 8.9–10.3)
Chloride: 106 mmol/L (ref 98–111)
Creatinine, Ser: 1.04 mg/dL — ABNORMAL HIGH (ref 0.44–1.00)
GFR, Estimated: >60 mL/min (ref >60)
Glucose, Bld: 110 mg/dL — ABNORMAL HIGH (ref 70–99)
Potassium: 3.5 mmol/L (ref 3.5–5.1)
Sodium: 139 mmol/L (ref 135–145)

## 2023-07-15 LAB — CBC
HCT: 35 % — ABNORMAL LOW (ref 36.0–46.0)
Hemoglobin: 11.1 g/dL — ABNORMAL LOW (ref 12.0–15.0)
MCH: 26.1 pg (ref 26.0–34.0)
MCHC: 31.7 g/dL (ref 30.0–36.0)
MCV: 82.4 fL (ref 80.0–100.0)
Platelets: 221 10*3/uL (ref 150–400)
RBC: 4.25 MIL/uL (ref 3.87–5.11)
RDW: 14 % (ref 11.5–15.5)
WBC: 6.4 10*3/uL (ref 4.0–10.5)
nRBC: 0 % (ref 0.0–0.2)

## 2023-07-15 LAB — HIV ANTIBODY (ROUTINE TESTING W REFLEX): HIV Screen 4th Generation wRfx: NONREACTIVE

## 2023-07-15 LAB — POCT PREGNANCY, URINE: Preg Test, Ur: NEGATIVE

## 2023-07-15 SURGERY — PROCEDURE, BONE GRAFT
Anesthesia: General | Site: Mouth | Laterality: Left

## 2023-07-15 MED ORDER — PROPOFOL 500 MG/50ML IV EMUL
INTRAVENOUS | Status: DC | PRN
  Administered 2023-07-15 (×2): 150 ug/kg/min via INTRAVENOUS
  Administered 2023-07-15: 125 ug/kg/min via INTRAVENOUS
  Administered 2023-07-15: 150 ug/kg/min via INTRAVENOUS

## 2023-07-15 MED ORDER — OXYMETAZOLINE HCL 0.05 % NA SOLN
NASAL | Status: AC
  Filled 2023-07-15: qty 30

## 2023-07-15 MED ORDER — BUPRENORPHINE 15 MCG/HR TD PTWK
1.0000 | MEDICATED_PATCH | TRANSDERMAL | Status: DC
  Filled 2023-07-15: qty 1

## 2023-07-15 MED ORDER — CALCIUM CARBONATE 600 MG PO TABS: 2.0000 | ORAL_TABLET | Freq: Every day | ORAL | Status: DC

## 2023-07-15 MED ORDER — OLANZAPINE 5 MG PO TABS: 5.0000 mg | ORAL_TABLET | Freq: Two times a day (BID) | ORAL | Status: DC | PRN

## 2023-07-15 MED ORDER — HYDROMORPHONE HCL 1 MG/ML IJ SOLN
0.5000 mg | INTRAMUSCULAR | Status: DC | PRN
  Administered 2023-07-15: 0.5 mg via INTRAVENOUS

## 2023-07-15 MED ORDER — CLOZAPINE 25 MG PO TABS: 150.0000 mg | ORAL_TABLET | ORAL | Status: DC

## 2023-07-15 MED ORDER — FERROUS SULFATE 325 (65 FE) MG PO TABS
325.0000 mg | ORAL_TABLET | ORAL | Status: DC
Start: 2023-07-16 — End: 2023-07-16
  Administered 2023-07-16: 325 mg via ORAL
  Filled 2023-07-15 (×2): qty 1

## 2023-07-15 MED ORDER — CLINDAMYCIN PHOSPHATE 900 MG/50ML IV SOLN
INTRAVENOUS | Status: AC
  Filled 2023-07-15: qty 50

## 2023-07-15 MED ORDER — METOPROLOL TARTRATE 5 MG/5ML IV SOLN: 5.0000 mg | Freq: Four times a day (QID) | INTRAVENOUS | Status: DC | PRN

## 2023-07-15 MED ORDER — METOPROLOL SUCCINATE ER 100 MG PO TB24
100.0000 mg | ORAL_TABLET | Freq: Every day | ORAL | Status: DC
  Administered 2023-07-15: 100 mg via ORAL
  Filled 2023-07-15: qty 1

## 2023-07-15 MED ORDER — ONDANSETRON HCL 4 MG/2ML IJ SOLN
INTRAMUSCULAR | Status: DC | PRN
  Administered 2023-07-15: 4 mg via INTRAVENOUS

## 2023-07-15 MED ORDER — VENLAFAXINE HCL ER 150 MG PO CP24
150.0000 mg | ORAL_CAPSULE | Freq: Every day | ORAL | Status: DC
  Administered 2023-07-16: 150 mg via ORAL
  Filled 2023-07-15: qty 2
  Filled 2023-07-15: qty 1
  Filled 2023-07-15: qty 2

## 2023-07-15 MED ORDER — OXYCODONE HCL 5 MG PO TABS
5.0000 mg | ORAL_TABLET | ORAL | Status: DC | PRN
  Filled 2023-07-15: qty 2

## 2023-07-15 MED ORDER — MIDAZOLAM HCL 2 MG/2ML IJ SOLN
INTRAMUSCULAR | Status: AC
  Filled 2023-07-15: qty 2

## 2023-07-15 MED ORDER — SUGAMMADEX SODIUM 200 MG/2ML IV SOLN
INTRAVENOUS | Status: DC | PRN
  Administered 2023-07-15: 200 mg via INTRAVENOUS

## 2023-07-15 MED ORDER — CLOZAPINE 25 MG PO TABS: 25.0000 mg | ORAL_TABLET | Freq: Every morning | ORAL | Status: DC

## 2023-07-15 MED ORDER — POLYETHYLENE GLYCOL 3350 17 G PO PACK: 17.0000 g | PACK | Freq: Every day | ORAL | Status: DC | PRN

## 2023-07-15 MED ORDER — PROPOFOL 10 MG/ML IV BOLUS
INTRAVENOUS | Status: AC
  Filled 2023-07-15: qty 20

## 2023-07-15 MED ORDER — ONDANSETRON HCL 4 MG/2ML IJ SOLN
INTRAMUSCULAR | Status: AC
  Filled 2023-07-15: qty 2

## 2023-07-15 MED ORDER — MIDAZOLAM HCL 5 MG/5ML IJ SOLN
INTRAMUSCULAR | Status: DC | PRN
  Administered 2023-07-15: 2 mg via INTRAVENOUS

## 2023-07-15 MED ORDER — SODIUM CHLORIDE 0.9 % IR SOLN
Status: DC | PRN
  Administered 2023-07-15: 1000 mL

## 2023-07-15 MED ORDER — LIDOCAINE-EPINEPHRINE 2 %-1:100000 IJ SOLN
INTRAMUSCULAR | Status: DC | PRN
  Administered 2023-07-15: 10 mL

## 2023-07-15 MED ORDER — LAMOTRIGINE 100 MG PO TABS
200.0000 mg | ORAL_TABLET | Freq: Every day | ORAL | Status: DC
  Administered 2023-07-15: 200 mg via ORAL
  Filled 2023-07-15: qty 2

## 2023-07-15 MED ORDER — LIDOCAINE HCL URETHRAL/MUCOSAL 2 % EX GEL
CUTANEOUS | Status: AC
  Filled 2023-07-15: qty 22

## 2023-07-15 MED ORDER — LIDOCAINE HCL (CARDIAC) PF 100 MG/5ML IV SOSY
PREFILLED_SYRINGE | INTRAVENOUS | Status: DC | PRN
  Administered 2023-07-15: 2.5 mL via INTRATRACHEAL

## 2023-07-15 MED ORDER — CALCIUM CARBONATE 1250 (500 CA) MG PO TABS
2.0000 | ORAL_TABLET | Freq: Every day | ORAL | Status: DC
  Administered 2023-07-16: 2500 mg via ORAL
  Filled 2023-07-15: qty 2

## 2023-07-15 MED ORDER — FENTANYL CITRATE (PF) 250 MCG/5ML IJ SOLN
INTRAMUSCULAR | Status: DC | PRN
  Administered 2023-07-15 (×3): 50 ug via INTRAVENOUS
  Administered 2023-07-15: 100 ug via INTRAVENOUS

## 2023-07-15 MED ORDER — CYCLOBENZAPRINE HCL 10 MG PO TABS: 10.0000 mg | ORAL_TABLET | Freq: Three times a day (TID) | ORAL | Status: DC | PRN

## 2023-07-15 MED ORDER — HYDROMORPHONE HCL 1 MG/ML IJ SOLN
1.0000 mg | INTRAMUSCULAR | Status: DC | PRN
  Administered 2023-07-15 – 2023-07-16 (×7): 1 mg via INTRAVENOUS
  Filled 2023-07-15 (×7): qty 1

## 2023-07-15 MED ORDER — TRIAMCINOLONE ACETONIDE 0.1 % EX CREA: 1.0000 | TOPICAL_CREAM | Freq: Every day | CUTANEOUS | Status: DC | PRN

## 2023-07-15 MED ORDER — TRIAMCINOLONE ACETONIDE 0.1 % EX CREA: 1.0000 | TOPICAL_CREAM | Freq: Two times a day (BID) | CUTANEOUS | Status: DC | PRN

## 2023-07-15 MED ORDER — CLOZAPINE 25 MG PO TABS
175.0000 mg | ORAL_TABLET | Freq: Every day | ORAL | Status: DC
  Administered 2023-07-16: 175 mg via ORAL
  Filled 2023-07-15: qty 3

## 2023-07-15 MED ORDER — CLONAZEPAM 0.25 MG PO TBDP
0.2500 mg | ORAL_TABLET | Freq: Every day | ORAL | Status: DC
  Administered 2023-07-15: 0.25 mg via ORAL
  Filled 2023-07-15: qty 1

## 2023-07-15 MED ORDER — ONDANSETRON HCL 4 MG/2ML IJ SOLN: INTRAMUSCULAR | Status: DC | PRN

## 2023-07-15 MED ORDER — ACETAMINOPHEN 10 MG/ML IV SOLN
INTRAVENOUS | Status: AC
Start: 2023-07-15 — End: 2023-07-15
  Filled 2023-07-15: qty 100

## 2023-07-15 MED ORDER — HYDROMORPHONE HCL 1 MG/ML IJ SOLN
INTRAMUSCULAR | Status: AC
  Administered 2023-07-15: 0.5 mg via INTRAVENOUS
  Filled 2023-07-15: qty 1

## 2023-07-15 MED ORDER — SODIUM CHLORIDE 0.9% FLUSH
3.0000 mL | Freq: Two times a day (BID) | INTRAVENOUS | Status: DC
  Administered 2023-07-15 – 2023-07-16 (×2): 10 mL via INTRAVENOUS

## 2023-07-15 MED ORDER — ONDANSETRON HCL 4 MG/2ML IJ SOLN
4.0000 mg | Freq: Four times a day (QID) | INTRAMUSCULAR | Status: DC | PRN
Start: 2023-07-15 — End: 2023-07-16
  Administered 2023-07-15 – 2023-07-16 (×3): 4 mg via INTRAVENOUS
  Filled 2023-07-15 (×3): qty 2

## 2023-07-15 MED ORDER — 0.9 % SODIUM CHLORIDE (POUR BTL) OPTIME
TOPICAL | Status: DC | PRN
  Administered 2023-07-15: 1000 mL

## 2023-07-15 MED ORDER — LACTATED RINGERS IV SOLN: INTRAVENOUS | Status: DC

## 2023-07-15 MED ORDER — ACETAMINOPHEN 10 MG/ML IV SOLN
1000.0000 mg | Freq: Four times a day (QID) | INTRAVENOUS | Status: DC
  Administered 2023-07-15: 1000 mg via INTRAVENOUS

## 2023-07-15 MED ORDER — ONDANSETRON HCL 4 MG/2ML IJ SOLN: 4.0000 mg | Freq: Four times a day (QID) | INTRAMUSCULAR | Status: DC | PRN

## 2023-07-15 MED ORDER — FENTANYL CITRATE (PF) 250 MCG/5ML IJ SOLN
INTRAMUSCULAR | Status: AC
  Filled 2023-07-15: qty 5

## 2023-07-15 MED ORDER — DICYCLOMINE HCL 10 MG PO CAPS: 10.0000 mg | ORAL_CAPSULE | Freq: Every day | ORAL | Status: DC | PRN

## 2023-07-15 MED ORDER — LABETALOL HCL 5 MG/ML IV SOLN: 10.0000 mg | Freq: Once | INTRAVENOUS | Status: DC

## 2023-07-15 MED ORDER — DOXAZOSIN MESYLATE 2 MG PO TABS
2.0000 mg | ORAL_TABLET | Freq: Every day | ORAL | Status: DC
  Administered 2023-07-15 – 2023-07-16 (×2): 2 mg via ORAL
  Filled 2023-07-15 (×2): qty 1

## 2023-07-15 MED ORDER — ROCURONIUM BROMIDE 10 MG/ML (PF) SYRINGE
PREFILLED_SYRINGE | INTRAVENOUS | Status: AC
  Filled 2023-07-15: qty 10

## 2023-07-15 MED ORDER — LIDOCAINE HCL URETHRAL/MUCOSAL 2 % EX GEL
CUTANEOUS | Status: AC
  Filled 2023-07-15: qty 11

## 2023-07-15 MED ORDER — PHENYLEPHRINE HCL (PRESSORS) 10 MG/ML IV SOLN
INTRAVENOUS | Status: DC | PRN
  Administered 2023-07-15 (×2): 80 ug via INTRAVENOUS

## 2023-07-15 MED ORDER — FENTANYL CITRATE (PF) 100 MCG/2ML IJ SOLN
25.0000 ug | INTRAMUSCULAR | Status: DC | PRN
  Administered 2023-07-15 (×3): 25 ug via INTRAVENOUS

## 2023-07-15 MED ORDER — VITAMIN D 25 MCG (1000 UNIT) PO TABS
1000.0000 [IU] | ORAL_TABLET | Freq: Every day | ORAL | Status: DC
  Administered 2023-07-16: 1000 [IU] via ORAL
  Filled 2023-07-15 (×2): qty 1

## 2023-07-15 MED ORDER — ROCURONIUM BROMIDE 100 MG/10ML IV SOLN
INTRAVENOUS | Status: DC | PRN
Start: 2023-07-15 — End: 2023-07-15
  Administered 2023-07-15: 50 mg via INTRAVENOUS

## 2023-07-15 MED ORDER — LORATADINE 10 MG PO TABS
10.0000 mg | ORAL_TABLET | Freq: Every day | ORAL | Status: DC
  Administered 2023-07-16: 10 mg via ORAL
  Filled 2023-07-15: qty 1

## 2023-07-15 MED ORDER — NORETHIN-ETH ESTRAD-FE BIPHAS 1 MG-10 MCG / 10 MCG PO TABS: 1.0000 | ORAL_TABLET | Freq: Every day | ORAL | Status: DC

## 2023-07-15 MED ORDER — FENTANYL CITRATE (PF) 100 MCG/2ML IJ SOLN
INTRAMUSCULAR | Status: AC
Start: 2023-07-15 — End: 2023-07-15
  Administered 2023-07-15: 25 ug via INTRAVENOUS
  Filled 2023-07-15: qty 2

## 2023-07-15 MED ORDER — LIDOCAINE 2% (20 MG/ML) 5 ML SYRINGE
INTRAMUSCULAR | Status: AC
  Filled 2023-07-15: qty 5

## 2023-07-15 MED ORDER — LIDOCAINE-EPINEPHRINE 2 %-1:100000 IJ SOLN
INTRAMUSCULAR | Status: AC
  Filled 2023-07-15: qty 1

## 2023-07-15 MED ORDER — CHLORHEXIDINE GLUCONATE 0.12 % MT SOLN
15.0000 mL | Freq: Once | OROMUCOSAL | Status: AC
  Administered 2023-07-15: 15 mL via OROMUCOSAL

## 2023-07-15 MED ORDER — LEVOTHYROXINE SODIUM 112 MCG PO TABS
112.0000 ug | ORAL_TABLET | Freq: Every day | ORAL | Status: DC
  Administered 2023-07-16: 112 ug via ORAL
  Filled 2023-07-15: qty 1

## 2023-07-15 MED ORDER — CLINDAMYCIN PHOSPHATE 600 MG/50ML IV SOLN
600.0000 mg | INTRAVENOUS | Status: AC
  Administered 2023-07-15: 900 mg via INTRAVENOUS
  Filled 2023-07-15: qty 50

## 2023-07-15 MED ORDER — PROPOFOL 1000 MG/100ML IV EMUL
INTRAVENOUS | Status: AC
  Filled 2023-07-15: qty 300

## 2023-07-15 MED ORDER — ONDANSETRON 4 MG PO TBDP: 4.0000 mg | ORAL_TABLET | Freq: Four times a day (QID) | ORAL | Status: DC | PRN

## 2023-07-15 MED ORDER — METFORMIN HCL ER 500 MG PO TB24: 1000.0000 mg | ORAL_TABLET | Freq: Two times a day (BID) | ORAL | Status: DC

## 2023-07-15 MED ORDER — SODIUM CHLORIDE 0.9% FLUSH: 3.0000 mL | INTRAVENOUS | Status: DC | PRN

## 2023-07-15 MED ORDER — CHLORHEXIDINE GLUCONATE 0.12 % MT SOLN
15.0000 mL | Freq: Two times a day (BID) | OROMUCOSAL | Status: DC
  Administered 2023-07-15 – 2023-07-16 (×3): 15 mL via OROMUCOSAL
  Filled 2023-07-15 (×3): qty 15

## 2023-07-15 MED ORDER — VITAMIN B-12 1000 MCG PO TABS
1000.0000 ug | ORAL_TABLET | Freq: Every day | ORAL | Status: DC
  Administered 2023-07-16: 1000 ug via ORAL
  Filled 2023-07-15 (×2): qty 1

## 2023-07-15 MED ORDER — DIPHENHYDRAMINE HCL 12.5 MG/5ML PO ELIX: 12.5000 mg | ORAL_SOLUTION | Freq: Four times a day (QID) | ORAL | Status: DC | PRN

## 2023-07-15 MED ORDER — CLOZAPINE 100 MG PO TABS
200.0000 mg | ORAL_TABLET | Freq: Every day | ORAL | Status: DC
  Filled 2023-07-15: qty 2

## 2023-07-15 MED ORDER — CLOZAPINE 25 MG PO TABS
150.0000 mg | ORAL_TABLET | Freq: Every day | ORAL | Status: DC
  Administered 2023-07-15: 150 mg via ORAL
  Filled 2023-07-15 (×2): qty 2

## 2023-07-15 MED ORDER — FLUTICASONE PROPIONATE 50 MCG/ACT NA SUSP: 1.0000 | Freq: Every day | NASAL | Status: DC | PRN

## 2023-07-15 MED ORDER — ORAL CARE MOUTH RINSE: 15.0000 mL | Freq: Once | OROMUCOSAL | Status: AC

## 2023-07-15 MED ORDER — ACETAMINOPHEN 500 MG PO TABS
1000.0000 mg | ORAL_TABLET | Freq: Four times a day (QID) | ORAL | Status: DC
  Administered 2023-07-15 – 2023-07-16 (×2): 1000 mg via ORAL
  Filled 2023-07-15 (×2): qty 2

## 2023-07-15 MED ORDER — OXYMETAZOLINE HCL 0.05 % NA SOLN
NASAL | Status: AC
  Filled 2023-07-15: qty 60

## 2023-07-15 MED ORDER — DIPHENHYDRAMINE HCL 50 MG/ML IJ SOLN: 12.5000 mg | Freq: Four times a day (QID) | INTRAMUSCULAR | Status: DC | PRN

## 2023-07-15 MED ORDER — LIDOCAINE 5 % EX OINT: 1.0000 | TOPICAL_OINTMENT | Freq: Every day | CUTANEOUS | Status: DC | PRN

## 2023-07-15 MED ORDER — OXYCODONE HCL 5 MG/5ML PO SOLN: 5.0000 mg | Freq: Once | ORAL | Status: AC | PRN

## 2023-07-15 MED ORDER — DEXMEDETOMIDINE HCL IN NACL 80 MCG/20ML IV SOLN
INTRAVENOUS | Status: DC | PRN
Start: 2023-07-15 — End: 2023-07-15
  Administered 2023-07-15: 20 ug via INTRAVENOUS

## 2023-07-15 MED ORDER — OXYCODONE HCL 5 MG PO TABS
5.0000 mg | ORAL_TABLET | Freq: Once | ORAL | Status: AC | PRN
  Administered 2023-07-15: 5 mg via ORAL

## 2023-07-15 MED ORDER — SUCCINYLCHOLINE CHLORIDE 200 MG/10ML IV SOSY
PREFILLED_SYRINGE | INTRAVENOUS | Status: AC
Start: 2023-07-15 — End: ?
  Filled 2023-07-15: qty 10

## 2023-07-15 MED ORDER — OXYCODONE HCL 5 MG PO TABS
ORAL_TABLET | ORAL | Status: AC
  Filled 2023-07-15: qty 1

## 2023-07-15 MED ORDER — DEXAMETHASONE SODIUM PHOSPHATE 10 MG/ML IJ SOLN
INTRAMUSCULAR | Status: AC
  Filled 2023-07-15: qty 1

## 2023-07-15 MED ORDER — PHENYLEPHRINE 80 MCG/ML (10ML) SYRINGE FOR IV PUSH (FOR BLOOD PRESSURE SUPPORT)
PREFILLED_SYRINGE | INTRAVENOUS | Status: AC
  Filled 2023-07-15: qty 10

## 2023-07-15 SURGICAL SUPPLY — 29 items
BAG COUNTER SPONGE SURGICOUNT (BAG) IMPLANT
BLADE SURG 15 STRL LF DISP TIS (BLADE) ×3 IMPLANT
BUR CROSS CUT FISSURE 1.6 (BURR) ×3 IMPLANT
BUR EGG ELITE 4.0 (BURR) ×1 IMPLANT
CANISTER SUCT 3000ML PPV (MISCELLANEOUS) ×3 IMPLANT
COVER SURGICAL LIGHT HANDLE (MISCELLANEOUS) ×3 IMPLANT
GAUZE PACKING FOLDED 2 STR (GAUZE/BANDAGES/DRESSINGS) ×3 IMPLANT
GLOVE BIO SURGEON STRL SZ8 (GLOVE) ×4 IMPLANT
GOWN STRL REUS W/ TWL LRG LVL3 (GOWN DISPOSABLE) ×3 IMPLANT
GOWN STRL REUS W/ TWL XL LVL3 (GOWN DISPOSABLE) ×3 IMPLANT
IV NS 1000ML BAXH (IV SOLUTION) ×3 IMPLANT
KIT BASIN OR (CUSTOM PROCEDURE TRAY) ×3 IMPLANT
KIT TURNOVER KIT B (KITS) ×3 IMPLANT
NDL HYPO 25GX1X1/2 BEV (NEEDLE) ×4 IMPLANT
NEEDLE HYPO 25GX1X1/2 BEV (NEEDLE) ×4 IMPLANT
NS IRRIG 1000ML POUR BTL (IV SOLUTION) ×3 IMPLANT
PAD ARMBOARD POSITIONER FOAM (MISCELLANEOUS) ×3 IMPLANT
PUTTY BONE DBX 2.5 MIS (Bone Implant) ×1 IMPLANT
SCREW SELF DRILL HT 1.5/4MM (Screw) ×2 IMPLANT
SLEEVE IRRIGATION ELITE 7 (MISCELLANEOUS) ×3 IMPLANT
SPIKE FLUID TRANSFER (MISCELLANEOUS) IMPLANT
SUT CHROMIC 3 0 SH 27 (SUTURE) ×1 IMPLANT
SUT PLAIN 3 0 PS2 27 (SUTURE) IMPLANT
SUT PROLENE 3 0 PS 2 (SUTURE) ×2 IMPLANT
SYR BULB IRRIG 60ML STRL (SYRINGE) ×2 IMPLANT
SYR CONTROL 10ML LL (SYRINGE) ×2 IMPLANT
TRAY ENT MC OR (CUSTOM PROCEDURE TRAY) ×3 IMPLANT
TUBING IRRIGATION (MISCELLANEOUS) ×3 IMPLANT
YANKAUER SUCT BULB TIP NO VENT (SUCTIONS) ×3 IMPLANT

## 2023-07-15 NOTE — Plan of Care (Signed)

## 2023-07-15 NOTE — Op Note (Signed)
 Ashlee Day, Ashlee Day MEDICAL RECORD NO: [id number] ACCOUNT NO: [id number] DATE OF BIRTH: Jul 07, 1985 FACILITY: [facility] LOCATION: [facility] PHYSICIAN: Cornelia Dieter, DDS  Operative Report   DATE OF PROCEDURE: 07/15/2023  PREOPERATIVE DIAGNOSIS:  Maxillary atrophy.  POSTOPERATIVE DIAGNOSIS:  Maxillary atrophy.  PROCEDURE:  Bone graft, left maxilla.  SURGEON:  Cornelia Dieter, DDS  ANESTHESIA:  General, oral intubation.  Hodierne attending.  DESCRIPTION OF PROCEDURE:  The patient was taken to the operating room and placed on the table in the supine position.  General anesthesia was administered.  An oral endotracheal tube was placed and secured. The eyes were protected.  The patient was  draped for surgery.  Timeout was performed.  The posterior pharynx was suctioned and a throat pack was placed. 2% lidocaine with 1:100,000 epinephrine was infiltrated buccally and palatally in the left maxilla from the midline to the premolar area.  A 15  blade was used to make an incision on the alveolar crest beginning at the premolar area carried forward to the midline, and vertical releasing incisions were made at each end of the crestal incision.  The periosteum was reflected with a periosteal elevator.   The bony defect was identified.  The bony crypt was debrided with curettes and rongeur, and the tissue was reflected buccally and palatally and periosteal releasing incisions were made with a 15 blade, and the tissue was expanded with a periosteal  elevator.  Then, blood was drawn from the patient and put in a centrifuge to create platelet-rich fibrin graft.  Then, the graft bone putty was placed into the socket, and a titanium membrane was adapted over it.  The membrane was secured to the buccal  bone with two 4-mm self-tapping screws.  Then, the platelet membrane was used to form a covering over the titanium.  Then, the area was closed with 3-0 Prolene and 3-0 chromic gut.  The oral cavity was then  irrigated and suctioned.  The throat pack was  removed.  Additional local anesthesia was administered.  The patient was left in care of anesthesia for extubation and transport to recovery with plans for a 23-hour stay.  ESTIMATED BLOOD LOSS:  Minimum.  COMPLICATIONS:  None.  SPECIMENS:  None.   SHW D: 07/15/2023 9:10:02 am T: 07/15/2023 10:30:00 am  JOB: [id number]/ [id number]

## 2023-07-15 NOTE — Anesthesia Procedure Notes (Signed)
 Procedure Name: Intubation Date/Time: 07/15/2023 7:36 AM  Performed by: Antony Salmon, RNPre-anesthesia Checklist: Patient identified, Emergency Drugs available, Suction available and Patient being monitored Patient Re-evaluated:Patient Re-evaluated prior to induction Oxygen Delivery Method: Circle system utilized Preoxygenation: Pre-oxygenation with 100% oxygen Induction Type: IV induction Ventilation: Mask ventilation without difficulty and Oral airway inserted - appropriate to patient size Laryngoscope Size: Hyacinth Meeker and 2 Grade View: Grade I Tube type: Oral Rae Tube size: 6.5 mm Number of attempts: 1 Airway Equipment and Method: Oral airway Placement Confirmation: ETT inserted through vocal cords under direct vision, positive ETCO2 and breath sounds checked- equal and bilateral Secured at: 20 cm Tube secured with: Tape Dental Injury: Teeth and Oropharynx as per pre-operative assessment

## 2023-07-15 NOTE — Transfer of Care (Signed)
 Immediate Anesthesia Transfer of Care Note  Patient: Ashlee Day  Procedure(s) Performed: BONE GRAFT LEFT MAXILLA (Left: Mouth)  Patient Location: PACU  Anesthesia Type:General  Level of Consciousness: awake, oriented, and patient cooperative  Airway & Oxygen Therapy: Patient Spontanous Breathing and Patient connected to face mask oxygen  Post-op Assessment: Report given to RN, Post -op Vital signs reviewed and stable, and Patient moving all extremities X 4  Post vital signs: Reviewed and stable  Last Vitals:  Vitals Value Taken Time  BP 130/97 07/15/23 0915  Temp    Pulse 106 07/15/23 0919  Resp 11 07/15/23 0919  SpO2 93 % 07/15/23 0919  Vitals shown include unfiled device data.  Last Pain:  Vitals:   07/15/23 0650  TempSrc:   PainSc: 7       Patients Stated Pain Goal: 5 (07/15/23 0650)  Complications: No notable events documented.

## 2023-07-15 NOTE — Anesthesia Postprocedure Evaluation (Signed)
 Anesthesia Post Note  Patient: Ashlee Day  Procedure(s) Performed: BONE GRAFT LEFT MAXILLA (Left: Mouth)     Patient location during evaluation: PACU Anesthesia Type: General Level of consciousness: awake and alert Pain management: pain level controlled Vital Signs Assessment: post-procedure vital signs reviewed and stable Respiratory status: spontaneous breathing, nonlabored ventilation, respiratory function stable and patient connected to nasal cannula oxygen Cardiovascular status: blood pressure returned to baseline and stable Postop Assessment: no apparent nausea or vomiting Anesthetic complications: no   No notable events documented.  Last Vitals:  Vitals:   07/15/23 1215 07/15/23 1242  BP: 121/82 123/88  Pulse: 85 90  Resp: 15 20  Temp: 36.9 C 36.7 C  SpO2: 93% 100%    Last Pain:  Vitals:   07/15/23 1359  TempSrc:   PainSc: 9                  Hamed Debella S

## 2023-07-15 NOTE — Op Note (Addendum)
 07/15/2023  9:06 AM  PATIENT:  Ashlee Day  38 y.o. female  PRE-OPERATIVE DIAGNOSIS: maxillary atrophy  POST-OPERATIVE DIAGNOSIS:  SAME  PROCEDURE:  Procedure(s): BONE GRAFT LEFT MAXILLA  SURGEON:  Surgeon(s): Ocie Doyne, DMD  ANESTHESIA:   local and general  EBL:  minimal  DRAINS: none   SPECIMEN:  No Specimen  COUNTS:  YES  PLAN OF CARE: 23h obs  after PACU  PATIENT DISPOSITION:  PACU - hemodynamically stable.   PROCEDURE DETAILS: Dictation #[id number]  Georgia Lopes, DMD 07/15/2023 9:06 AM

## 2023-07-15 NOTE — H&P (Signed)
 H&P documentation  -History and Physical Reviewed  -Patient has been re-examined  -No change in the plan of care  Ashlee Day

## 2023-07-15 NOTE — Progress Notes (Signed)
 PHARMACIST - PHYSICIAN ORDER COMMUNICATION  Ashlee Day is a 38 y.o. year old female with a history of bipolar d/o on Clozapine PTA. Continuing this medication order as an inpatient requires that monitoring parameters per REMS requirements must be met.  Clozapine REMS Dispense Authorization was obtained, and will dispense inpatient.  RDA code [id number].  Verified Clozapine dose: 175mg  in the morning, 200mg  in the afternoon, 150 mg in the evening Last ANC value and date reported on the Clozapine REMS website: 06/08/2023 ANC monitoring frequency: q3 months  Christoper Fabian, PharmD, BCPS Please see amion for complete clinical pharmacist phone list 07/15/2023, 5:36 PM

## 2023-07-16 DIAGNOSIS — M2769 Other endosseous dental implant failure: Secondary | ICD-10-CM | POA: Diagnosis not present

## 2023-07-16 MED ORDER — AZITHROMYCIN 250 MG PO TABS: ORAL_TABLET | ORAL | 0 refills | Status: DC

## 2023-07-16 MED ORDER — CHLORHEXIDINE GLUCONATE 0.12 % MT SOLN: 15.0000 mL | Freq: Two times a day (BID) | OROMUCOSAL | 0 refills | Status: AC

## 2023-07-16 MED ORDER — OXYCODONE HCL 5 MG PO TABS: 5.0000 mg | ORAL_TABLET | ORAL | 0 refills | Status: AC | PRN

## 2023-07-16 NOTE — Plan of Care (Signed)

## 2023-07-16 NOTE — Care Management Obs Status (Signed)
 MEDICARE OBSERVATION STATUS NOTIFICATION   Patient Details  Name: Ashlee Day MRN: [id number] Date of Birth: [birth year]   Medicare Observation Status Notification Given:  Yes    Jannine Meo, RN 07/16/2023, 9:22 AM

## 2023-07-16 NOTE — OR Nursing (Signed)
 Patient ID: Ashlee Day MRN: [id number] DOB/AGE: 38-Jan-1987 38 y.o.  Admit date: 07/15/2023 Discharge date: 07/16/2023  Admission Diagnoses: Post operative state, maxillary atrophy  Discharge Diagnoses:  Principal Problem:   Post-operative state   Discharged Condition: good  Hospital Course: Admitted post operatively for pain management and observation  Consults: None  Significant Diagnostic Studies: None other than pre=op  Treatments: surgery: Bone graft to maxilla  Discharge Exam: Blood pressure 109/77, pulse 92, temperature 98.4 F (36.9 C), temperature source Oral, resp. rate 17, height 5' 2.5" (1.588 m), weight 102.1 kg, last menstrual period 07/07/2023, SpO2 96%. General appearance: alert, appears stated age, and morbidly obese Head: Normocephalic, without obvious abnormality, atraumatic Eyes: negative Nose: Nares normal. Septum midline. Mucosa normal. No drainage or sinus tenderness. Throat: Sutures intact, hemostatic, mild edema , pharynx clear. Neck: no adenopathy  Disposition: Home   Allergies as of 07/16/2023       Reactions   Lovenox [enoxaparin] Nausea Only   Paroxetine Hcl    Hallucinations and agitation   Amoxicillin Other (See Comments)   Blisters all over Cannot take any 'cillins' at all   Demerol [meperidine] Nausea And Vomiting, Other (See Comments)   Can not tolerate Iv administration Can tolerate IM adminstration   Haldol [haloperidol] Other (See Comments)   Reverse effect   Imitrex [sumatriptan] Other (See Comments)   Severity unknown   Macrobid [nitrofurantoin] Nausea And Vomiting   Other Itching, Other (See Comments)   Pink NG tape   Penicillins Other (See Comments)   Cannot not tolerate any cillins due to amoxicillin reaction   Buprenorphine Rash   Depacon [valproic Acid] Nausea And Vomiting, Rash   Depakote [divalproex Sodium] Nausea And Vomiting, Rash   Dihydroergotamine Nausea And Vomiting, Rash   Morphine And Codeine Rash,  Other (See Comments)   IV form   Prednisone    Patient become nauseated and jittery   Reglan [metoclopramide] Nausea And Vomiting, Rash, Other (See Comments)   React to IV form Can tolerate oral medication        Medication List     TAKE these medications    albuterol 108 (90 Base) MCG/ACT inhaler Commonly known as: VENTOLIN HFA Inhale 2 puffs into the lungs every 6 (six) hours as needed for wheezing or shortness of breath.   azithromycin 250 MG tablet Commonly known as: Zithromax Z-Pak Take 2 tablets on day one, then 1 tablet per day until completed   buprenorphine 15 MCG/HR Commonly known as: BUTRANS Place 1 patch onto the skin once a week.   calcium carbonate 600 MG Tabs tablet Commonly known as: OS-CAL Take 2 tablets by mouth daily.   cetirizine 10 MG tablet Commonly known as: ZYRTEC Take 10 mg by mouth daily.   chlorhexidine 0.12 % solution Commonly known as: PERIDEX Use as directed 15 mLs in the mouth or throat 2 (two) times daily. What changed: Another medication with the same name was added. Make sure you understand how and when to take each.   chlorhexidine 0.12 % solution Commonly known as: Peridex Use as directed 15 mLs in the mouth or throat 2 (two) times daily. What changed: You were already taking a medication with the same name, and this prescription was added. Make sure you understand how and when to take each.   cholecalciferol 1000 units tablet Commonly known as: VITAMIN D Take 1 tablet (1,000 Units total) by mouth daily. For nutritional supplement.   clonazePAM 0.5 MG tablet Commonly known as: KLONOPIN Take  0.25 mg by mouth at bedtime as needed (sleep).   cloZAPine 100 MG tablet Commonly known as: CLOZARIL Take 150-200 mg by mouth See admin instructions. Take 150 mg in the morning, take with 25 mg  for a total of 175 mg in the morning, 200 mg  in the afternoon & 150 mg bedtime   cloZAPine 25 MG tablet Commonly known as: CLOZARIL Take 25  mg by mouth in the morning.   cyanocobalamin 1000 MCG tablet Commonly known as: VITAMIN B12 Take 1,000 mcg by mouth daily.   cyclobenzaprine 10 MG tablet Commonly known as: FLEXERIL Take 10 mg by mouth 3 (three) times daily as needed for muscle spasms.   diclofenac Sodium 1 % Gel Commonly known as: VOLTAREN Apply 1 application  topically 2 (two) times daily as needed (joint pain).   dicyclomine 10 MG capsule Commonly known as: BENTYL Take 10 mg by mouth daily as needed for spasms.   doxazosin 2 MG tablet Commonly known as: CARDURA Take 2 mg by mouth daily.   ferrous sulfate 325 (65 FE) MG EC tablet Take 325 mg by mouth every other day.   fluticasone 0.05 % cream Commonly known as: CUTIVATE Apply 1 Application topically daily as needed (irritation).   fluticasone 50 MCG/ACT nasal spray Commonly known as: FLONASE Place 1 spray into both nostrils daily as needed for allergies or rhinitis.   HYDROcodone-acetaminophen 7.5-325 MG tablet Commonly known as: NORCO Take 1 tablet by mouth 2 (two) times daily as needed for severe pain (pain score 7-10) or moderate pain (pain score 4-6).   ibuprofen 600 MG tablet Commonly known as: ADVIL Take 1 tablet (600 mg total) by mouth every 6 (six) hours as needed. What changed: reasons to take this   lamoTRIgine 100 MG tablet Commonly known as: LAMICTAL Take 200 mg by mouth at bedtime.   levothyroxine 112 MCG tablet Commonly known as: SYNTHROID Take 112 mcg by mouth daily before breakfast.   lidocaine 5 % ointment Commonly known as: XYLOCAINE Apply 1 application  topically daily as needed for mild pain (pain score 1-3) or moderate pain (pain score 4-6).   metFORMIN 500 MG 24 hr tablet Commonly known as: GLUCOPHAGE-XR Take 1,000 mg by mouth 2 (two) times daily.   metoprolol succinate 100 MG 24 hr tablet Commonly known as: TOPROL-XL Take 100 mg by mouth at bedtime.   Norethindrone-Ethinyl Estradiol-Fe Biphas 1 MG-10 MCG / 10 MCG  tablet Commonly known as: LO LOESTRIN FE Take 1 tablet by mouth daily.   nystatin cream Commonly known as: MYCOSTATIN Apply 1 Application topically 2 (two) times daily as needed for dry skin.   nystatin powder Generic drug: nystatin Apply 1 Application topically 2 (two) times daily as needed (yeast).   OLANZapine 5 MG tablet Commonly known as: ZYPREXA Take 5 mg by mouth 2 (two) times daily as needed (Anxiety with psychosis).   ondansetron 4 MG tablet Commonly known as: ZOFRAN Take 4 mg by mouth every 8 (eight) hours as needed for nausea/vomiting.   oxyCODONE 5 MG immediate release tablet Commonly known as: Roxicodone Take 1 tablet (5 mg total) by mouth every 4 (four) hours as needed for up to 3 days. Take 1-2 tabs q 4 hours prn pain   polyethylene glycol 17 g packet Commonly known as: MIRALAX / GLYCOLAX Take 17 g by mouth daily as needed for mild constipation or moderate constipation.   promethazine 25 MG tablet Commonly known as: PHENERGAN Take 1 tablet (25 mg total) by  mouth every 6 (six) hours as needed for nausea or vomiting.   triamcinolone cream 0.1 % Commonly known as: KENALOG Apply 1 Application topically 2 (two) times daily as needed (eczema).   venlafaxine XR 150 MG 24 hr capsule Commonly known as: EFFEXOR-XR Take 150 mg by mouth daily with breakfast.         Signed: Ascencion Lava 07/16/2023, 10:44 AM

## 2023-07-17 NOTE — Discharge Summary (Signed)
 Patient ID: Ashlee Day MRN: [id number] DOB/AGE: April 15, 1985 37 y.o.  Admit date: 07/15/2023 Discharge date: 2  Admission Diagnoses:  Discharge Diagnoses:  Principal Problem:   Post-operative state   Discharged Condition: good  Hospital Course: Admitted for observation post surgery  Consults: None  Significant Diagnostic Studies: Pre op labs  Treatments: surgery: Bone graft to maxilla  Discharge Exam: Blood pressure 104/63, pulse 97, temperature 99 F (37.2 C), temperature source Oral, resp. rate 17, height 5' 2.5" (1.588 m), weight 102.1 kg, last menstrual period 07/07/2023, SpO2 94%. General appearance: alert, cooperative, no distress, and morbidly obese Head: Normocephalic, without obvious abnormality, atraumatic Eyes: negative Throat: Sutures intact, hemostatic, minimal edema, pharynx clear  Disposition: Discharge disposition: 01-Home or Self Care        Allergies as of 07/16/2023       Reactions   Lovenox [enoxaparin] Nausea Only   Paroxetine Hcl    Hallucinations and agitation   Amoxicillin Other (See Comments)   Blisters all over Cannot take any 'cillins' at all   Demerol [meperidine] Nausea And Vomiting, Other (See Comments)   Can not tolerate Iv administration Can tolerate IM adminstration   Haldol [haloperidol] Other (See Comments)   Reverse effect   Imitrex [sumatriptan] Other (See Comments)   Severity unknown   Macrobid [nitrofurantoin] Nausea And Vomiting   Other Itching, Other (See Comments)   Pink NG tape   Penicillins Other (See Comments)   Cannot not tolerate any cillins due to amoxicillin reaction   Buprenorphine Rash   Depacon [valproic Acid] Nausea And Vomiting, Rash   Depakote [divalproex Sodium] Nausea And Vomiting, Rash   Dihydroergotamine Nausea And Vomiting, Rash   Morphine And Codeine Rash, Other (See Comments)   IV form   Prednisone    Patient become nauseated and jittery   Reglan [metoclopramide] Nausea And Vomiting,  Rash, Other (See Comments)   React to IV form Can tolerate oral medication        Medication List     TAKE these medications    albuterol 108 (90 Base) MCG/ACT inhaler Commonly known as: VENTOLIN HFA Inhale 2 puffs into the lungs every 6 (six) hours as needed for wheezing or shortness of breath.   azithromycin 250 MG tablet Commonly known as: Zithromax Z-Pak Take 2 tablets on day one, then 1 tablet per day until completed   buprenorphine 15 MCG/HR Commonly known as: BUTRANS Place 1 patch onto the skin once a week.   calcium carbonate 600 MG Tabs tablet Commonly known as: OS-CAL Take 2 tablets by mouth daily.   cetirizine 10 MG tablet Commonly known as: ZYRTEC Take 10 mg by mouth daily.   chlorhexidine 0.12 % solution Commonly known as: PERIDEX Use as directed 15 mLs in the mouth or throat 2 (two) times daily. What changed: Another medication with the same name was added. Make sure you understand how and when to take each.   chlorhexidine 0.12 % solution Commonly known as: Peridex Use as directed 15 mLs in the mouth or throat 2 (two) times daily. What changed: You were already taking a medication with the same name, and this prescription was added. Make sure you understand how and when to take each.   cholecalciferol 1000 units tablet Commonly known as: VITAMIN D Take 1 tablet (1,000 Units total) by mouth daily. For nutritional supplement.   clonazePAM 0.5 MG tablet Commonly known as: KLONOPIN Take 0.25 mg by mouth at bedtime as needed (sleep).   cloZAPine 100 MG tablet  Commonly known as: CLOZARIL Take 150-200 mg by mouth See admin instructions. Take 150 mg in the morning, take with 25 mg  for a total of 175 mg in the morning, 200 mg  in the afternoon & 150 mg bedtime   cloZAPine 25 MG tablet Commonly known as: CLOZARIL Take 25 mg by mouth in the morning.   cyanocobalamin 1000 MCG tablet Commonly known as: VITAMIN B12 Take 1,000 mcg by mouth daily.    cyclobenzaprine 10 MG tablet Commonly known as: FLEXERIL Take 10 mg by mouth 3 (three) times daily as needed for muscle spasms.   diclofenac Sodium 1 % Gel Commonly known as: VOLTAREN Apply 1 application  topically 2 (two) times daily as needed (joint pain).   dicyclomine 10 MG capsule Commonly known as: BENTYL Take 10 mg by mouth daily as needed for spasms.   doxazosin 2 MG tablet Commonly known as: CARDURA Take 2 mg by mouth daily.   ferrous sulfate 325 (65 FE) MG EC tablet Take 325 mg by mouth every other day.   fluticasone 0.05 % cream Commonly known as: CUTIVATE Apply 1 Application topically daily as needed (irritation).   fluticasone 50 MCG/ACT nasal spray Commonly known as: FLONASE Place 1 spray into both nostrils daily as needed for allergies or rhinitis.   HYDROcodone-acetaminophen 7.5-325 MG tablet Commonly known as: NORCO Take 1 tablet by mouth 2 (two) times daily as needed for severe pain (pain score 7-10) or moderate pain (pain score 4-6).   ibuprofen 600 MG tablet Commonly known as: ADVIL Take 1 tablet (600 mg total) by mouth every 6 (six) hours as needed. What changed: reasons to take this   lamoTRIgine 100 MG tablet Commonly known as: LAMICTAL Take 200 mg by mouth at bedtime.   levothyroxine 112 MCG tablet Commonly known as: SYNTHROID Take 112 mcg by mouth daily before breakfast.   lidocaine 5 % ointment Commonly known as: XYLOCAINE Apply 1 application  topically daily as needed for mild pain (pain score 1-3) or moderate pain (pain score 4-6).   metFORMIN 500 MG 24 hr tablet Commonly known as: GLUCOPHAGE-XR Take 1,000 mg by mouth 2 (two) times daily.   metoprolol succinate 100 MG 24 hr tablet Commonly known as: TOPROL-XL Take 100 mg by mouth at bedtime.   Norethindrone-Ethinyl Estradiol-Fe Biphas 1 MG-10 MCG / 10 MCG tablet Commonly known as: LO LOESTRIN FE Take 1 tablet by mouth daily.   nystatin cream Commonly known as:  MYCOSTATIN Apply 1 Application topically 2 (two) times daily as needed for dry skin.   nystatin powder Generic drug: nystatin Apply 1 Application topically 2 (two) times daily as needed (yeast).   OLANZapine 5 MG tablet Commonly known as: ZYPREXA Take 5 mg by mouth 2 (two) times daily as needed (Anxiety with psychosis).   ondansetron 4 MG tablet Commonly known as: ZOFRAN Take 4 mg by mouth every 8 (eight) hours as needed for nausea/vomiting.   oxyCODONE 5 MG immediate release tablet Commonly known as: Roxicodone Take 1 tablet (5 mg total) by mouth every 4 (four) hours as needed for up to 3 days. Take 1-2 tabs q 4 hours prn pain   polyethylene glycol 17 g packet Commonly known as: MIRALAX / GLYCOLAX Take 17 g by mouth daily as needed for mild constipation or moderate constipation.   promethazine 25 MG tablet Commonly known as: PHENERGAN Take 1 tablet (25 mg total) by mouth every 6 (six) hours as needed for nausea or vomiting.   triamcinolone cream  0.1 % Commonly known as: KENALOG Apply 1 Application topically 2 (two) times daily as needed (eczema).   venlafaxine XR 150 MG 24 hr capsule Commonly known as: EFFEXOR-XR Take 150 mg by mouth daily with breakfast.         Signed: Ascencion Lava 07/17/2023, 9:21 AM

## 2023-07-18 ENCOUNTER — Encounter (HOSPITAL_COMMUNITY): Payer: Self-pay | Admitting: Oral Surgery

## 2023-07-21 ENCOUNTER — Encounter (HOSPITAL_COMMUNITY): Payer: Self-pay | Admitting: Oral Surgery

## 2023-09-15 ENCOUNTER — Encounter (HOSPITAL_COMMUNITY): Payer: Self-pay | Admitting: Oral Surgery

## 2023-09-15 ENCOUNTER — Other Ambulatory Visit: Payer: Self-pay

## 2023-09-15 NOTE — Progress Notes (Signed)
 SDW call  Patient's mother Argentina Kugel was given pre-op instructions over the phone as patient was unavailable. She verbalized understanding of instructions provided.She denied patient has SOB, fever, cough or CP.     PCP - Marveen Slick, PA-C Cardiologist - Dr. Melton Squires Pulmonary:    PPM/ICD - denies Device Orders - na Rep Notified - na   Chest x-ray - 03/23/2022 CE EKG -  07/15/2023 Stress Test - ECHO - 10/09/2021 Cardiac Cath -   Sleep Study/sleep apnea/CPAP: denies  Non-diabetic  Metformin , for weight loss, Hold DOS  Blood Thinner Instructions: denies Aspirin Instructions:denies   ERAS Protcol - NPO   Anesthesia review: Yes. HTN, Hashimoto's, torticollis, dysrhythmia, reviewed 4/11  Your procedure is scheduled on Friday September 16, 2023  Report to [facility] Main Entrance A at [phone]   A.M., then check in with the Admitting office.  Call this number if you have problems the morning of surgery:  [phone]   If you have any questions prior to your surgery date call [phone]: Open Monday-Friday 8am-4pm If you experience any cold or flu symptoms such as cough, fever, chills, shortness of breath, etc. between now and your scheduled surgery, please notify us  at the above number    Remember:  Do not eat or drink after midnight the night before your surgery  Take these medicines the morning of surgery with A SIP OF WATER :  Zyrtec, clozapine , cardura , synthroid , Lo-Loestrin , effexor ,   As needed: Albuterol , flexeril , bentyl , flonase , norco, zyprexa , zofran , phenergan   As of today, STOP taking any Aspirin (unless otherwise instructed by your surgeon) Aleve , Naproxen , Ibuprofen , Motrin , Advil , Goody's, BC's, all herbal medications, fish oil, and all vitamins.

## 2023-09-15 NOTE — Progress Notes (Signed)
 Anesthesia Chart Review: Ashlee Day  Case: [id number] Date/Time: 09/16/23 0838   Procedure: DENTAL RESTORATION/EXTRACTIONS   Anesthesia type: General   Diagnosis: Non-restorable tooth [K08.89]   Pre-op diagnosis: NONRESTORABLE   [facility] RO[facility] / [facility]   Surgeons: Ascencion Lava, DMD       DISCUSSION:  Patient is a 38 year old female scheduled for the above procedure. S/p bone graft to left maxilla on 07/15/23 in anticipation of reimplantation of #10 dental implant. Per H&P note, Bone graft of left maxilla now infected with exposed hardware. Removal of left maxilla hardware planned.   History includes never smoker, post-operative N/V, Bipolar disorder, MEN1 multiple endocrine neoplasia, Hashimoto's thyroiditis (2005),  papillary thyroid  cancer (s/p thyroidectomy 2014, post surgical hypothyroidism), hyperparathyroidism (s/p 1 gland parathyroidectomy), pituitary adenoma, PCOS, HTN, sinus tachycardia, appendectomy, mandibular fracture (s/p ORIF), multiple dental implants (07/23/21, implant #10 implant failure with bone loss, s/p removal 11/29/22, s/p bone graft left maxilla 07/15/23 for future reimplantation), obesity.   Last cardiology visit with Dr. Leeland Pugh was on 12/08/22 for follow-up HTN and palpitations. She was doing well from a cardiac standpoint. Occasional edema, so torsemide 10 mg daily as needed added. 9 month follow-up planned.    Reported being on metformin  to control weight.    She has a Butrans  patch.   Anesthesia team to evaluate on the day of surgery.    VS: Ht 5' 2 (1.575 m)   Wt 102.1 kg   LMP 09/07/2023 (Approximate)   BMI 41.15 kg/m  BP Readings from Last 3 Encounters:  07/16/23 104/63  06/12/22 115/74  07/24/21 123/83   Pulse Readings from Last 3 Encounters:  07/16/23 97  06/12/22 (!) 102  07/24/21 93     PROVIDERS: [facility], [employer], clinical [facility] At PCP - Marveen Slick, PA-C Cardiologist - Dr. Melton Squires Endocrinologist - Dr.  Jillene Motley   LABS: For day of surgery. As of 07/15/23, WBC 6.4, H/H 11.1/35.0, PLT 221, glucose 110, Cr 1.04.    EKG: EKG 07/15/23: Normal sinus rhythm Possible Right ventricular hypertrophy Abnormal ECG When compared with ECG of 23-Jul-2021 06:18, PREVIOUS ECG IS PRESENT No significant change since last tracing Confirmed by Magnus Schuller ([id number]) on 07/15/2023 6:36:19 PM     CV: Echo 10/09/21 ([facility]): Left Ventricle: The calculated left ventricular ejection fraction is  60%.   Left Ventricle: Wall thickness is normal.    Left Ventricle: Systolic function is normal. EF: 55-60%.    Left Ventricle: Wall motion is normal.    Left Ventricle: Doppler parameters indicate normal diastolic function.    Pericardium: There is no pericardial effusion.    Past Medical History:  Diagnosis Date   Ankle fracture    right   Bipolar disorder (HCC)    Cancer (HCC)    THYROID  CANCER   Chronic pain disorder    Depression    Dysrhythmia    SURGES TACHYCARDIA     Family history of adverse reaction to anesthesia    mother also has nausea    Fibromyalgia    GERD (gastroesophageal reflux disease)    Hashimoto's thyroiditis    2005ish   History of kidney stones    2013+2017   Hyperparathyroidism (HCC)    Hyperparathyroidism, unspecified (HCC)    Hypertension    Hypothyroidism    Ileus (HCC)    x3-4   MEN 1 (multiple endocrine neoplasia) (HCC)    Migraines    occasional aura   Partial edentulism  Pituitary adenoma (HCC)    Polycystic disease, ovaries    PONV (postoperative nausea and vomiting)    Psychosis (HCC)    ORGANIC PSYCHOSIS     Torticollis     Past Surgical History:  Procedure Laterality Date   appendicitis     2003   DENTAL SURGERY     MULT   DENTAL SURGERY  11/19/2016   DILATION AND CURETTAGE OF UTERUS         1999    TOTAL 3   HARVEST BONE GRAFT Left 07/15/2023   Procedure: BONE GRAFT LEFT MAXILLA;  Surgeon: Ascencion Lava, DMD;  Location[facility] OR;   Service: Oral Surgery;  Laterality: Left;   LAPAROSCOPY     MANDIBULAR HARDWARE REMOVAL N/A 07/23/2021   Procedure: REMOVING OF FAILED IMPLANT, PLACEMENT OF DENTAL IMPLANT;  Surgeon: Ascencion Lava, DMD;  Location: [facility] OR;  Service: Oral Surgery;  Laterality: N/A;   ORIF ANKLE FRACTURE Right 12/15/2016   ORIF ANKLE FRACTURE Right 12/15/2016   Procedure: OPEN REDUCTION INTERNAL FIXATION (ORIF) ANKLE FRACTURE;  Surgeon: Micheline Ahr, MD;  Location: [facility] OR;  Service: Orthopedics;  Laterality: Right;   ORIF MANDIBULAR FRACTURE N/A 11/19/2016   Procedure: Extraction (teeth numbers seven, eight, nine); Alveoloplasty; Placement maxillary and mandibular implants;  Surgeon: Ascencion Lava, DDS;  Location: [facility] OR;  Service: Oral Surgery;  Laterality: N/A;   PARATHYROIDECTOMY     SYNDESMOSIS REPAIR Right 12/15/2016   Procedure: SYNDESMOSIS REPAIR;  Surgeon: Micheline Ahr, MD;  Location: [facility] OR;  Service: Orthopedics;  Laterality: Right;   THYROIDECTOMY      MEDICATIONS: No current facility-administered medications for this encounter.    albuterol  (PROVENTIL  HFA;VENTOLIN  HFA) 108 (90 Base) MCG/ACT inhaler   azithromycin  (ZITHROMAX  Z-PAK) 250 MG tablet   buprenorphine  (BUTRANS ) 15 MCG/HR   calcium  carbonate (OS-CAL) 600 MG TABS tablet   cetirizine (ZYRTEC) 10 MG tablet   chlorhexidine  (PERIDEX ) 0.12 % solution   chlorhexidine  (PERIDEX ) 0.12 % solution   cholecalciferol  (VITAMIN D ) 1000 UNITS tablet   clonazePAM  (KLONOPIN ) 0.5 MG tablet   cloZAPine  (CLOZARIL ) 100 MG tablet   cloZAPine  (CLOZARIL ) 25 MG tablet   cyclobenzaprine  (FLEXERIL ) 10 MG tablet   diclofenac Sodium (VOLTAREN) 1 % GEL   dicyclomine  (BENTYL ) 10 MG capsule   doxazosin  (CARDURA ) 2 MG tablet   ferrous sulfate  325 (65 FE) MG EC tablet   fluticasone  (CUTIVATE ) 0.05 % cream   fluticasone  (FLONASE ) 50 MCG/ACT nasal spray   HYDROcodone -acetaminophen  (NORCO) 7.5-325 MG tablet   ibuprofen  (ADVIL ) 600 MG tablet   lamoTRIgine  (LAMICTAL ) 100  MG tablet   levothyroxine  (SYNTHROID , LEVOTHROID) 112 MCG tablet   lidocaine  (XYLOCAINE ) 5 % ointment   metFORMIN  (GLUCOPHAGE -XR) 500 MG 24 hr tablet   metoprolol  succinate (TOPROL -XL) 100 MG 24 hr tablet   Norethindrone -Ethinyl Estradiol -Fe Biphas (LO LOESTRIN FE ) 1 MG-10 MCG / 10 MCG tablet   nystatin cream (MYCOSTATIN)   NYSTATIN powder   OLANZapine  (ZYPREXA ) 5 MG tablet   ondansetron  (ZOFRAN ) 4 MG tablet   polyethylene glycol (MIRALAX  / GLYCOLAX ) packet   promethazine  (PHENERGAN ) 25 MG tablet   triamcinolone  cream (KENALOG ) 0.1 %   venlafaxine  XR (EFFEXOR -XR) 150 MG 24 hr capsule   vitamin B-12 (CYANOCOBALAMIN ) 1000 MCG tablet   Not currently on azithromycin .   Ella Gun, PA-C Surgical Short Stay/Anesthesiology [facility] Phone [phone] [facility] Phone [phone] 09/15/2023 4:18 PM

## 2023-09-15 NOTE — Anesthesia Preprocedure Evaluation (Addendum)
 Anesthesia Evaluation  Patient identified by MRN, date of birth, ID band Patient awake    Reviewed: Allergy & Precautions, H&P , NPO status , Patient's Chart, lab work & pertinent test results  History of Anesthesia Complications (+) PONV and history of anesthetic complications  Airway Mallampati: II  TM Distance: >3 FB Neck ROM: full  Mouth opening: Limited Mouth Opening  Dental no notable dental hx. (+) Poor Dentition   Pulmonary neg pulmonary ROS   Pulmonary exam normal breath sounds clear to auscultation       Cardiovascular hypertension, Normal cardiovascular exam+ dysrhythmias  Rhythm:Regular Rate:Normal     Neuro/Psych  Headaches PSYCHIATRIC DISORDERS  Depression Bipolar Disorder    Neuromuscular disease    GI/Hepatic ,GERD  ,,  Endo/Other  Hypothyroidism  Class 3 obesity  Renal/GU      Musculoskeletal  (+)  Fibromyalgia -  Abdominal   Peds  Hematology   Anesthesia Other Findings All: see list  Reproductive/Obstetrics                             Anesthesia Physical Anesthesia Plan  ASA: 3  Anesthesia Plan: General   Post-op Pain Management: Precedex  and Dilaudid  IV   Induction: Intravenous  PONV Risk Score and Plan: 4 or greater and Treatment may vary due to age or medical condition, Midazolam  and Ondansetron   Airway Management Planned: Oral ETT and Video Laryngoscope Planned  Additional Equipment: None  Intra-op Plan:   Post-operative Plan: Extubation in OR  Informed Consent: I have reviewed the patients History and Physical, chart, labs and discussed the procedure including the risks, benefits and alternatives for the proposed anesthesia with the patient or authorized representative who has indicated his/her understanding and acceptance.     Dental advisory given  Plan Discussed with: CRNA, Anesthesiologist and Surgeon  Anesthesia Plan Comments: (PAT note  written 09/15/2023 by Allison Zelenak, PA-C.  )       Anesthesia Quick Evaluation

## 2023-09-15 NOTE — H&P (Signed)
 HISTORY AND PHYSICAL   Ashlee Day is a 38 y.o. female patient s/p placement multiple dental implants 07/23/2021. Implant failure at tooth # 10 site with bone loss. Implant removed 11/29/2022. Bone graft of left maxilla now infected with exposed hardware.  No diagnosis found.  Past Medical History:  Diagnosis Date   Ankle fracture    right   Bipolar disorder (HCC)    Cancer (HCC)    THYROID  CANCER   Chronic pain disorder    Depression    Dysrhythmia    SURGES TACHYCARDIA     Family history of adverse reaction to anesthesia    mother also has nausea    Fibromyalgia    GERD (gastroesophageal reflux disease)    Hashimoto's thyroiditis    2005ish   History of kidney stones    2013+2017   Hyperparathyroidism (HCC)    Hyperparathyroidism, unspecified (HCC)    Hypertension    Hypothyroidism    Ileus (HCC)    x3-4   MEN 1 (multiple endocrine neoplasia) (HCC)    Migraines    occasional aura   Partial edentulism    Pituitary adenoma (HCC)    Polycystic disease, ovaries    PONV (postoperative nausea and vomiting)    Psychosis (HCC)    ORGANIC PSYCHOSIS     Torticollis     No current facility-administered medications for this encounter.   Current Outpatient Medications  Medication Sig Dispense Refill   albuterol  (PROVENTIL  HFA;VENTOLIN  HFA) 108 (90 Base) MCG/ACT inhaler Inhale 2 puffs into the lungs every 6 (six) hours as needed for wheezing or shortness of breath.     azithromycin  (ZITHROMAX  Z-PAK) 250 MG tablet Take 2 tablets on day one, then 1 tablet per day until completed 6 each 0   buprenorphine  (BUTRANS ) 15 MCG/HR Place 1 patch onto the skin once a week.     calcium  carbonate (OS-CAL) 600 MG TABS tablet Take 2 tablets by mouth daily.     cetirizine (ZYRTEC) 10 MG tablet Take 10 mg by mouth daily.     chlorhexidine  (PERIDEX ) 0.12 % solution Use as directed 15 mLs in the mouth or throat 2 (two) times daily.     chlorhexidine  (PERIDEX ) 0.12 % solution Use as directed 15  mLs in the mouth or throat 2 (two) times daily. 120 mL 0   cholecalciferol  (VITAMIN D ) 1000 UNITS tablet Take 1 tablet (1,000 Units total) by mouth daily. For nutritional supplement.     clonazePAM  (KLONOPIN ) 0.5 MG tablet Take 0.25 mg by mouth at bedtime as needed (sleep).     cloZAPine  (CLOZARIL ) 100 MG tablet Take 150-200 mg by mouth See admin instructions. Take 150 mg in the morning, take with 25 mg  for a total of 175 mg in the morning, 200 mg  in the afternoon & 150 mg bedtime     cloZAPine  (CLOZARIL ) 25 MG tablet Take 25 mg by mouth in the morning.     cyclobenzaprine  (FLEXERIL ) 10 MG tablet Take 10 mg by mouth 3 (three) times daily as needed for muscle spasms.     diclofenac Sodium (VOLTAREN) 1 % GEL Apply 1 application  topically 2 (two) times daily as needed (joint pain).     dicyclomine  (BENTYL ) 10 MG capsule Take 10 mg by mouth daily as needed for spasms.     doxazosin  (CARDURA ) 2 MG tablet Take 2 mg by mouth daily.     ferrous sulfate  325 (65 FE) MG EC tablet Take 325 mg by mouth every  other day.     fluticasone  (CUTIVATE ) 0.05 % cream Apply 1 Application topically daily as needed (irritation).     fluticasone  (FLONASE ) 50 MCG/ACT nasal spray Place 1 spray into both nostrils daily as needed for allergies or rhinitis.     HYDROcodone -acetaminophen  (NORCO) 7.5-325 MG tablet Take 1 tablet by mouth 2 (two) times daily as needed for severe pain (pain score 7-10) or moderate pain (pain score 4-6).     ibuprofen  (ADVIL ) 600 MG tablet Take 1 tablet (600 mg total) by mouth every 6 (six) hours as needed. (Patient taking differently: Take 600 mg by mouth every 6 (six) hours as needed for moderate pain (pain score 4-6) or mild pain (pain score 1-3).) 30 tablet 0   lamoTRIgine  (LAMICTAL ) 100 MG tablet Take 200 mg by mouth at bedtime.     levothyroxine  (SYNTHROID , LEVOTHROID) 112 MCG tablet Take 112 mcg by mouth daily before breakfast.      lidocaine  (XYLOCAINE ) 5 % ointment Apply 1 application   topically daily as needed for mild pain (pain score 1-3) or moderate pain (pain score 4-6).     metFORMIN  (GLUCOPHAGE -XR) 500 MG 24 hr tablet Take 1,000 mg by mouth 2 (two) times daily.     metoprolol  succinate (TOPROL -XL) 100 MG 24 hr tablet Take 100 mg by mouth at bedtime.     Norethindrone -Ethinyl Estradiol -Fe Biphas (LO LOESTRIN FE ) 1 MG-10 MCG / 10 MCG tablet Take 1 tablet by mouth daily.     nystatin cream (MYCOSTATIN) Apply 1 Application topically 2 (two) times daily as needed for dry skin.     NYSTATIN powder Apply 1 Application topically 2 (two) times daily as needed (yeast).     OLANZapine  (ZYPREXA ) 5 MG tablet Take 5 mg by mouth 2 (two) times daily as needed (Anxiety with psychosis).     ondansetron  (ZOFRAN ) 4 MG tablet Take 4 mg by mouth every 8 (eight) hours as needed for nausea/vomiting.     polyethylene glycol (MIRALAX  / GLYCOLAX ) packet Take 17 g by mouth daily as needed for mild constipation or moderate constipation.     promethazine  (PHENERGAN ) 25 MG tablet Take 1 tablet (25 mg total) by mouth every 6 (six) hours as needed for nausea or vomiting. 30 tablet 0   triamcinolone  cream (KENALOG ) 0.1 % Apply 1 Application topically 2 (two) times daily as needed (eczema).     venlafaxine  XR (EFFEXOR -XR) 150 MG 24 hr capsule Take 150 mg by mouth daily with breakfast.     vitamin B-12 (CYANOCOBALAMIN ) 1000 MCG tablet Take 1,000 mcg by mouth daily.     Allergies  Allergen Reactions   Lovenox  [Enoxaparin ] Nausea Only   Paroxetine Hcl     Hallucinations and agitation   Amoxicillin Other (See Comments)    Blisters all over Cannot take any 'cillins' at all   Demerol [Meperidine] Nausea And Vomiting and Other (See Comments)    Can not tolerate Iv administration Can tolerate IM adminstration   Haldol  [Haloperidol ] Other (See Comments)    Reverse effect   Imitrex [Sumatriptan] Other (See Comments)    Severity unknown   Macrobid [Nitrofurantoin] Nausea And Vomiting   Other Itching and  Other (See Comments)    Pink NG tape   Penicillins Other (See Comments)    Cannot not tolerate any cillins due to amoxicillin reaction    Buprenorphine  Rash   Depacon [Valproic Acid] Nausea And Vomiting and Rash   Depakote [Divalproex Sodium] Nausea And Vomiting and Rash   Dihydroergotamine Nausea  And Vomiting and Rash   Morphine And Codeine Rash and Other (See Comments)    IV form   Prednisone      Patient become nauseated and jittery   Reglan  [Metoclopramide ] Nausea And Vomiting, Rash and Other (See Comments)    React to IV form Can tolerate oral medication   Active Problems:   * No active hospital problems. *  Vitals: There were no vitals taken for this visit. Lab results:No results found for this or any previous visit (from the past 24 hours). Radiology Results: No results found. General appearance: alert, cooperative, no distress, and moderately obese Head: Normocephalic, without obvious abnormality, atraumatic Eyes: negative Nose: Nares normal. Septum midline. Mucosa normal. No drainage or sinus tenderness. Throat: Edentulous maxilla and mandible. Exposed titanium mesh left anterior maxilla with purulence. No fluctuance, edema. Pharynx clear.  Neck: no adenopathy and supple, symmetrical, trachea midline  Assessment: Infection left maxillary bone graft.  Plan: Removal hardware left maxilla.   Ascencion Lava 09/15/2023

## 2023-09-16 ENCOUNTER — Encounter (HOSPITAL_COMMUNITY)
Admission: RE | Disposition: A | Discharge status: Home / Self Care | Payer: Self-pay | Source: Home / Self Care | Attending: Oral Surgery

## 2023-09-16 ENCOUNTER — Other Ambulatory Visit: Payer: Self-pay

## 2023-09-16 ENCOUNTER — Ambulatory Visit (HOSPITAL_COMMUNITY): Payer: Self-pay | Admitting: Vascular Surgery

## 2023-09-16 ENCOUNTER — Observation Stay (HOSPITAL_COMMUNITY)
Admission: RE | Admit: 2023-09-16 | Discharge: 2023-09-18 | Disposition: A | Discharge status: Home / Self Care | Attending: Oral Surgery | Admitting: Oral Surgery

## 2023-09-16 DIAGNOSIS — K0889 Other specified disorders of teeth and supporting structures: Secondary | ICD-10-CM | POA: Diagnosis not present

## 2023-09-16 DIAGNOSIS — E039 Hypothyroidism, unspecified: Secondary | ICD-10-CM

## 2023-09-16 DIAGNOSIS — Z9889 Other specified postprocedural states: Principal | ICD-10-CM

## 2023-09-16 DIAGNOSIS — T8579XA Infection and inflammatory reaction due to other internal prosthetic devices, implants and grafts, initial encounter: Principal | ICD-10-CM | POA: Insufficient documentation

## 2023-09-16 DIAGNOSIS — E66813 Obesity, class 3: Secondary | ICD-10-CM

## 2023-09-16 DIAGNOSIS — I10 Essential (primary) hypertension: Secondary | ICD-10-CM

## 2023-09-16 DIAGNOSIS — Z6841 Body Mass Index (BMI) 40.0 and over, adult: Secondary | ICD-10-CM

## 2023-09-16 HISTORY — PX: TOOTH EXTRACTION: SHX859

## 2023-09-16 LAB — CBC
HCT: 41.5 % (ref 36.0–46.0)
Hemoglobin: 12.9 g/dL (ref 12.0–15.0)
MCH: 25.2 pg — ABNORMAL LOW (ref 26.0–34.0)
MCHC: 31.1 g/dL (ref 30.0–36.0)
MCV: 81.2 fL (ref 80.0–100.0)
Platelets: 223 10*3/uL (ref 150–400)
RBC: 5.11 MIL/uL (ref 3.87–5.11)
RDW: 13.8 % (ref 11.5–15.5)
WBC: 6.6 10*3/uL (ref 4.0–10.5)
nRBC: 0 % (ref 0.0–0.2)

## 2023-09-16 LAB — BASIC METABOLIC PANEL WITH GFR
Anion gap: 11 (ref 5–15)
BUN: 16 mg/dL (ref 6–20)
CO2: 24 mmol/L (ref 22–32)
Calcium: 9.1 mg/dL (ref 8.9–10.3)
Chloride: 103 mmol/L (ref 98–111)
Creatinine, Ser: 1.02 mg/dL — ABNORMAL HIGH (ref 0.44–1.00)
GFR, Estimated: >60 mL/min (ref >60)
Glucose, Bld: 109 mg/dL — ABNORMAL HIGH (ref 70–99)
Potassium: 3.8 mmol/L (ref 3.5–5.1)
Sodium: 138 mmol/L (ref 135–145)

## 2023-09-16 LAB — POCT PREGNANCY, URINE: Preg Test, Ur: NEGATIVE

## 2023-09-16 SURGERY — DENTAL RESTORATION/EXTRACTIONS
Anesthesia: General | Site: Mouth

## 2023-09-16 MED ORDER — CLOZAPINE 25 MG PO TABS
150.0000 mg | ORAL_TABLET | Freq: Every day | ORAL | Status: DC
  Filled 2023-09-16: qty 6

## 2023-09-16 MED ORDER — VENLAFAXINE HCL ER 150 MG PO CP24
150.0000 mg | ORAL_CAPSULE | Freq: Every day | ORAL | Status: DC
  Administered 2023-09-17 – 2023-09-18 (×2): 150 mg via ORAL
  Filled 2023-09-16 (×2): qty 1

## 2023-09-16 MED ORDER — ALBUTEROL SULFATE (2.5 MG/3ML) 0.083% IN NEBU: 2.5000 mg | INHALATION_SOLUTION | Freq: Four times a day (QID) | RESPIRATORY_TRACT | Status: DC | PRN

## 2023-09-16 MED ORDER — DEXAMETHASONE SODIUM PHOSPHATE 10 MG/ML IJ SOLN
INTRAMUSCULAR | Status: AC
  Filled 2023-09-16: qty 1

## 2023-09-16 MED ORDER — CLONAZEPAM 0.5 MG PO TABS
0.5000 mg | ORAL_TABLET | Freq: Every evening | ORAL | Status: DC | PRN
  Administered 2023-09-18: 0.5 mg via ORAL
  Filled 2023-09-16: qty 1

## 2023-09-16 MED ORDER — CHLORHEXIDINE GLUCONATE 0.12 % MT SOLN
15.0000 mL | Freq: Once | OROMUCOSAL | Status: AC
  Administered 2023-09-16: 15 mL via OROMUCOSAL
  Filled 2023-09-16: qty 15

## 2023-09-16 MED ORDER — SUGAMMADEX SODIUM 200 MG/2ML IV SOLN
INTRAVENOUS | Status: DC | PRN
  Administered 2023-09-16: 400 mg via INTRAVENOUS

## 2023-09-16 MED ORDER — CLINDAMYCIN PHOSPHATE 600 MG/50ML IV SOLN
INTRAVENOUS | Status: DC | PRN
Start: 2023-09-16 — End: 2023-09-16
  Administered 2023-09-16: 600 mg via INTRAVENOUS

## 2023-09-16 MED ORDER — ROCURONIUM BROMIDE 10 MG/ML (PF) SYRINGE
PREFILLED_SYRINGE | INTRAVENOUS | Status: AC
  Filled 2023-09-16: qty 10

## 2023-09-16 MED ORDER — BUPIVACAINE-EPINEPHRINE (PF) 0.25% -1:200000 IJ SOLN
INTRAMUSCULAR | Status: AC
Start: 2023-09-16 — End: 2023-09-16
  Filled 2023-09-16: qty 30

## 2023-09-16 MED ORDER — PROPOFOL 10 MG/ML IV BOLUS
INTRAVENOUS | Status: AC
  Filled 2023-09-16: qty 20

## 2023-09-16 MED ORDER — FLUTICASONE PROPIONATE 50 MCG/ACT NA SUSP: 1.0000 | Freq: Every day | NASAL | Status: DC | PRN

## 2023-09-16 MED ORDER — LIDOCAINE-EPINEPHRINE 2 %-1:100000 IJ SOLN: INTRAMUSCULAR | Status: DC | PRN

## 2023-09-16 MED ORDER — PROPOFOL 10 MG/ML IV BOLUS
INTRAVENOUS | Status: DC | PRN
  Administered 2023-09-16: 200 mg via INTRAVENOUS

## 2023-09-16 MED ORDER — LIDOCAINE-EPINEPHRINE 2 %-1:100000 IJ SOLN
INTRAMUSCULAR | Status: AC
  Filled 2023-09-16: qty 1

## 2023-09-16 MED ORDER — LIDOCAINE 2% (20 MG/ML) 5 ML SYRINGE
INTRAMUSCULAR | Status: DC | PRN
  Administered 2023-09-16: 100 mg via INTRAVENOUS

## 2023-09-16 MED ORDER — CALCIUM CARBONATE 1250 (500 CA) MG PO TABS
1.0000 | ORAL_TABLET | Freq: Every day | ORAL | Status: DC
  Administered 2023-09-16 – 2023-09-18 (×3): 1250 mg via ORAL
  Filled 2023-09-16 (×3): qty 1

## 2023-09-16 MED ORDER — FENTANYL CITRATE (PF) 250 MCG/5ML IJ SOLN
INTRAMUSCULAR | Status: AC
  Filled 2023-09-16: qty 5

## 2023-09-16 MED ORDER — DEXTROSE-SODIUM CHLORIDE 5-0.9 % IV SOLN: INTRAVENOUS | Status: DC

## 2023-09-16 MED ORDER — DIPHENHYDRAMINE HCL 50 MG/ML IJ SOLN: 25.0000 mg | Freq: Four times a day (QID) | INTRAMUSCULAR | Status: DC | PRN

## 2023-09-16 MED ORDER — CLOZAPINE 25 MG PO TABS: 25.0000 mg | ORAL_TABLET | Freq: Every morning | ORAL | Status: DC

## 2023-09-16 MED ORDER — METOPROLOL SUCCINATE ER 50 MG PO TB24
100.0000 mg | ORAL_TABLET | Freq: Every day | ORAL | Status: DC
  Administered 2023-09-16 – 2023-09-17 (×2): 100 mg via ORAL
  Filled 2023-09-16 (×2): qty 2

## 2023-09-16 MED ORDER — POLYETHYLENE GLYCOL 3350 17 G PO PACK
17.0000 g | PACK | Freq: Every day | ORAL | Status: DC | PRN
  Administered 2023-09-16 – 2023-09-17 (×2): 17 g via ORAL
  Filled 2023-09-16 (×2): qty 1

## 2023-09-16 MED ORDER — CLINDAMYCIN PHOSPHATE 900 MG/50ML IV SOLN
INTRAVENOUS | Status: AC
  Filled 2023-09-16: qty 50

## 2023-09-16 MED ORDER — METFORMIN HCL ER 500 MG PO TB24
1000.0000 mg | ORAL_TABLET | Freq: Two times a day (BID) | ORAL | Status: DC
  Administered 2023-09-17 – 2023-09-18 (×3): 1000 mg via ORAL
  Filled 2023-09-16 (×5): qty 2

## 2023-09-16 MED ORDER — SCOPOLAMINE 1 MG/3DAYS TD PT72
MEDICATED_PATCH | TRANSDERMAL | Status: AC
  Administered 2023-09-16: 1.5 mg via TRANSDERMAL
  Filled 2023-09-16: qty 1

## 2023-09-16 MED ORDER — CLOZAPINE 100 MG PO TABS
150.0000 mg | ORAL_TABLET | Freq: Every day | ORAL | Status: DC
  Administered 2023-09-16 – 2023-09-17 (×2): 150 mg via ORAL
  Filled 2023-09-16 (×3): qty 2

## 2023-09-16 MED ORDER — HYDROMORPHONE HCL 1 MG/ML IJ SOLN
0.2500 mg | INTRAMUSCULAR | Status: DC | PRN
  Administered 2023-09-16 (×2): 0.5 mg via INTRAVENOUS

## 2023-09-16 MED ORDER — ACETAMINOPHEN 500 MG PO TABS
1000.0000 mg | ORAL_TABLET | Freq: Four times a day (QID) | ORAL | Status: DC
  Administered 2023-09-17 – 2023-09-18 (×5): 1000 mg via ORAL
  Filled 2023-09-16 (×7): qty 2

## 2023-09-16 MED ORDER — OXYCODONE HCL 5 MG/5ML PO SOLN: 5.0000 mg | Freq: Once | ORAL | Status: DC | PRN

## 2023-09-16 MED ORDER — 0.9 % SODIUM CHLORIDE (POUR BTL) OPTIME
TOPICAL | Status: DC | PRN
  Administered 2023-09-16: 1000 mL

## 2023-09-16 MED ORDER — DIPHENHYDRAMINE HCL 25 MG PO CAPS
25.0000 mg | ORAL_CAPSULE | Freq: Four times a day (QID) | ORAL | Status: DC | PRN
Start: 2023-09-16 — End: 2023-09-18

## 2023-09-16 MED ORDER — ONDANSETRON HCL 4 MG/2ML IJ SOLN
INTRAMUSCULAR | Status: AC
  Filled 2023-09-16: qty 2

## 2023-09-16 MED ORDER — ONDANSETRON 4 MG PO TBDP
4.0000 mg | ORAL_TABLET | Freq: Four times a day (QID) | ORAL | Status: DC | PRN
  Administered 2023-09-18: 4 mg via ORAL
  Filled 2023-09-16 (×2): qty 1

## 2023-09-16 MED ORDER — ONDANSETRON HCL 4 MG PO TABS: 4.0000 mg | ORAL_TABLET | Freq: Three times a day (TID) | ORAL | Status: DC | PRN

## 2023-09-16 MED ORDER — MIDAZOLAM HCL 2 MG/2ML IJ SOLN
INTRAMUSCULAR | Status: AC
Start: 2023-09-16 — End: 2023-09-16
  Filled 2023-09-16: qty 2

## 2023-09-16 MED ORDER — MIDAZOLAM HCL 2 MG/2ML IJ SOLN
INTRAMUSCULAR | Status: DC | PRN
  Administered 2023-09-16: 2 mg via INTRAVENOUS

## 2023-09-16 MED ORDER — LAMOTRIGINE 100 MG PO TABS
200.0000 mg | ORAL_TABLET | Freq: Every day | ORAL | Status: DC
  Administered 2023-09-16 – 2023-09-17 (×2): 200 mg via ORAL
  Filled 2023-09-16: qty 2
  Filled 2023-09-16: qty 8

## 2023-09-16 MED ORDER — CHLORHEXIDINE GLUCONATE 0.12 % MT SOLN: 15.0000 mL | Freq: Two times a day (BID) | OROMUCOSAL | Status: DC

## 2023-09-16 MED ORDER — OXYCODONE HCL 5 MG PO TABS
5.0000 mg | ORAL_TABLET | ORAL | Status: DC | PRN
  Administered 2023-09-16 – 2023-09-18 (×5): 10 mg via ORAL
  Filled 2023-09-16 (×5): qty 2

## 2023-09-16 MED ORDER — DOXAZOSIN MESYLATE 2 MG PO TABS
2.0000 mg | ORAL_TABLET | Freq: Every day | ORAL | Status: DC
  Administered 2023-09-16 – 2023-09-17 (×2): 2 mg via ORAL
  Filled 2023-09-16 (×4): qty 1

## 2023-09-16 MED ORDER — OXYCODONE HCL 5 MG PO TABS: 5.0000 mg | ORAL_TABLET | Freq: Once | ORAL | Status: DC | PRN

## 2023-09-16 MED ORDER — SCOPOLAMINE 1 MG/3DAYS TD PT72: 1.0000 | MEDICATED_PATCH | TRANSDERMAL | Status: DC

## 2023-09-16 MED ORDER — ONDANSETRON HCL 4 MG/2ML IJ SOLN: 4.0000 mg | Freq: Once | INTRAMUSCULAR | Status: DC | PRN

## 2023-09-16 MED ORDER — HYDROMORPHONE HCL 1 MG/ML IJ SOLN
1.0000 mg | INTRAMUSCULAR | Status: DC | PRN
  Administered 2023-09-16 – 2023-09-17 (×6): 1 mg via INTRAVENOUS
  Filled 2023-09-16 (×8): qty 1

## 2023-09-16 MED ORDER — ACETAMINOPHEN 10 MG/ML IV SOLN
INTRAVENOUS | Status: AC
  Filled 2023-09-16: qty 100

## 2023-09-16 MED ORDER — ORAL CARE MOUTH RINSE: 15.0000 mL | Freq: Once | OROMUCOSAL | Status: AC

## 2023-09-16 MED ORDER — OLANZAPINE 5 MG PO TABS: 5.0000 mg | ORAL_TABLET | Freq: Two times a day (BID) | ORAL | Status: DC | PRN

## 2023-09-16 MED ORDER — NORETHIN-ETH ESTRAD-FE BIPHAS 1 MG-10 MCG / 10 MCG PO TABS: 1.0000 | ORAL_TABLET | Freq: Every day | ORAL | Status: DC

## 2023-09-16 MED ORDER — CLINDAMYCIN PHOSPHATE 600 MG/50ML IV SOLN
600.0000 mg | Freq: Four times a day (QID) | INTRAVENOUS | Status: DC
  Administered 2023-09-16 – 2023-09-18 (×7): 600 mg via INTRAVENOUS
  Filled 2023-09-16 (×8): qty 50

## 2023-09-16 MED ORDER — ACETAMINOPHEN 10 MG/ML IV SOLN
1000.0000 mg | Freq: Once | INTRAVENOUS | Status: DC | PRN
  Administered 2023-09-16: 1000 mg via INTRAVENOUS

## 2023-09-16 MED ORDER — CLOZAPINE 100 MG PO TABS
200.0000 mg | ORAL_TABLET | Freq: Every day | ORAL | Status: DC
  Administered 2023-09-17: 200 mg via ORAL
  Filled 2023-09-16 (×2): qty 2

## 2023-09-16 MED ORDER — OXYMETAZOLINE HCL 0.05 % NA SOLN
NASAL | Status: AC
  Filled 2023-09-16: qty 30

## 2023-09-16 MED ORDER — ONDANSETRON HCL 4 MG/2ML IJ SOLN
INTRAMUSCULAR | Status: DC | PRN
  Administered 2023-09-16: 4 mg via INTRAVENOUS

## 2023-09-16 MED ORDER — HYDROMORPHONE HCL 1 MG/ML IJ SOLN
INTRAMUSCULAR | Status: AC
  Filled 2023-09-16: qty 1

## 2023-09-16 MED ORDER — FERROUS SULFATE 325 (65 FE) MG PO TABS
325.0000 mg | ORAL_TABLET | ORAL | Status: DC
  Filled 2023-09-16 (×3): qty 1

## 2023-09-16 MED ORDER — ONDANSETRON HCL 4 MG/2ML IJ SOLN
4.0000 mg | Freq: Four times a day (QID) | INTRAMUSCULAR | Status: DC | PRN
  Administered 2023-09-16 – 2023-09-18 (×6): 4 mg via INTRAVENOUS
  Filled 2023-09-16 (×6): qty 2

## 2023-09-16 MED ORDER — METOPROLOL TARTRATE 5 MG/5ML IV SOLN: 5.0000 mg | Freq: Four times a day (QID) | INTRAVENOUS | Status: DC | PRN

## 2023-09-16 MED ORDER — CYCLOBENZAPRINE HCL 10 MG PO TABS
10.0000 mg | ORAL_TABLET | Freq: Three times a day (TID) | ORAL | Status: DC | PRN
  Administered 2023-09-17 – 2023-09-18 (×3): 10 mg via ORAL
  Filled 2023-09-16: qty 2
  Filled 2023-09-16 (×2): qty 1

## 2023-09-16 MED ORDER — LACTATED RINGERS IV SOLN: INTRAVENOUS | Status: DC

## 2023-09-16 MED ORDER — SODIUM CHLORIDE 0.9 % IR SOLN
Status: DC | PRN
  Administered 2023-09-16: 1000 mL

## 2023-09-16 MED ORDER — AMISULPRIDE (ANTIEMETIC) 5 MG/2ML IV SOLN: 10.0000 mg | Freq: Once | INTRAVENOUS | Status: DC | PRN

## 2023-09-16 MED ORDER — BUPIVACAINE-EPINEPHRINE (PF) 0.25% -1:200000 IJ SOLN
INTRAMUSCULAR | Status: DC | PRN
  Administered 2023-09-16: 20 mL via PERINEURAL

## 2023-09-16 MED ORDER — DEXAMETHASONE SODIUM PHOSPHATE 10 MG/ML IJ SOLN
INTRAMUSCULAR | Status: DC | PRN
  Administered 2023-09-16: 10 mg via INTRAVENOUS

## 2023-09-16 MED ORDER — FENTANYL CITRATE (PF) 250 MCG/5ML IJ SOLN
INTRAMUSCULAR | Status: DC | PRN
  Administered 2023-09-16: 200 ug via INTRAVENOUS
  Administered 2023-09-16: 50 ug via INTRAVENOUS

## 2023-09-16 MED ORDER — VITAMIN B-12 1000 MCG PO TABS
1000.0000 ug | ORAL_TABLET | Freq: Every day | ORAL | Status: DC
  Administered 2023-09-16 – 2023-09-18 (×3): 1000 ug via ORAL
  Filled 2023-09-16 (×3): qty 1

## 2023-09-16 MED ORDER — DICYCLOMINE HCL 10 MG PO CAPS: 10.0000 mg | ORAL_CAPSULE | Freq: Every day | ORAL | Status: DC | PRN

## 2023-09-16 MED ORDER — LORATADINE 10 MG PO TABS
10.0000 mg | ORAL_TABLET | Freq: Every day | ORAL | Status: DC
  Administered 2023-09-16 – 2023-09-18 (×3): 10 mg via ORAL
  Filled 2023-09-16 (×3): qty 1

## 2023-09-16 MED ORDER — BUPRENORPHINE 15 MCG/HR TD PTWK: 1.0000 | MEDICATED_PATCH | TRANSDERMAL | Status: DC

## 2023-09-16 MED ORDER — ROCURONIUM BROMIDE 10 MG/ML (PF) SYRINGE
PREFILLED_SYRINGE | INTRAVENOUS | Status: DC | PRN
  Administered 2023-09-16: 70 mg via INTRAVENOUS

## 2023-09-16 MED ORDER — CHLORHEXIDINE GLUCONATE 0.12 % MT SOLN
15.0000 mL | Freq: Two times a day (BID) | OROMUCOSAL | Status: DC
  Administered 2023-09-16 – 2023-09-18 (×4): 15 mL via OROMUCOSAL
  Filled 2023-09-16 (×6): qty 15

## 2023-09-16 MED ORDER — CLOZAPINE 25 MG PO TABS
175.0000 mg | ORAL_TABLET | Freq: Every day | ORAL | Status: DC
  Administered 2023-09-17 – 2023-09-18 (×2): 175 mg via ORAL
  Filled 2023-09-16 (×2): qty 3

## 2023-09-16 MED ORDER — LEVOTHYROXINE SODIUM 112 MCG PO TABS
112.0000 ug | ORAL_TABLET | Freq: Every day | ORAL | Status: DC
  Administered 2023-09-17 – 2023-09-18 (×2): 112 ug via ORAL
  Filled 2023-09-16 (×2): qty 1

## 2023-09-16 SURGICAL SUPPLY — 26 items
BAG COUNTER SPONGE SURGICOUNT (BAG) IMPLANT
BLADE SURG 15 STRL LF DISP TIS (BLADE) ×2 IMPLANT
BUR CROSS CUT FISSURE 1.6 (BURR) ×2 IMPLANT
BUR EGG ELITE 4.0 (BURR) IMPLANT
CANISTER SUCTION 3000ML PPV (SUCTIONS) ×2 IMPLANT
COVER SURGICAL LIGHT HANDLE (MISCELLANEOUS) ×2 IMPLANT
GAUZE PACKING FOLDED 2 STR (GAUZE/BANDAGES/DRESSINGS) ×2 IMPLANT
GLOVE BIO SURGEON STRL SZ8 (GLOVE) ×2 IMPLANT
GOWN STRL REUS W/ TWL LRG LVL3 (GOWN DISPOSABLE) ×2 IMPLANT
GOWN STRL REUS W/ TWL XL LVL3 (GOWN DISPOSABLE) ×2 IMPLANT
IV NS 1000ML BAXH (IV SOLUTION) ×2 IMPLANT
KIT BASIN OR (CUSTOM PROCEDURE TRAY) ×2 IMPLANT
KIT TURNOVER KIT B (KITS) ×2 IMPLANT
NDL HYPO 25GX1X1/2 BEV (NEEDLE) ×4 IMPLANT
NEEDLE HYPO 25GX1X1/2 BEV (NEEDLE) ×2 IMPLANT
NS IRRIG 1000ML POUR BTL (IV SOLUTION) ×2 IMPLANT
PAD ARMBOARD POSITIONER FOAM (MISCELLANEOUS) ×2 IMPLANT
SLEEVE IRRIGATION ELITE 7 (MISCELLANEOUS) ×2 IMPLANT
SPIKE FLUID TRANSFER (MISCELLANEOUS) IMPLANT
SUT CHROMIC 3 0 SH 27 (SUTURE) IMPLANT
SUT PLAIN 3 0 PS2 27 (SUTURE) IMPLANT
SYR BULB IRRIG 60ML STRL (SYRINGE) ×2 IMPLANT
SYR CONTROL 10ML LL (SYRINGE) ×2 IMPLANT
TRAY ENT MC OR (CUSTOM PROCEDURE TRAY) ×2 IMPLANT
TUBING IRRIGATION (MISCELLANEOUS) ×2 IMPLANT
YANKAUER SUCT BULB TIP NO VENT (SUCTIONS) ×2 IMPLANT

## 2023-09-16 NOTE — Anesthesia Postprocedure Evaluation (Signed)
 Anesthesia Post Note  Patient: ROBIE OATS  Procedure(s) Performed: DENTAL RESTORATION/EXTRACTIONS (Mouth)     Patient location during evaluation: PACU Anesthesia Type: General Level of consciousness: awake and alert Pain management: pain level controlled Vital Signs Assessment: post-procedure vital signs reviewed and stable Respiratory status: spontaneous breathing, nonlabored ventilation, respiratory function stable and patient connected to nasal cannula oxygen Cardiovascular status: blood pressure returned to baseline and stable Postop Assessment: no apparent nausea or vomiting Anesthetic complications: no  No notable events documented.  Last Vitals:  Vitals:   09/16/23 1200 09/16/23 1229  BP:  (!) 147/105  Pulse:    Resp:  18  Temp: 36.6 C 36.8 C  SpO2:  97%    Last Pain:  Vitals:   09/16/23 1315  TempSrc:   PainSc: 10-Worst pain ever                 Rosalita Combe

## 2023-09-16 NOTE — TOC CM/SW Note (Signed)
 Transition of Care [facility]) - Inpatient Brief Assessment   Patient Details  Name: OANH DEVIVO MRN: [id number] Date of Birth: Oct 28, 1985  [facility] CM/SW Contact:    Juliane Och, LCSW Phone Number: 09/16/2023, 3:30 PM   Clinical Narrative:  3:30 PM Per chart review, patient resides at home with parent. Patient has a PCP and insurance. Patient does not have SNF/HH/DME history. Patient's preferred pharmacy [facility] [phone] [facility]. No TOC needs were identified at this time. TOC will continue to follow and be available to assist.  Transition of Care Asessment: Insurance and Status: Insurance coverage has been reviewed Patient has primary care physician: Yes Home environment has been reviewed: Private Residence Prior level of function:: N/A Prior/Current Home Services: No current home services Social Drivers of Health Review: SDOH reviewed no interventions necessary (SDOH responses from May 14th, 2025) Readmission risk has been reviewed: Yes Transition of care needs: no transition of care needs at this time

## 2023-09-16 NOTE — H&P (Signed)
 H&P documentation  -History and Physical Reviewed  -Patient has been re-examined  -No change in the plan of care  Ashlee Day

## 2023-09-16 NOTE — Transfer of Care (Signed)
 Immediate Anesthesia Transfer of Care Note  Patient: Ashlee Day  Procedure(s) Performed: DENTAL RESTORATION/EXTRACTIONS (Mouth)  Patient Location: PACU  Anesthesia Type:General  Level of Consciousness: awake and alert   Airway & Oxygen Therapy: Patient Spontanous Breathing and Patient connected to face mask oxygen  Post-op Assessment: Report given to RN and Post -op Vital signs reviewed and stable  Post vital signs: Reviewed and stable  Last Vitals:  Vitals Value Taken Time  BP 135/91 09/16/23 11:22  Temp 36.7 C 09/16/23 11:20  Pulse 84 09/16/23 11:27  Resp 17 09/16/23 11:27  SpO2 99 % 09/16/23 11:27  Vitals shown include unfiled device data.  Last Pain:  Vitals:   09/16/23 1120  TempSrc:   PainSc: Asleep      Patients Stated Pain Goal: 2 (09/16/23 0742)  Complications: No notable events documented.

## 2023-09-16 NOTE — CV Procedure (Signed)
 09/16/2023  11:03 AM  PATIENT:  Ashlee Day  38 y.o. female  PRE-OPERATIVE DIAGNOSIS:  Titanium mesh hardware left maxilla  POST-OPERATIVE DIAGNOSIS:  SAME  PROCEDURE:  Procedure(s): Removal hardware left maxilla  SURGEON:  Surgeon(s): Ascencion Lava, DMD  ANESTHESIA:   local and general  EBL:  minimal  DRAINS: none   SPECIMEN:  No Specimen  COUNTS:  YES  PLAN OF CARE: 23 h obs  PATIENT DISPOSITION:  PACU - hemodynamically stable.   PROCEDURE DETAILS: Dictation #[id number]  Ashlee Day, DMD 09/16/2023 11:03 AM

## 2023-09-16 NOTE — Anesthesia Procedure Notes (Signed)
 Procedure Name: Intubation Date/Time: 09/16/2023 10:25 AM  Performed by: Leandro Proffer, CRNAPre-anesthesia Checklist: Patient identified, Emergency Drugs available, Suction available and Patient being monitored Patient Re-evaluated:Patient Re-evaluated prior to induction Oxygen Delivery Method: Circle system utilized Preoxygenation: Pre-oxygenation with 100% oxygen Induction Type: IV induction Ventilation: Mask ventilation without difficulty Laryngoscope Size: Mac and 4 Grade View: Grade I Tube type: Oral Rae Tube size: 7.0 mm Number of attempts: 1 Airway Equipment and Method: Stylet and Oral airway Placement Confirmation: ETT inserted through vocal cords under direct vision, positive ETCO2 and breath sounds checked- equal and bilateral Secured at: 21 cm Tube secured with: Tape Dental Injury: Teeth and Oropharynx as per pre-operative assessment

## 2023-09-16 NOTE — Care Management Obs Status (Signed)
 MEDICARE OBSERVATION STATUS NOTIFICATION   Patient Details  Name: NIKOLA BLACKSTON MRN: [id number] Date of Birth: 12-09-1985   Medicare Observation Status Notification Given:  Yes Letter signed and copy given    Wynonia Hedges 09/16/2023, 4:01 PM

## 2023-09-16 NOTE — Op Note (Unsigned)
 [name], ZUPKO MEDICAL RECORD NO: [id number] ACCOUNT NO: [id number] DATE OF BIRTH: [birth year] FACILITY: [facility] LOCATION: [facility] PHYSICIAN: Cornelia Dieter, DDS  Operative Report   DATE OF PROCEDURE: 09/16/2023  PREOPERATIVE DIAGNOSIS: Titanium mesh hardware, left maxilla, suspected infection.  POSTOPERATIVE DIAGNOSIS: Titanium mesh hardware, left maxilla, suspected infection.  PROCEDURE: Removal of hardware, left maxilla.  SURGEON: Cornelia Dieter, DDS  ANESTHESIAOtis Blocker, oral intubation general anesthesia.  DESCRIPTION OF PROCEDURE: The patient had a bone graft and placement of titanium mesh in 07/2023. Over the past 2 months, the titanium mesh has become exposed and there appeared to be purulence around the edges of it. It was recommended that titanium  mesh be removed to avoid damage to the underlying bone.   The patient was taken to the operating room and placed on the table in supine position. General anesthesia was administered. An oral endotracheal tube was placed and secured. The eyes were protected. The patient was draped for surgery. Timeout was  performed. The posterior pharynx was suctioned and a throat pack was placed. 0.5% Marcaine  with 1:200,000 epinephrine  was infiltrated buccally and palatally in the left maxilla. The titanium mesh was exposed approximately 1.5 cm x 1 cm. A 15 blade was  used to make incisions vertically at an angle into the maxillary vestibule to allow for a wide-based flap. The periosteum was reflected off the titanium mesh. The bone screws were removed with a screwdriver and the titanium mesh was removed from the  mouth. The underlying tissue was examined. It was found to have good form. There were a few small particles of bone graft that were loose, but overall the operative site looked good. The tissue was undermined to allow for primary closure and tissue was  trimmed as there was fibrous tissue due to repeated surgery in the area. Then the area  was irrigated and closed with 3-0 Vicryl and 3-0 chromic. The oral cavity was then irrigated and suctioned. Additional Marcaine  was given. The throat pack was removed  and the patient was left in the care of anesthesia for extubation, transported to recovery, and 23-hour observation.   ESTIMATED BLOOD LOSS: Minimal.  COMPLICATIONS: None.  SPECIMENS: None.  COUNTS: Correct.    SUJ D: 09/16/2023 11:06:40 am T: 09/16/2023 11:25:00 pm  JOB: [id number]/ [id number]

## 2023-09-17 DIAGNOSIS — T8579XA Infection and inflammatory reaction due to other internal prosthetic devices, implants and grafts, initial encounter: Secondary | ICD-10-CM | POA: Diagnosis not present

## 2023-09-17 MED ORDER — HYDROMORPHONE HCL 1 MG/ML IJ SOLN
0.5000 mg | INTRAMUSCULAR | Status: DC | PRN
  Administered 2023-09-17 – 2023-09-18 (×8): 0.5 mg via INTRAVENOUS
  Filled 2023-09-17 (×3): qty 1
  Filled 2023-09-17: qty 0.5
  Filled 2023-09-17 (×5): qty 1

## 2023-09-17 NOTE — Plan of Care (Signed)

## 2023-09-17 NOTE — Progress Notes (Signed)
 [facility] PROGRESS NOTE:   SUBJECTIVE: Pain 9/10.   OBJECTIVE:  Vitals: Blood pressure (!) 134/90, pulse 71, temperature 98.1 F (36.7 C), temperature source Oral, resp. rate 17, height 5' 2 (1.575 m), weight 102.1 kg, last menstrual period 09/07/2023, SpO2 96%. Lab results:No results found for this or any previous visit (from the past 24 hours). Radiology Results: No results found. General appearance: alert, cooperative, no distress, and moderately obese Head: Normocephalic, without obvious abnormality, atraumatic Eyes: negative Nose: Nares normal. Septum midline. Mucosa normal. No drainage or sinus tenderness. Throat: Mild edema. Sutures intact. No trismus. Pharynx clear. Neck: no adenopathy and supple, symmetrical, trachea midline  ASSESSMENT: Stable s/p hardware removal left maxilla.  PLAN: D/C IV, change to hep lock. Encourager PO intake. Continue pain control. Wean off IV meds as possible. D/C tomorrow if improved.   Ascencion Lava 09/17/2023

## 2023-09-18 DIAGNOSIS — T8579XA Infection and inflammatory reaction due to other internal prosthetic devices, implants and grafts, initial encounter: Secondary | ICD-10-CM | POA: Diagnosis not present

## 2023-09-18 MED ORDER — ONDANSETRON 4 MG PO TBDP: 4.0000 mg | ORAL_TABLET | Freq: Four times a day (QID) | ORAL | 0 refills | Status: AC | PRN

## 2023-09-18 MED ORDER — FLUCONAZOLE 100 MG PO TABS: 100.0000 mg | ORAL_TABLET | Freq: Every day | ORAL | 0 refills | Status: AC

## 2023-09-18 MED ORDER — CLINDAMYCIN HCL 300 MG PO CAPS: 300.0000 mg | ORAL_CAPSULE | Freq: Three times a day (TID) | ORAL | 0 refills | Status: AC

## 2023-09-18 MED ORDER — OXYCODONE HCL 5 MG PO TABS: 5.0000 mg | ORAL_TABLET | ORAL | 0 refills | Status: AC | PRN

## 2023-09-18 NOTE — Discharge Summary (Signed)
 Physician Discharge Summary  Patient ID: Ashlee Day MRN: [id number] DOB/AGE: 05/11/1985 37 y.o.  Admit date: 09/16/2023 Discharge date: 09/18/2023  Admission Diagnoses: Post-operative State  Discharge Diagnoses:  Principal Problem:   Post-operative state   Discharged Condition: good  Hospital Course: Patient admitted for pain control and IV antibiotics after oral surgery procedure 6/13/20254.  Consults: None  Significant Diagnostic Studies: pre op labs  Treatments: surgery: Maxillary hardware removal.   Discharge Exam: Blood pressure 99/62, pulse 79, temperature 98.3 F (36.8 C), temperature source Oral, resp. rate 18, height 5' 2 (1.575 m), weight 102.1 kg, last menstrual period 09/07/2023, SpO2 98%. General appearance: alert, cooperative, fatigued, and no distress Head: Normocephalic, without obvious abnormality, atraumatic Eyes: negative Nose: Nares normal. Septum midline. Mucosa normal. No drainage or sinus tenderness. Throat: Mild facial edema. Oral: Sutures intact, gingiva pink. No trismus, pharynx clear.  Neck: no adenopathy and thyroid  not enlarged, symmetric, no tenderness/mass/nodules  Disposition: Discharge disposition: 01-Home or Self Care       Discharge Instructions     Call MD for:  difficulty breathing, headache or visual disturbances   Complete by: As directed    Call MD for:  persistant nausea and vomiting   Complete by: As directed    Call MD for:  severe uncontrolled pain   Complete by: As directed    Call MD for:  temperature >100.4   Complete by: As directed    Diet - low sodium heart healthy   Complete by: As directed    Discharge instructions   Complete by: As directed    Take prescription pain medicine as needed. May add 1 Tylenol  and/or 2 ibuprofen  if additional pain relief needed. Try to discontinue prescription pain medicine, as it is a narcotic, and use only Tylenol  and Ibuprofen  (may take - 2 Tylenols and 2 Ibuprofen  together  every 4 hours). Ice to affected area for 2-3 days. Off/on every 30 minutes while awake  Soft diet, advance as tolerated. Warm salt water  mouth rinses 4-5 times per day starting the day after surgery. For problems, call [phone].   Discharge wound care:   Complete by: As directed    Salt water  mouth rinses 2-3x per day   Increase activity slowly   Complete by: As directed       Allergies as of 09/18/2023       Reactions   Lovenox  [enoxaparin ] Nausea Only   Paroxetine Hcl    Hallucinations and agitation   Amoxicillin Other (See Comments)   Blisters all over Cannot take any 'cillins' at all   Demerol [meperidine] Nausea And Vomiting, Other (See Comments)   Can not tolerate Iv administration Can tolerate IM adminstration   Haldol  [haloperidol ] Other (See Comments)   Reverse effect   Imitrex [sumatriptan] Other (See Comments)   Severity unknown   Macrobid [nitrofurantoin] Nausea And Vomiting   Other Itching, Other (See Comments)   Pink NG tape   Penicillins Other (See Comments)   Cannot not tolerate any cillins due to amoxicillin reaction   Buprenorphine  Rash   Depacon [valproic Acid] Nausea And Vomiting, Rash   Depakote [divalproex Sodium] Nausea And Vomiting, Rash   Dihydroergotamine Nausea And Vomiting, Rash   Morphine And Codeine Rash, Other (See Comments)   IV form   Prednisone     Patient become nauseated and jittery   Reglan  [metoclopramide ] Nausea And Vomiting, Rash, Other (See Comments)   React to IV form Can tolerate oral medication  Medication List     STOP taking these medications    HYDROcodone -acetaminophen  7.5-325 MG tablet Commonly known as: NORCO       TAKE these medications    albuterol  108 (90 Base) MCG/ACT inhaler Commonly known as: VENTOLIN  HFA Inhale 2 puffs into the lungs every 6 (six) hours as needed for wheezing or shortness of breath.   buprenorphine  15 MCG/HR Commonly known as: BUTRANS  Place 1 patch onto the skin once a  week.   calcium  carbonate 600 MG Tabs tablet Commonly known as: OS-CAL Take 2 tablets by mouth daily.   cetirizine 10 MG tablet Commonly known as: ZYRTEC Take 10 mg by mouth daily.   chlorhexidine  0.12 % solution Commonly known as: PERIDEX  Use as directed 15 mLs in the mouth or throat 2 (two) times daily.   chlorhexidine  0.12 % solution Commonly known as: Peridex  Use as directed 15 mLs in the mouth or throat 2 (two) times daily.   clindamycin  300 MG capsule Commonly known as: CLEOCIN  Take 1 capsule (300 mg total) by mouth 3 (three) times daily.   clonazePAM  0.5 MG tablet Commonly known as: KLONOPIN  Take 0.25 mg by mouth at bedtime as needed (sleep).   cloZAPine  100 MG tablet Commonly known as: CLOZARIL  Take 150-200 mg by mouth See admin instructions. Take 150 mg in the morning, take with 25 mg  for a total of 175 mg in the morning, 200 mg  in the afternoon & 150 mg bedtime   cloZAPine  25 MG tablet Commonly known as: CLOZARIL  Take 25 mg by mouth in the morning.   cyanocobalamin  1000 MCG tablet Commonly known as: VITAMIN B12 Take 1,000 mcg by mouth daily.   cyclobenzaprine  10 MG tablet Commonly known as: FLEXERIL  Take 10 mg by mouth 3 (three) times daily as needed for muscle spasms.   diclofenac Sodium 1 % Gel Commonly known as: VOLTAREN Apply 2 g topically daily as needed (for pain).   dicyclomine  10 MG capsule Commonly known as: BENTYL  Take 10 mg by mouth daily as needed for spasms.   doxazosin  2 MG tablet Commonly known as: CARDURA  Take 2 mg by mouth every evening.   ferrous sulfate  325 (65 FE) MG EC tablet Take 325 mg by mouth every other day.   fluconazole  100 MG tablet Commonly known as: Diflucan  Take 1 tablet (100 mg total) by mouth daily for 3 days. As needed for yeast infection   fluticasone  50 MCG/ACT nasal spray Commonly known as: FLONASE  Place 1 spray into both nostrils daily as needed for allergies or rhinitis.   ibuprofen  600 MG  tablet Commonly known as: ADVIL  Take 600 mg by mouth every 6 (six) hours as needed for mild pain (pain score 1-3).   LACTOBACILLUS PO Take 1 tablet by mouth every evening.   lamoTRIgine  100 MG tablet Commonly known as: LAMICTAL  Take 200 mg by mouth at bedtime.   levothyroxine  112 MCG tablet Commonly known as: SYNTHROID  Take 112 mcg by mouth daily before breakfast.   lidocaine  5 % ointment Commonly known as: XYLOCAINE  Apply 1 Application topically daily as needed for mild pain (pain score 1-3).   metFORMIN  500 MG 24 hr tablet Commonly known as: GLUCOPHAGE -XR Take 500 mg by mouth 2 (two) times daily.   metoprolol  succinate 100 MG 24 hr tablet Commonly known as: TOPROL -XL Take 100 mg by mouth at bedtime.   Norethindrone -Ethinyl Estradiol -Fe Biphas 1 MG-10 MCG / 10 MCG tablet Commonly known as: LO LOESTRIN FE  Take 1 tablet by mouth every  evening.   nystatin powder Commonly known as: MYCOSTATIN/NYSTOP Apply 1 Application topically 2 (two) times daily as needed (for dryness).   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   ondansetron  4 MG disintegrating tablet Commonly known as: ZOFRAN -ODT Take 1 tablet (4 mg total) by mouth every 6 (six) hours as needed for nausea.   ondansetron  4 MG tablet Commonly known as: ZOFRAN  Take 4 mg by mouth every 8 (eight) hours as needed for nausea/vomiting.   oxyCODONE  5 MG immediate release tablet Commonly known as: Roxicodone  Take 1-2 tablets (5-10 mg total) by mouth every 4 (four) hours as needed for severe pain (pain score 7-10). Take 1-2 tabs q 4 hours prn pain   polyethylene glycol 17 g packet Commonly known as: MIRALAX  / GLYCOLAX  Take 17 g by mouth daily as needed for mild constipation or moderate constipation.   torsemide 10 MG tablet Commonly known as: DEMADEX Take 10 mg by mouth daily as needed (for hand swelling).   venlafaxine  XR 150 MG 24  hr capsule Commonly known as: EFFEXOR -XR Take 150 mg by mouth daily with breakfast.               Discharge Care Instructions  (From admission, onward)           Start     Ordered   09/18/23 0000  Discharge wound care:       Comments: Salt water  mouth rinses 2-3x per day   09/18/23 1100            Follow-up Information     [facility].   Contact information: [address] [place] [address] [phone]         Ascencion Lava, DMD Follow up in 1 week(s).   Specialty: Oral Surgery Why: For wound re-check, For suture removal Contact information: [address] [place] [address] [phone]                 Signed: Ascencion Lava 09/18/2023, 11:01 AM

## 2023-09-18 NOTE — Progress Notes (Signed)
 DISCHARGE NOTE HOME JOCABED CHEESE to be discharged Home per MD order. Discussed prescriptions and follow up appointments with the patient. Prescriptions given to patient; medication list explained in detail. Patient verbalized understanding.  Skin clean, dry and intact without evidence of skin break down, no evidence of skin tears noted. IV catheter discontinued intact. Site without signs and symptoms of complications. Dressing and pressure applied. Pt denies pain at the site currently. No complaints noted.  Patient free of lines, drains, and wounds.   An After Visit Summary (AVS) was printed and given to the patient. Patient escorted via wheelchair, and discharged home via private auto.  Tonda Francisco, RN

## 2023-09-19 ENCOUNTER — Encounter (HOSPITAL_COMMUNITY): Payer: Self-pay | Admitting: Oral Surgery
